# Patient Record
Sex: Female | Born: 1985 | Race: White | Hispanic: No | Marital: Married | Smoking: Former smoker
Health system: Southern US, Community
[De-identification: ages and names within clinical notes are randomized; demographics above are authoritative.]

## PROBLEM LIST (undated history)

## (undated) ENCOUNTER — Inpatient Hospital Stay (HOSPITAL_COMMUNITY): Payer: Self-pay

## (undated) DIAGNOSIS — N390 Urinary tract infection, site not specified: Secondary | ICD-10-CM

## (undated) DIAGNOSIS — M549 Dorsalgia, unspecified: Secondary | ICD-10-CM

## (undated) DIAGNOSIS — O24419 Gestational diabetes mellitus in pregnancy, unspecified control: Secondary | ICD-10-CM

## (undated) DIAGNOSIS — N3090 Cystitis, unspecified without hematuria: Secondary | ICD-10-CM

## (undated) DIAGNOSIS — E559 Vitamin D deficiency, unspecified: Secondary | ICD-10-CM

## (undated) DIAGNOSIS — I639 Cerebral infarction, unspecified: Secondary | ICD-10-CM

## (undated) DIAGNOSIS — Z9889 Other specified postprocedural states: Secondary | ICD-10-CM

## (undated) DIAGNOSIS — K219 Gastro-esophageal reflux disease without esophagitis: Secondary | ICD-10-CM

## (undated) DIAGNOSIS — R42 Dizziness and giddiness: Secondary | ICD-10-CM

## (undated) DIAGNOSIS — R000 Tachycardia, unspecified: Secondary | ICD-10-CM

## (undated) DIAGNOSIS — J302 Other seasonal allergic rhinitis: Secondary | ICD-10-CM

## (undated) DIAGNOSIS — R112 Nausea with vomiting, unspecified: Secondary | ICD-10-CM

## (undated) DIAGNOSIS — E7800 Pure hypercholesterolemia, unspecified: Secondary | ICD-10-CM

## (undated) DIAGNOSIS — R51 Headache: Secondary | ICD-10-CM

## (undated) DIAGNOSIS — I10 Essential (primary) hypertension: Secondary | ICD-10-CM

## (undated) DIAGNOSIS — R202 Paresthesia of skin: Secondary | ICD-10-CM

## (undated) HISTORY — DX: Urinary tract infection, site not specified: N39.0

## (undated) HISTORY — PX: ABDOMINAL HYSTERECTOMY: SHX81

## (undated) HISTORY — DX: Tachycardia, unspecified: R00.0

## (undated) HISTORY — PX: BREAST LUMPECTOMY: SHX2

## (undated) HISTORY — PX: ADENOIDECTOMY: SUR15

## (undated) HISTORY — DX: Dorsalgia, unspecified: M54.9

## (undated) HISTORY — DX: Essential (primary) hypertension: I10

## (undated) HISTORY — PX: SHOULDER SURGERY: SHX246

## (undated) HISTORY — DX: Dizziness and giddiness: R42

## (undated) HISTORY — DX: Cystitis, unspecified without hematuria: N30.90

## (undated) HISTORY — DX: Headache: R51

## (undated) HISTORY — DX: Cerebral infarction, unspecified: I63.9

## (undated) HISTORY — DX: Other specified postprocedural states: R11.2

## (undated) HISTORY — DX: Pure hypercholesterolemia, unspecified: E78.00

## (undated) HISTORY — DX: Other specified postprocedural states: Z98.890

## (undated) HISTORY — DX: Vitamin D deficiency, unspecified: E55.9

## (undated) HISTORY — DX: Paresthesia of skin: R20.2

## (undated) HISTORY — PX: WISDOM TOOTH EXTRACTION: SHX21

---
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
## 2005-01-19 ENCOUNTER — Encounter (INDEPENDENT_AMBULATORY_CARE_PROVIDER_SITE_OTHER): Payer: Self-pay | Admitting: Specialist

## 2005-01-19 ENCOUNTER — Ambulatory Visit (HOSPITAL_BASED_OUTPATIENT_CLINIC_OR_DEPARTMENT_OTHER): Admission: RE | Admit: 2005-01-19 | Discharge: 2005-01-19 | Payer: Self-pay | Admitting: Surgery

## 2005-01-19 ENCOUNTER — Ambulatory Visit (HOSPITAL_COMMUNITY): Admission: RE | Admit: 2005-01-19 | Discharge: 2005-01-19 | Payer: Self-pay | Admitting: Surgery

## 2005-02-16 ENCOUNTER — Ambulatory Visit: Payer: Self-pay | Admitting: Family Medicine

## 2005-04-16 ENCOUNTER — Ambulatory Visit: Payer: Self-pay | Admitting: Internal Medicine

## 2005-06-27 ENCOUNTER — Ambulatory Visit: Payer: Self-pay | Admitting: Family Medicine

## 2005-07-15 ENCOUNTER — Ambulatory Visit: Payer: Self-pay | Admitting: Family Medicine

## 2005-07-16 ENCOUNTER — Emergency Department (HOSPITAL_COMMUNITY): Admission: EM | Admit: 2005-07-16 | Discharge: 2005-07-16 | Payer: Self-pay | Admitting: Emergency Medicine

## 2005-07-17 ENCOUNTER — Ambulatory Visit: Payer: Self-pay | Admitting: Internal Medicine

## 2005-07-17 ENCOUNTER — Ambulatory Visit: Payer: Self-pay | Admitting: Family Medicine

## 2005-07-17 ENCOUNTER — Encounter: Admission: RE | Admit: 2005-07-17 | Discharge: 2005-07-17 | Payer: Self-pay | Admitting: Internal Medicine

## 2005-07-21 ENCOUNTER — Ambulatory Visit: Payer: Self-pay | Admitting: Family Medicine

## 2006-09-07 ENCOUNTER — Ambulatory Visit: Payer: Self-pay | Admitting: Internal Medicine

## 2006-09-07 LAB — CONVERTED CEMR LAB
ALT: 17 units/L (ref 0–40)
AST: 17 units/L (ref 0–37)
Albumin: 3.4 g/dL — ABNORMAL LOW (ref 3.5–5.2)
Alkaline Phosphatase: 80 units/L (ref 39–117)
Amylase: 41 units/L (ref 27–131)
Basophils Absolute: 0.1 10*3/uL (ref 0.0–0.1)
Basophils Relative: 0.7 % (ref 0.0–1.0)
Bilirubin, Direct: 0.1 mg/dL (ref 0.0–0.3)
Eosinophils Absolute: 0.1 10*3/uL (ref 0.0–0.6)
Eosinophils Relative: 0.5 % (ref 0.0–5.0)
HCT: 39.1 % (ref 36.0–46.0)
Hemoglobin: 13.8 g/dL (ref 12.0–15.0)
Lymphocytes Relative: 19.2 % (ref 12.0–46.0)
MCHC: 35.3 g/dL (ref 30.0–36.0)
MCV: 86.8 fL (ref 78.0–100.0)
Monocytes Absolute: 0.7 10*3/uL (ref 0.2–0.7)
Monocytes Relative: 5.8 % (ref 3.0–11.0)
Neutro Abs: 9 10*3/uL — ABNORMAL HIGH (ref 1.4–7.7)
Neutrophils Relative %: 73.8 % (ref 43.0–77.0)
Platelets: 367 10*3/uL (ref 150–400)
RBC: 4.51 M/uL (ref 3.87–5.11)
RDW: 12 % (ref 11.5–14.6)
Total Bilirubin: 0.7 mg/dL (ref 0.3–1.2)
Total Protein: 6.3 g/dL (ref 6.0–8.3)
WBC: 12.2 10*3/uL — ABNORMAL HIGH (ref 4.5–10.5)

## 2006-09-13 ENCOUNTER — Ambulatory Visit (HOSPITAL_COMMUNITY): Admission: RE | Admit: 2006-09-13 | Discharge: 2006-09-13 | Payer: Self-pay | Admitting: Internal Medicine

## 2006-09-27 ENCOUNTER — Ambulatory Visit: Payer: Self-pay | Admitting: Internal Medicine

## 2006-12-06 ENCOUNTER — Ambulatory Visit: Payer: Self-pay | Admitting: Internal Medicine

## 2006-12-17 DIAGNOSIS — G43009 Migraine without aura, not intractable, without status migrainosus: Secondary | ICD-10-CM | POA: Insufficient documentation

## 2006-12-17 DIAGNOSIS — I10 Essential (primary) hypertension: Secondary | ICD-10-CM | POA: Insufficient documentation

## 2007-02-28 ENCOUNTER — Ambulatory Visit: Payer: Self-pay | Admitting: Internal Medicine

## 2007-03-23 ENCOUNTER — Ambulatory Visit: Payer: Self-pay | Admitting: Internal Medicine

## 2007-03-23 LAB — CONVERTED CEMR LAB: Rapid Strep: NEGATIVE

## 2007-03-31 ENCOUNTER — Ambulatory Visit: Payer: Self-pay | Admitting: Internal Medicine

## 2007-03-31 ENCOUNTER — Telehealth: Payer: Self-pay | Admitting: Internal Medicine

## 2007-04-01 LAB — CONVERTED CEMR LAB
Basophils Absolute: 0 10*3/uL (ref 0.0–0.1)
Basophils Relative: 0.1 % (ref 0.0–1.0)
Eosinophils Absolute: 0.2 10*3/uL (ref 0.0–0.6)
Eosinophils Relative: 2.2 % (ref 0.0–5.0)
HCT: 40.3 % (ref 36.0–46.0)
Hemoglobin: 14.2 g/dL (ref 12.0–15.0)
Lymphocytes Relative: 21.4 % (ref 12.0–46.0)
MCHC: 35.2 g/dL (ref 30.0–36.0)
MCV: 85.8 fL (ref 78.0–100.0)
Monocytes Absolute: 0.6 10*3/uL (ref 0.2–0.7)
Monocytes Relative: 7.1 % (ref 3.0–11.0)
Neutro Abs: 5.9 10*3/uL (ref 1.4–7.7)
Neutrophils Relative %: 69.2 % (ref 43.0–77.0)
Platelets: 275 10*3/uL (ref 150–400)
RBC: 4.7 M/uL (ref 3.87–5.11)
RDW: 11.2 % — ABNORMAL LOW (ref 11.5–14.6)
WBC: 8.5 10*3/uL (ref 4.5–10.5)

## 2007-04-20 ENCOUNTER — Other Ambulatory Visit: Admission: RE | Admit: 2007-04-20 | Discharge: 2007-04-20 | Payer: Self-pay | Admitting: Obstetrics and Gynecology

## 2007-06-07 ENCOUNTER — Ambulatory Visit: Payer: Self-pay | Admitting: Family Medicine

## 2007-06-18 ENCOUNTER — Emergency Department (HOSPITAL_COMMUNITY): Admission: EM | Admit: 2007-06-18 | Discharge: 2007-06-18 | Payer: Self-pay | Admitting: Emergency Medicine

## 2007-07-28 ENCOUNTER — Encounter: Admission: RE | Admit: 2007-07-28 | Discharge: 2007-07-28 | Payer: Self-pay | Admitting: Orthopedic Surgery

## 2007-11-22 ENCOUNTER — Ambulatory Visit: Payer: Self-pay | Admitting: Internal Medicine

## 2008-02-06 ENCOUNTER — Telehealth: Payer: Self-pay | Admitting: Internal Medicine

## 2008-05-17 ENCOUNTER — Telehealth: Payer: Self-pay | Admitting: *Deleted

## 2008-05-21 ENCOUNTER — Ambulatory Visit: Payer: Self-pay | Admitting: Family Medicine

## 2008-06-06 ENCOUNTER — Ambulatory Visit: Payer: Self-pay | Admitting: Family Medicine

## 2008-06-18 ENCOUNTER — Other Ambulatory Visit: Admission: RE | Admit: 2008-06-18 | Discharge: 2008-06-18 | Payer: Self-pay | Admitting: Obstetrics and Gynecology

## 2008-09-28 ENCOUNTER — Ambulatory Visit: Payer: Self-pay | Admitting: Family Medicine

## 2008-10-09 ENCOUNTER — Ambulatory Visit: Payer: Self-pay | Admitting: Family Medicine

## 2008-11-29 ENCOUNTER — Telehealth: Payer: Self-pay | Admitting: Internal Medicine

## 2008-11-29 ENCOUNTER — Ambulatory Visit: Payer: Self-pay | Admitting: Internal Medicine

## 2009-01-09 ENCOUNTER — Ambulatory Visit: Payer: Self-pay | Admitting: Internal Medicine

## 2009-07-01 ENCOUNTER — Encounter (INDEPENDENT_AMBULATORY_CARE_PROVIDER_SITE_OTHER): Payer: Self-pay | Admitting: *Deleted

## 2009-07-04 ENCOUNTER — Ambulatory Visit: Payer: Self-pay | Admitting: Family Medicine

## 2009-07-19 ENCOUNTER — Ambulatory Visit: Payer: Self-pay | Admitting: Internal Medicine

## 2009-08-02 ENCOUNTER — Other Ambulatory Visit: Admission: RE | Admit: 2009-08-02 | Discharge: 2009-08-02 | Payer: Self-pay | Admitting: Obstetrics and Gynecology

## 2009-08-06 ENCOUNTER — Ambulatory Visit: Payer: Self-pay | Admitting: Internal Medicine

## 2009-08-09 ENCOUNTER — Ambulatory Visit (HOSPITAL_COMMUNITY): Admission: RE | Admit: 2009-08-09 | Discharge: 2009-08-09 | Payer: Self-pay | Admitting: Internal Medicine

## 2009-08-09 ENCOUNTER — Telehealth: Payer: Self-pay | Admitting: Internal Medicine

## 2009-08-19 ENCOUNTER — Encounter (INDEPENDENT_AMBULATORY_CARE_PROVIDER_SITE_OTHER): Payer: Self-pay | Admitting: *Deleted

## 2009-08-20 ENCOUNTER — Ambulatory Visit: Payer: Self-pay | Admitting: Internal Medicine

## 2009-08-27 ENCOUNTER — Ambulatory Visit: Payer: Self-pay | Admitting: Internal Medicine

## 2009-09-16 ENCOUNTER — Ambulatory Visit: Payer: Self-pay | Admitting: Internal Medicine

## 2009-11-11 ENCOUNTER — Ambulatory Visit: Payer: Self-pay | Admitting: Family Medicine

## 2010-04-01 ENCOUNTER — Ambulatory Visit: Payer: Self-pay | Admitting: Internal Medicine

## 2010-04-01 DIAGNOSIS — J069 Acute upper respiratory infection, unspecified: Secondary | ICD-10-CM | POA: Insufficient documentation

## 2010-04-03 ENCOUNTER — Telehealth: Payer: Self-pay | Admitting: Internal Medicine

## 2010-06-04 ENCOUNTER — Telehealth (INDEPENDENT_AMBULATORY_CARE_PROVIDER_SITE_OTHER): Payer: Self-pay | Admitting: *Deleted

## 2010-06-05 ENCOUNTER — Telehealth: Payer: Self-pay | Admitting: Family Medicine

## 2010-06-05 ENCOUNTER — Ambulatory Visit
Admission: RE | Admit: 2010-06-05 | Discharge: 2010-06-05 | Payer: Self-pay | Source: Home / Self Care | Attending: Family Medicine | Admitting: Family Medicine

## 2010-06-05 LAB — CONVERTED CEMR LAB
Bilirubin Urine: NEGATIVE
Ketones, urine, test strip: NEGATIVE
Protein, U semiquant: NEGATIVE
Urobilinogen, UA: 0.2

## 2010-06-08 HISTORY — PX: BACK SURGERY: SHX140

## 2010-07-08 NOTE — Letter (Signed)
Summary: EGD Instructions  [facility]  [address] [place], [place] [address]   Phone: [phone]  Fax: [phone]       Tonya [name]    June 04, 1986    MRN: [id number]       Procedure Day Dorna Bloom:  Jake Shark  08/27/09     Arrival Time:   2:30PM     Procedure Time:  3:30PM     Location of Procedure:                    [facility] (4th Floor)    PREPARATION FOR ENDOSCOPY   On 08/27/09 THE DAY OF THE PROCEDURE:  1.   No solid foods, milk or milk products are allowed after midnight the night before your procedure.  2.   Do not drink anything colored red or purple.  Avoid juices with pulp.  No orange juice.  3.  You may drink clear liquids until 1:30PM, which is 2 hours before your procedure.                                                                                                CLEAR LIQUIDS INCLUDE: Water Jello Ice Popsicles Tea (sugar ok, no milk/cream) Powdered fruit flavored drinks Coffee (sugar ok, no milk/cream) Gatorade Juice: apple, white grape, white cranberry  Lemonade Clear bullion, consomm, broth Carbonated beverages (any kind) Strained chicken noodle soup Hard Candy   MEDICATION INSTRUCTIONS  Unless otherwise instructed, you should take regular prescription medications with a small sip of water as early as possible the morning of your procedure.           OTHER INSTRUCTIONS  You will need a responsible adult at least 25 years of age to accompany you and drive you home.   This person must remain in the waiting room during your procedure.  Wear loose fitting clothing that is easily removed.  Leave jewelry and other valuables at home.  However, you may wish to bring a book to read or an iPod/MP3 player to listen to music as you wait for your procedure to start.  Remove all body piercing jewelry and leave at home.  Total time from sign-in until discharge is approximately 2-3 hours.  You should go home  directly after your procedure and rest.  You can resume normal activities the day after your procedure.  The day of your procedure you should not:   Drive   Make legal decisions   Operate machinery   Drink alcohol   Return to work  You will receive specific instructions about eating, activities and medications before you leave.    The above instructions have been reviewed and explained to me by   Wyona Almas RN  August 20, 2009 4:40 PM     I fully understand and can verbalize these instructions _____________________________ Date _________

## 2010-07-08 NOTE — Progress Notes (Signed)
  Phone Note Call from Patient   Caller: Patient Call For: Birdie Sons MD Summary of Call: Is feeling worse, with congestion in chest and head with weakness.  Needs antibiotics to [facility] in [place].  No fever. [phone] Initial call taken by: Lynann Beaver CMA AAMA,  April 03, 2010 9:38 AM  Follow-up for Phone Call        Pt called again. Follow-up by: Warnell Forester,  April 03, 2010 1:06 PM  Additional Follow-up for Phone Call Additional follow up Details #1::        see meds stop doxycycline while on new ABX Additional Follow-up by: Birdie Sons MD,  April 03, 2010 1:31 PM    Additional Follow-up for Phone Call Additional follow up Details #2::    septra will cover the acne problem too Follow-up by: Birdie Sons MD,  April 03, 2010 7:41 PM  New/Updated Medications: SEPTRA DS 800-160 MG TABS (SULFAMETHOXAZOLE-TRIMETHOPRIM) 1 by mouth twice daily stop doxycycline while on septra Prescriptions: SEPTRA DS 800-160 MG TABS (SULFAMETHOXAZOLE-TRIMETHOPRIM) 1 by mouth twice daily stop doxycycline while on septra  #14 x 0   Entered and Authorized by:   Birdie Sons MD   Signed by:   Birdie Sons MD on 04/03/2010   Method used:   Electronically to        [facility] (retail)       [address]       [place], [place]  [address]       Ph: [phone]       Fax: [phone]   RxID:   [id number]  Pt does not want to stop Doxycycline due to acne.  She will decide what she wants to do.

## 2010-07-08 NOTE — Assessment & Plan Note (Signed)
Summary: cough/chest congestion/stuffy nose/sore throat/nausea/cjr   Vital Signs:  Patient profile:   25 year old female Temp:     99.0 degrees F oral  Vitals Entered By: Sid Falcon LPN (July 04, 2009 1:44 PM) CC: Cough, congestion, dry throat, bil ear fullness   History of Present Illness: Acute visit. Two day history cough which is mostly dry and nasal congestion. Mild bilateral earache. Some nausea possibly due to postnasal drainage but no vomiting. Denies any definite fever. No chills.  Preventive Screening-Counseling & Management  Alcohol-Tobacco     Smoking Status: never  Allergies: 1)  * Green Dyes  Past History:  Past Medical History: Last updated: 11/29/2008 Hypertension migraine headaches Low back pain  Past Surgical History: Last updated: 12/17/2006 cyst L breast  2006 Shoulder surgery  Social History: Last updated: 02/28/2007 Single Never Smoked Regular exercise-no  Review of Systems      See HPI  Physical Exam  General:  Well-developed,well-nourished,in no acute distress; alert,appropriate and cooperative throughout examination Ears:  she has a little bit of visible clear fluid behind the left eardrum but no erythema and no distortion of landmarks Nose:  clear rhinorrhea Mouth:  Oral mucosa and oropharynx without lesions or exudates.  Teeth in good repair. Neck:  No deformities, masses, or tenderness noted. Lungs:  Normal respiratory effort, chest expands symmetrically. Lungs are clear to auscultation, no crackles or wheezes. Heart:  Normal rate and regular rhythm. S1 and S2 normal without gallop, murmur, click, rub or other extra sounds.   Impression & Recommendations:  Problem # 1:  VIRAL URI (ICD-465.9) refill cough med for as needed use. Her updated medication list for this problem includes:    Hydrocodone-homatropine 5-1.5 Mg/12ml Syrp (Hydrocodone-homatropine) ..... One tsp by mouth q 4-6 hours as needed cough  Complete Medication  List: 1)  Nuvaring 0.12-0.015 Mg/24hr Ring (Etonogestrel-ethinyl estradiol) .... Once monthly 2)  Hydrochlorothiazide 12.5 Mg Caps (Hydrochlorothiazide) .... Once daily 3)  Hydrocodone-homatropine 5-1.5 Mg/45ml Syrp (Hydrocodone-homatropine) .... One tsp by mouth q 4-6 hours as needed cough  Patient Instructions: 1)  Get plenty of rest, drink lots of clear liquids, and use Tylenol or Ibuprofen for fever and comfort. Return in 7-10 days if you're not better: sooner if you'er feeling worse.  Prescriptions: HYDROCODONE-HOMATROPINE 5-1.5 MG/5ML SYRP (HYDROCODONE-HOMATROPINE) one tsp by mouth q 4-6 hours as needed cough  #120 ml x 0   Entered and Authorized by:   Evelena Peat MD   Signed by:   Evelena Peat MD on 07/04/2009   Method used:   Print then Give to Patient   RxID:   [id number]

## 2010-07-08 NOTE — Assessment & Plan Note (Signed)
Summary: VOMITING--CH.    History of Present Illness Visit Type: Initial Visit Primary GI MD: Stan Head MD [facility] Primary Provider: Birdie Sons, MD Chief Complaint: .  History of Present Illness:   25 year old white woman with complaints of chest & epigastric pain, which has been going on for three years. She has some dysphagia when this pain occurs. the episodes are intermittent and unpredictable, and may last for up to an hour. When they occur she can't swallow liquids or perhaps even her saliva. The last 3 or Times or so she took Motrin or Advil before it happen. She's had problems over the last 3 years. She describes the chest pain is like a squeezing in the chest the chest will not let up. 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 An acid reducer medication, H2 blocker has not helped and Zegerid taken for one month did not help. Prior primary care records are reviewed.  All other GI ROS negative.          Preventive Screening-Counseling & Management      Drug Use:  no.      Current Medications (verified): 1)  Nuvaring 0.12-0.015 Mg/24hr  Ring (Etonogestrel-Ethinyl Estradiol) .... Once Monthly 2)  Hydrochlorothiazide 12.5 Mg Caps (Hydrochlorothiazide) .... Once Daily 3)  Fish Oil 1000 Mg Caps (Omega-3 Fatty Acids) .... Take One By Mouth Once Daily 4)  Tylenol Extra Strength 500 Mg Tabs (Acetaminophen) .... 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  Past Surgical History: cyst L breast  2006 Shoulder surgery left  Family History: Family History of Colon Cancer: paternal great grandmother Family History of Colon Polyps: paternal grandmother Family History of Breast Cancer:paternal great grandmother Family History of Diabetes: maternal grandfather, paternal  uncle Family History of Heart Disease: paternal great grandmother Family History of Inflammatory Bowel Disease: paternal grandmother  Social History: Single Never Smoked Regular exercise-no Occupation: International aid/development worker company Alcohol Use - no Illicit Drug Use - no Drug Use:  no  Review of Systems       All other ROS negative except as per HPI.   Vital Signs:  Patient profile:   25 year old female Height:      66 inches Weight:      202.6 pounds BMI:     32.82 Pulse rate:   84 / minute Pulse rhythm:   regular BP sitting:   130 / 80  (left arm) Cuff size:   regular  Vitals Entered By: Harlow Mares CMA Duncan Dull) (August 06, 2009 9:06 AM)  Physical Exam  General:  alert and well-developed.  obese.   Eyes:  PERRLA, no icterus. Mouth:  No deformity or lesions, dentition normal. Neck:  Supple; no masses or thyromegaly. Lungs:  Clear throughout to auscultation. Heart:  Regular rate and rhythm; no murmurs, rubs,  or bruits. Abdomen:  Soft, nontender and nondistended. No masses, hepatosplenomegaly or hernias noted. Normal bowel sounds. Extremities:  No clubbing, cyanosis, edema or deformities noted. Neurologic:  Alert and  oriented x4 Cervical Nodes:  No significant cervical or supraclavicular adenopathy.  Psych:  Alert and cooperative. Normal mood and affect.   Impression & Recommendations:  Problem # 1:  CHEST PAIN (ICD-786.50) Assessment New ? GERD, esophageal dysmotility, other  causes. One month of Zegerid did not help.  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  Orders: Barium Swallow with Tablet (BS w/tab)  Problem # 2:  DYSPHAGIA (ICD-787.29) Assessment: New ? dysmotility, hx favors that over stricture  Orders: Barium Swallow with Tablet (BS w/tab)  Patient Instructions: 1)  Your procedure has been scheduled for 08/08/09 @ [facility].  Please see  seperate instructions. 2)  We will call you with results and further follow up instructions. 3)  Copy sent to : Birdie Sons, MD 4)  The medication list was reviewed and reconciled.  All changed / newly prescribed medications were explained.  A complete medication list was provided to the patient / caregiver.

## 2010-07-08 NOTE — Assessment & Plan Note (Signed)
Summary: congestion//ccm   Vital Signs:  Patient profile:   25 year old female Weight:      194 pounds Temp:     98.7 degrees F oral BP sitting:   122 / 82  (left arm) Cuff size:   large  Vitals Entered By: Alfred Levins, CMA (April 01, 2010 9:18 AM) CC: cough, sinus and bilateral ear pain, st x2 days   Primary Care Provider:  Birdie Sons, MD  CC:  cough, sinus and bilateral ear pain, and st x2 days.  History of Present Illness: one day hx of illness started yesterday a.m. with ST then developed ear pressure this a.m. developed cough and nausea has some facial pressure no fever or chills  cough is mostly non productive  All other systems reviewed and were negative   Current Problems (verified): 1)  Common Migraine  (ICD-346.10) 2)  Hypertension  (ICD-401.9)  Current Medications (verified): 1)  Nuvaring 0.12-0.015 Mg/24hr  Ring (Etonogestrel-Ethinyl Estradiol) .... Once Monthly 2)  Hydrochlorothiazide 12.5 Mg Caps (Hydrochlorothiazide) .... Once Daily 3)  Tylenol Extra Strength 500 Mg Tabs (Acetaminophen) .... Take One By Mouth As Needed 4)  Flovent Hfa 220 Mcg/act Aero (Fluticasone Propionate  Hfa) .... 2 Puffs Two Times A Day No Spacer,  Swallow Puffs, Sip  Small Amount  Water After  Puffs, Rinse Mouthafter   No Food/drink X 30 Mins. 5)  Omeprazole 40 Mg Cpdr (Omeprazole) .Marland Kitchen.. 1 By Mouth Once Daily 30-60 Minutes Before Breakfast 6)  Phentermine Hcl 37.5 Mg Caps (Phentermine Hcl) .... 1/2 By Mouth Once Daily 7)  Doxycycline Hyclate 100 Mg Caps (Doxycycline Hyclate) .... 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  Past Surgical History: Last updated: 08/06/2009 cyst L breast  2006 Shoulder surgery left  Family History: Last updated: 08/06/2009 Family History of Colon Cancer: paternal great  grandmother Family History of Colon Polyps: paternal grandmother Family History of Breast Cancer:paternal great grandmother Family History of Diabetes: maternal grandfather, paternal uncle Family History of Heart Disease: paternal great grandmother Family History of Inflammatory Bowel Disease: paternal grandmother  Social History: Last updated: 08/06/2009 Single Never Smoked Regular exercise-no Occupation: International aid/development worker company Alcohol Use - no Illicit Drug Use - no  Risk Factors: Exercise: no (02/28/2007)  Risk Factors: Smoking Status: never (09/16/2009)  Physical Exam  General:  alert and well-developed.   Eyes:  pupils equal and pupils round.   Mouth:  good dentition and pharynx pink and moist.   Lungs:  Normal respiratory effort, chest expands symmetrically. Lungs are clear to auscultation, no crackles or wheezes. Heart:  Normal rate and regular rhythm. S1 and S2 normal without gallop, murmur, click, rub or other extra sounds.   Impression & Recommendations:  Problem # 1:  URI (ICD-465.9) no evidence of bacterial infection. call for any concerns, increased sxs, fever, persistence of sxs, wheeze, SOB.   will treat symptomatically with Hydromet and Mucinex DM. She understands call if symptoms worsen.  Complete Medication List: 1)  Nuvaring 0.12-0.015 Mg/24hr Ring (Etonogestrel-ethinyl estradiol) .... Once monthly 2)  Hydrochlorothiazide 12.5 Mg Caps (Hydrochlorothiazide) .... Once daily 3)  Tylenol Extra Strength 500 Mg Tabs (Acetaminophen) .... Take one by mouth as needed 4)  Flovent Hfa 220 Mcg/act Aero (Fluticasone propionate  hfa) .... 2 puffs two times a day no spacer,  swallow puffs, sip  small  amount  water after  puffs, rinse mouthafter   no food/drink x 30 mins. 5)  Omeprazole 40 Mg Cpdr (Omeprazole) .Marland Kitchen.. 1 by mouth once daily 30-60 minutes before breakfast 6)  Phentermine Hcl 37.5 Mg Caps (Phentermine hcl) .... 1/2 by mouth once  daily 7)  Doxycycline Hyclate 100 Mg Caps (Doxycycline hyclate) .... Take 1 tab twice a day 8)  Hydromet 5-1.5 Mg/45ml Syrp (Hydrocodone-homatropine) .Marland Kitchen.. 1 tsp three times a day as needed cough. Prescriptions: HYDROMET 5-1.5 MG/5ML SYRP (HYDROCODONE-HOMATROPINE) 1 tsp three times a day as needed cough.  #120 cc x 0   Entered and Authorized by:   Birdie Sons MD   Signed by:   Birdie Sons MD on 04/01/2010   Method used:   Print then Give to Patient   RxID:   [id number]    Orders Added: 1)  Est. Patient Level III [56213]

## 2010-07-08 NOTE — Miscellaneous (Signed)
Summary: [facility] Previsit/prep  Clinical Lists Changes  Observations: Added new observation of ALLERGY REV: Done (08/20/2009 15:44)

## 2010-07-08 NOTE — Assessment & Plan Note (Signed)
Summary: congestion//ccm   Vital Signs:  Patient profile:   25 year old female Temp:     98.7 degrees F oral Pulse rate:   82 / minute Pulse rhythm:   regular Resp:     14 per minute BP sitting:   148 / 90  (left arm) Cuff size:   regular  Vitals Entered By: Gladis Riffle, RN (September 16, 2009 10:02 AM) CC: c/o cough, congestion x 4 days, went to urgent care 09/14/09 and told has pneumonia,  could not afford levaquin Is Patient Diabetic? No   Primary Care Provider:  Birdie Sons, MD  CC:  c/o cough, congestion x 4 days, went to urgent care 09/14/09 and told has pneumonia, and could not afford levaquin.  History of Present Illness: URI sxs for 5 days some sinus congestion some ear pressure no fever or chills All other systems reviewed and were negative   Preventive Screening-Counseling & Management  Alcohol-Tobacco     Smoking Status: never  Current Problems (verified): 1)  Common Migraine  (ICD-346.10) 2)  Hypertension  (ICD-401.9)  Current Medications (verified): 1)  Nuvaring 0.12-0.015 Mg/24hr  Ring (Etonogestrel-Ethinyl Estradiol) .... Once Monthly 2)  Hydrochlorothiazide 12.5 Mg Caps (Hydrochlorothiazide) .... Once Daily 3)  Fish Oil 1000 Mg Caps (Omega-3 Fatty Acids) .... Take One By Mouth Once Daily 4)  Tylenol Extra Strength 500 Mg Tabs (Acetaminophen) .... Take One By Mouth As Needed 5)  Flovent Hfa 220 Mcg/act Aero (Fluticasone Propionate  Hfa) .... 2 Puffs Two Times A Day No Spacer,  Swallow Puffs, Sip  Small Amount  Water After  Puffs, Rinse Mouthafter   No Food/drink X 30 Mins. 6)  Omeprazole 40 Mg Cpdr (Omeprazole) .Marland Kitchen.. 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  Past Surgical History: Last updated: 08/06/2009 cyst L breast  2006 Shoulder surgery left  Family  History: Last updated: 08/06/2009 Family History of Colon Cancer: paternal great grandmother Family History of Colon Polyps: paternal grandmother Family History of Breast Cancer:paternal great grandmother Family History of Diabetes: maternal grandfather, paternal uncle Family History of Heart Disease: paternal great grandmother Family History of Inflammatory Bowel Disease: paternal grandmother  Social History: Last updated: 08/06/2009 Single Never Smoked Regular exercise-no Occupation: International aid/development worker company Alcohol Use - no Illicit Drug Use - no  Risk Factors: Exercise: no (02/28/2007)  Risk Factors: Smoking Status: never (09/16/2009)  Review of Systems       All other systems reviewed and were negative   Physical Exam  General:  alert and well-developed.   Head:  normocephalic and atraumatic.   Eyes:  pupils equal and pupils round.   Ears:  retracted Tms bilaterally Neck:  No deformities, masses, or tenderness noted. Lungs:  normal respiratory effort and no intercostal retractions.   Heart:  normal rate and regular rhythm.   Pulses:  R radial normal and L radial normal.   Neurologic:  cranial nerves II-XII intact and gait normal.     Impression & Recommendations:  Problem # 1:  URI (ICD-465.9) no evidence of bacterial infection. call for any concerns, increased sxs, fever, persistence of sxs, wheeze, SOB.   mucinex cm two times a day  Her updated medication list for this problem includes:    Tylenol Extra Strength 500 Mg Tabs (Acetaminophen) .Marland Kitchen... Take one by mouth as needed  Hydromet 5-1.5 Mg/44ml Syrp (Hydrocodone-homatropine) .Marland Kitchen... 1 tsp three times a day as needed  Complete Medication List: 1)  Nuvaring 0.12-0.015 Mg/24hr Ring (Etonogestrel-ethinyl estradiol) .... Once monthly 2)  Hydrochlorothiazide 12.5 Mg Caps (Hydrochlorothiazide) .... Once daily 3)  Fish Oil 1000 Mg Caps (Omega-3 fatty acids) .... Take one by mouth once  daily 4)  Tylenol Extra Strength 500 Mg Tabs (Acetaminophen) .... Take one by mouth as needed 5)  Flovent Hfa 220 Mcg/act Aero (Fluticasone propionate  hfa) .... 2 puffs two times a day no spacer,  swallow puffs, sip  small amount  water after  puffs, rinse mouthafter   no food/drink x 30 mins. 6)  Omeprazole 40 Mg Cpdr (Omeprazole) .Marland Kitchen.. 1 by mouth once daily 30-60 minutes before breakfast 7)  Hydromet 5-1.5 Mg/9ml Syrp (Hydrocodone-homatropine) .Marland Kitchen.. 1 tsp three times a day as needed Prescriptions: HYDROMET 5-1.5 MG/5ML SYRP (HYDROCODONE-HOMATROPINE) 1 tsp three times a day as needed  #120 cc x 0   Entered and Authorized by:   Birdie Sons MD   Signed by:   Birdie Sons MD on 09/16/2009   Method used:   Print then Give to Patient   RxID:   [id number]

## 2010-07-08 NOTE — Procedures (Signed)
Summary: Upper Endoscopy  Patient: Tonya [name] Note: All result statuses are Final unless otherwise noted.  Tests: (1) Upper Endoscopy (EGD)   EGD Upper Endoscopy       DONE     [facility]     [address]     [place], [place]  [address]           ENDOSCOPY PROCEDURE REPORT           PATIENT:  Tonya [name], Tonya [name]  MR#:  [id number]     BIRTHDATE:  07-14-1985, 23 yrs. old  GENDER:  female           ENDOSCOPIST:  Iva Boop, MD, [facility]           PROCEDURE DATE:  08/27/2009     PROCEDURE:  EGD with biopsy     ASA CLASS:  Class I     INDICATIONS:  dysphagia, chest pain barium swllow with reflux but     tablet passed and no stricture     she has episodic chest pain associated with dysphagia symptoms           MEDICATIONS:   Fentanyl 75 mcg IV, Versed 8 mg IV     TOPICAL ANESTHETIC:  Exactacain Spray           DESCRIPTION OF PROCEDURE:   After the risks benefits and     alternatives of the procedure were thoroughly explained, informed     consent was obtained.  The [id number] endoscope was     introduced through the mouth and advanced to the second portion of     the duodenum, without limitations.  The instrument was slowly     withdrawn as the mucosa was fully examined.     <<PROCEDUREIMAGES>>           Abnormal appearing mucosa in the total esophagus. Longitudinal     fissures and tiny white plaques. This could represent eosinophilic     esophagitis. Multiple biopsies were obtained and sent to     pathology.  Multiple erosions were found in the distal esophagus.     Several up to 1 cm long in distal esophagus just above Z-line (35     cm). Multiple biopsies were obtained and sent to pathology.     Duodenitis was found in the bulb of the duodenum. Mild with     punctate red spots and granularity of the mucosa.  Otherwise the     examination was normal.    Retroflexed views revealed no     abnormalities.    The scope was then withdrawn from the patient     and  the procedure completed.           COMPLICATIONS:  None           ENDOSCOPIC IMPRESSION:     1) Abnormal mucosa in the total esophagus - fissures and tiny     white plaques that could be Eosinophilic Esophagitis - biopsied     2) Erosions, multiple in the distal esophagus - biopsied     3) Duodenitis in the bulb of duodenum     4) Otherwise normal examination           RECOMMENDATIONS:     Dexilant 60 mg 30-60 minutes before breakfast. Samples provided     from the office           REPEAT EXAM:  as needed  Iva Boop, MD, [facility]           CC:  Lindley Magnus, MD     The Patient           n.     Rosalie Doctor:   Iva Boop at 08/27/2009 04:17 PM           Joneen Boers, [id number]  Note: An exclamation mark (!) indicates a result that was not dispersed into the flowsheet. Document Creation Date: 08/27/2009 4:17 PM _______________________________________________________________________  (1) Order result status: Final Collection or observation date-time: 08/27/2009 16:01 Requested date-time:  Receipt date-time:  Reported date-time:  Referring Physician:   Ordering Physician: Stan Head [phone]) Specimen Source:  Source: Launa Grill Order Number: [id number] Lab site:

## 2010-07-08 NOTE — Letter (Signed)
Summary: New Patient letter  [facility]  [address] [place], [place] [address]   Phone: [phone]  Fax: [phone]       07/01/2009 MRN: [id number]  [facility] [address] [place], [place]  [address]  Dear Ms. HILL,  Welcome to the Gastroenterology Division at [facility].    You are scheduled to see Dr. Leone Payor on 08-06-09 at 9:00a.m. on the 3rd floor at [facility], [address]  We ask that you try to arrive at our office 15 minutes prior to your appointment time to allow for check-in.  We would like you to complete the enclosed self-administered evaluation form prior to your visit and bring it with you on the day of your appointment.  We will review it with you.  Also, please bring a complete list of all your medications or, if you prefer, bring the medication bottles and we will list them.  Please bring your insurance card so that we may make a copy of it.  If your insurance requires a referral to see a specialist, please bring your referral form from your primary care physician.  Co-payments are due at the time of your visit and may be paid by cash, check or credit card.     Your office visit will consist of a consult with your physician (includes a physical exam), any laboratory testing he/she may order, scheduling of any necessary diagnostic testing (e.g. x-ray, ultrasound, CT-scan), and scheduling of a procedure (e.g. Endoscopy, Colonoscopy) if required.  Please allow enough time on your schedule to allow for any/all of these possibilities.    If you cannot keep your appointment, please call [phone] to cancel or reschedule prior to your appointment date.  This allows [place] the opportunity to schedule an appointment for another patient in need of care.  If you do not cancel or reschedule by 5 p.m. the business day prior to your appointment date, you will be charged a $50.00 late cancellation/no-show fee.    Thank you for choosing [facility] for your medical needs.  We appreciate the opportunity to care for you.  Please visit [place] at our website  to learn more about our practice.                     Sincerely,                                                             The Gastroenterology Division

## 2010-07-08 NOTE — Assessment & Plan Note (Signed)
Summary: fu on bp/njr  10am   Vital Signs:  Tonya [name] profile:   25 year old female Weight:      204 pounds Temp:     98.4 degrees F Pulse rate:   88 / minute Resp:     12 per minute BP sitting:   130 / 88  (left arm) Cuff size:   regular  Vitals Entered By: Gladis Riffle, RN (July 19, 2009 10:17 AM)  Serial Vital Signs/Assessments:  Time      Position  BP       Pulse  Resp  Temp     By                     109/32                         Birdie Sons MD  CC: FU BP--put on HCTZ at bariatric clinic for BP 140/100 one month ago, now to follow with pcp Is Tonya [name] Diabetic? No   CC:  FU BP--put on HCTZ at bariatric clinic for BP 140/100 one month ago and now to follow with pcp.  Preventive Screening-Counseling & Management  Alcohol-Tobacco     Smoking Status: never  Current Problems (verified): 1)  Common Migraine  (ICD-346.10) 2)  Hypertension  (ICD-401.9)  Current Medications (verified): 1)  Nuvaring 0.12-0.015 Mg/24hr  Ring (Etonogestrel-Ethinyl Estradiol) .... Once Monthly 2)  Hydrochlorothiazide 12.5 Mg Caps (Hydrochlorothiazide) .... Once Daily  Allergies: 1)  * Green Dyes  Review of Systems       All other systems reviewed and were negative   Physical Exam  General:  alert and well-developed.   Head:  normocephalic and atraumatic.   Eyes:  pupils equal and pupils round.   Ears:  R ear normal and L ear normal.   Neck:  No deformities, masses, or tenderness noted. Chest Wall:  nontender Lungs:  Normal respiratory effort, chest expands symmetrically. Lungs are clear to auscultation, no crackles or wheezes. Abdomen:  Bowel sounds positive,abdomen soft and non-tender without masses, organomegaly or hernias noted. Pulses:  R radial normal and L radial normal.   Neurologic:  cranial nerves II-XII intact and gait normal.     Impression & Recommendations:  Problem # 1:  HYPERTENSION (ICD-401.9) better controlled continue current medications  see me 3  months suspect with weight loss she will get off meds Her updated medication list for this problem includes:    Hydrochlorothiazide 12.5 Mg Caps (Hydrochlorothiazide) ..... Once daily  BP today: 130/88 Prior BP: 136/90 (01/09/2009)  Complete Medication List: 1)  Nuvaring 0.12-0.015 Mg/24hr Ring (Etonogestrel-ethinyl estradiol) .... Once monthly 2)  Hydrochlorothiazide 12.5 Mg Caps (Hydrochlorothiazide) .... Once daily  Tonya [name] Instructions: 1)  Please schedule a follow-up appointment in 3 months. Prescriptions: HYDROCHLOROTHIAZIDE 12.5 MG CAPS (HYDROCHLOROTHIAZIDE) once daily  #90 x 3   Entered and Authorized by:   Birdie Sons MD   Signed by:   Birdie Sons MD on 07/19/2009   Method used:   Electronically to        [facility] (retail)       [address]       [place], [place]  [address]       Ph: [phone]       Fax: [phone]   RxID:   [id number]

## 2010-07-08 NOTE — Assessment & Plan Note (Signed)
Summary: sinuses//ccm   Vital Signs:  Patient profile:   25 year old female Temp:     98.8 degrees F oral BP sitting:   130 / 86  (left arm) Cuff size:   regular  Vitals Entered By: Sid Falcon LPN (November 12, 8411 4:43 PM) CC: sore throat, cough   History of Present Illness: Onset last Tuesday of severe sore throat.  Past few days has developed prod cough. No hx asthma.  Uses Flovent but for eosinophilic esophagitis. No fever.  Minimal nasal congestion.  Tylenol with moderate relief of sore throat sxs.  Cough esp bothersome at night.  Hydromet with relief.  OTC meds have not helped. Nonsmoker.  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  Otoscopic examination reveals clear canals, tympanic membranes are intact bilaterally without bulging, retraction, inflammation or discharge. Hearing is grossly normal bilaterally. Mouth:  minimal erythema. Neck:  No deformities, masses, or tenderness noted. Lungs:  Normal respiratory effort, chest expands symmetrically. Lungs are clear to auscultation, no crackles or wheezes. Heart:  normal rate, regular rhythm, and no murmur.     Impression & Recommendations:  Problem # 1:  URI (ICD-465.9) suspect viral.  Rapid strep neg.  Refilled Hydromet. Her updated medication list for this problem includes:    Tylenol Extra Strength 500 Mg Tabs (Acetaminophen) .Marland Kitchen... Take one by mouth as needed    Hydromet 5-1.5 Mg/58ml Syrp (Hydrocodone-homatropine) .Marland Kitchen... 1 tsp three times a day as needed  Complete Medication List: 1)  Nuvaring 0.12-0.015 Mg/24hr Ring (Etonogestrel-ethinyl  estradiol) .... Once monthly 2)  Hydrochlorothiazide 12.5 Mg Caps (Hydrochlorothiazide) .... Once daily 3)  Fish Oil 1000 Mg Caps (Omega-3 fatty acids) .... Take one by mouth once daily 4)  Tylenol Extra Strength 500 Mg Tabs (Acetaminophen) .... Take one by mouth as needed 5)  Flovent Hfa 220 Mcg/act Aero (Fluticasone propionate  hfa) .... 2 puffs two times a day no spacer,  swallow puffs, sip  small amount  water after  puffs, rinse mouthafter   no food/drink x 30 mins. 6)  Omeprazole 40 Mg Cpdr (Omeprazole) .Marland Kitchen.. 1 by mouth once daily 30-60 minutes before breakfast 7)  Hydromet 5-1.5 Mg/44ml Syrp (Hydrocodone-homatropine) .Marland Kitchen.. 1 tsp three times a day as needed  Other Orders: Rapid Strep (24401)  Patient Instructions: 1)  Acute Bronchitis symptoms for less then 10 days are not  helped by antibiotics. Take over the counter cough medications. Call if no improvement in 5-7 days, sooner if increasing cough, fever, or new symptoms ( shortness of breath, chest pain) .  Prescriptions: HYDROMET 5-1.5 MG/5ML SYRP (HYDROCODONE-HOMATROPINE) 1 tsp three times a day as needed  #120 cc x 0   Entered and Authorized by:   Evelena Peat MD   Signed by:   Evelena Peat MD on 11/11/2009   Method used:   Print then Give to Patient   RxID:   [id number]   Laboratory Results  Date/Time Received: November 11, 2009 5:00 PM  Date/Time Reported: November 11, 2009 5:00 PM   Other Tests  Rapid Strep: negative Comments Wynona Canes, CMA  November 11, 2009 5:01 PM

## 2010-07-08 NOTE — Progress Notes (Signed)
Summary: Radiology orders   Phone Note From Other Clinic   Caller: [facility]  Nedra Hai [phone] Call For: Dr. Leone Payor Summary of Call: [facility] is needing the orders for her appt. today Initial call taken by: Karna Christmas,  August 09, 2009 8:27 AM  Follow-up for Phone Call        orders sent Follow-up by: Darcey Nora RN, CGRN,  August 09, 2009 8:30 AM

## 2010-07-10 ENCOUNTER — Encounter: Payer: Self-pay | Admitting: Family Medicine

## 2010-07-10 ENCOUNTER — Ambulatory Visit (INDEPENDENT_AMBULATORY_CARE_PROVIDER_SITE_OTHER): Payer: BC Managed Care – PPO | Admitting: Family Medicine

## 2010-07-10 VITALS — BP 124/80 | HR 94 | Temp 98.7°F | Resp 20 | Ht 65.5 in | Wt 195.0 lb

## 2010-07-10 DIAGNOSIS — J069 Acute upper respiratory infection, unspecified: Secondary | ICD-10-CM

## 2010-07-10 MED ORDER — AZITHROMYCIN 250 MG PO TABS: ORAL_TABLET | ORAL | Status: AC

## 2010-07-10 MED ORDER — HYDROCODONE-HOMATROPINE 5-1.5 MG/5ML PO SYRP: 5.0000 mL | ORAL_SOLUTION | Freq: Four times a day (QID) | ORAL | Status: AC | PRN

## 2010-07-10 NOTE — Assessment & Plan Note (Signed)
Summary: UTI/dm   Vital Signs:  Patient profile:   25 year old female Weight:      196 pounds Temp:     98.4 degrees F oral BP sitting:   140 / 92  (left arm) Cuff size:   large  Vitals Entered By: Sid Falcon LPN (June 05, 2010 10:08 AM)  History of Present Illness: Here for 4 day hx of some urine freq and burning with urination. No relief with OTC Azostandard.  No fever, nausea, or vomiting No exacerbating features.  Allegy to green dye but no antibiotic allergy. No vaginal discharge.  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 Lungs:  Normal respiratory effort, chest expands symmetrically. Lungs are clear to auscultation, no crackles or wheezes. Heart:  Normal rate and regular rhythm. S1 and S2 normal without gallop, murmur, click, rub or other extra sounds. Msk:  no CVA tenderness.   Impression & Recommendations:  Problem # 1:  DYSURIA (ICD-788.1)  ?UTI.  Start Cipro and follow up next week if no better. Her updated medication list for this problem includes:    Ciprofloxacin Hcl 500 Mg Tabs (Ciprofloxacin hcl) ..... One by mouth two times a day for 3 days  Orders: UA Dipstick w/o Micro (manual) ([address])  Complete Medication List: 1)  Nuvaring 0.12-0.015 Mg/24hr Ring (Etonogestrel-ethinyl estradiol) .... Once monthly 2)  Hydrochlorothiazide 12.5 Mg Caps (Hydrochlorothiazide) .... Once daily 3)  Tylenol Extra Strength 500 Mg Tabs (Acetaminophen) .... Take one by mouth as needed 4)  Flovent Hfa 220 Mcg/act Aero (Fluticasone propionate  hfa) .... 2 puffs two times a day no spacer,  swallow puffs, sip  small amount  water  after  puffs, rinse mouthafter   no food/drink x 30 mins. 5)  Ciprofloxacin Hcl 500 Mg Tabs (Ciprofloxacin hcl) .... One by mouth two times a day for 3 days  Patient Instructions: 1)  Drink plenty of fluids up to 3-4 quarts a day. Cranberry juice is especially recommended in addition to large amounts of water. Avoid caffeine & carbonated drinks, they tend to irritate the bladder, Return in 3-5 days if you're not better: sooner if you're feeling worse.  Prescriptions: CIPROFLOXACIN HCL 500 MG TABS (CIPROFLOXACIN HCL) one by mouth two times a day for 3 days  #6 x 0   Entered and Authorized by:   Evelena Peat MD   Signed by:   Evelena Peat MD on 06/05/2010   Method used:   Electronically to        [facility] (retail)       [address]       [place], [place]  [address]       Ph: [phone]       Fax: [phone]   RxID:   [id number]    Orders Added: 1)  Est. Patient Level III [44010] 2)  UA Dipstick w/o Micro (manual) [81002]    Laboratory Results   Urine Tests    Routine Urinalysis   Color: yellow Appearance: Cloudy Glucose: negative   (Normal Range: Negative) Bilirubin: negative   (Normal Range: Negative) Ketone: negative   (Normal Range: Negative) Spec. Gravity: 1.025   (Normal Range: 1.003-1.035) Blood: negative   (  Normal Range: Negative) pH: 5.0   (Normal Range: 5.0-8.0) Protein: negative   (Normal Range: Negative) Urobilinogen: 0.2   (Normal Range: 0-1) Nitrite: negative   (Normal Range: Negative) Leukocyte Esterace: trace   (Normal Range: Negative)    Comments: Rita Ohara  June 05, 2010 9:54 AM

## 2010-07-10 NOTE — Progress Notes (Signed)
Summary: Pt has very bad bladder inf. Req work in [employer] or med called in  Phone Note Call from Patient Call back at Work Phone [phone]   Caller: Patient Summary of Call: Pt called and has a very bad bladder inf. Pain in lower abdomen. Also pain when urinating. Pt req work in ov for late this afternoon or early tomorrow a.m. Pt said that if no appt available, she would like a med called in to [facility]  in [place].  Initial call taken by: Lucy Antigua,  June 04, 2010 2:04 PM  Follow-up for Phone Call        work in early tomorrow Follow-up by: Evelena Peat MD,  June 04, 2010 2:29 PM  Additional Follow-up for Phone Call Additional follow up Details #1::        Notifified pt. Additional Follow-up by: Lynann Beaver CMA AAMA,  June 04, 2010 2:35 PM

## 2010-07-10 NOTE — Patient Instructions (Signed)
Acute Bronchitis You have acute bronchitis. This means you have a chest cold. The airways in your lungs are inflamed (red and sore). Acute means it is sudden onset. Bronchitis is most often caused by a virus. In smokers, people with chronic lung problems, and elderly patients, treatment with antibiotics for bacterial infection may be needed. Exposure to cigarette smoke or irritating chemicals will make bronchitis worse. Allergies and asthma can also make bronchitis worse. Repeated episodes of bronchitis may cause long standing lung problems. Acute bronchitis is usually treated with rest, fluids, and medicines for relief of fever or cough. Bronchodilator medicines from metered inhalers or a nebulizer may be used to help open up the small airways. This reduces shortness of breath and helps control cough. Antibiotics can be prescribed if you are more seriously ill or at risk. A cool air vaporizer may help thin bronchial secretions and make it easier to clear your chest. Increased fluids may also help. You must avoid smoking, even second hand exposure. If you are a cigarette smoker, consider using nicotine gum or skin patches to help control withdrawal symptoms. Recovery from bronchitis is often slow, but you should start feeling better after 2-3 days. Cough from bronchitis frequently lasts for 3-4 weeks.  SEEK IMMEDIATE MEDICAL CARE IF YOU DEVELOP:  Increased fever, chills, or chest pain.   Severe shortness of breath or bloody sputum.   Dehydration, fainting, repeated vomiting, severe headache.   No improvement after one week of proper treatment.  MAKE SURE YOU:   Understand these instructions.   Will watch your condition.   Will get help right away if you are not doing well or get worse.  Document Released: 07/02/2004 Document Re-Released: 05/07/2008 Exit[employer] Patient Information 2011 [employer]. 

## 2010-07-10 NOTE — Progress Notes (Signed)
  Subjective:    Patient ID: Tonya [name], female    DOB: 1985-11-28, 25 y.o.   MRN: [id number]  HPI Patient is seen with 3 to four-day history of illness with symptoms including sore throat, cough, malaise, and diffuse body aches. Intermittent earache. Denies any nausea, vomiting, or diarrhea. Denies some cough syrup without relief. Patient is nonsmoker.  No recent ill contacts.   Review of Systems    patient denies any skin rashes. No headaches. No abdominal pain Objective:   Physical Exam Patient is alert in no distress. Oropharynx no erythema or exudate TMs appear normal Neck is supple without adenopathy Chest clear to auscultation Heart regular rhythm and rate with no murmur Skin no rash       Assessment & Plan:  Acute viral upper rest for infection Hycodan cough syrup for suppression. No antibiotic regimen this time. Patient is very concerned that she tends to develop a slight pneumonia in the past. We'll write for antibiotics would use only if she has any worsening symptoms over the next several days.

## 2010-07-10 NOTE — Progress Notes (Signed)
Summary: Chg PCP request  Phone Note Call from Patient   Caller: Patient Call For: Birdie Sons MD Summary of Call: Pt here for OV and has seen Dr Caryl Never several times, easier to get appt due to Dr Cato Mulligan schedule. Requesting changing PCP to Dr Caryl Never. Initial call taken by: Sid Falcon LPN,  June 05, 2010 10:15 AM  Follow-up for Phone Call        ok  Additional Follow-up for Phone Call Additional follow up Details #1::        OK with me Additional Follow-up by: Evelena Peat MD,  June 06, 2010 5:15 PM    Additional Follow-up for Phone Call Additional follow up Details #2::    Pt informed Follow-up by: Sid Falcon LPN,  June 06, 2010 5:18 PM

## 2010-08-19 ENCOUNTER — Other Ambulatory Visit: Payer: Self-pay | Admitting: Internal Medicine

## 2010-08-20 ENCOUNTER — Other Ambulatory Visit: Payer: Self-pay | Admitting: *Deleted

## 2010-08-20 DIAGNOSIS — G43009 Migraine without aura, not intractable, without status migrainosus: Secondary | ICD-10-CM

## 2010-08-20 MED ORDER — ELETRIPTAN HYDROBROMIDE 40 MG PO TABS: 40.0000 mg | ORAL_TABLET | ORAL | Status: DC | PRN

## 2010-08-20 NOTE — Telephone Encounter (Signed)
Rx called in, pt informed.  Med was on her historical meds in EMR

## 2010-10-08 ENCOUNTER — Encounter: Payer: Self-pay | Admitting: Family Medicine

## 2010-10-08 ENCOUNTER — Ambulatory Visit (INDEPENDENT_AMBULATORY_CARE_PROVIDER_SITE_OTHER): Payer: BC Managed Care – PPO | Admitting: Family Medicine

## 2010-10-08 VITALS — BP 148/92 | Temp 98.0°F | Wt 192.0 lb

## 2010-10-08 DIAGNOSIS — H6590 Unspecified nonsuppurative otitis media, unspecified ear: Secondary | ICD-10-CM

## 2010-10-08 DIAGNOSIS — J209 Acute bronchitis, unspecified: Secondary | ICD-10-CM

## 2010-10-08 MED ORDER — HYDROCODONE-HOMATROPINE 5-1.5 MG/5ML PO SYRP: 5.0000 mL | ORAL_SOLUTION | Freq: Four times a day (QID) | ORAL | Status: AC | PRN

## 2010-10-08 NOTE — Patient Instructions (Signed)
Acute Bronchitis You have acute bronchitis. This means you have a chest cold. The airways in your lungs are inflamed (red and sore). Acute means it is sudden onset. Bronchitis is most often caused by a virus. In smokers, people with chronic lung problems, and elderly patients, treatment with antibiotics for bacterial infection may be needed. Exposure to cigarette smoke or irritating chemicals will make bronchitis worse. Allergies and asthma can also make bronchitis worse. Repeated episodes of bronchitis may cause long standing lung problems. Acute bronchitis is usually treated with rest, fluids, and medicines for relief of fever or cough. Bronchodilator medicines from metered inhalers or a nebulizer may be used to help open up the small airways. This reduces shortness of breath and helps control cough. Antibiotics can be prescribed if you are more seriously ill or at risk. A cool air vaporizer may help thin bronchial secretions and make it easier to clear your chest. Increased fluids may also help. You must avoid smoking, even second hand exposure. If you are a cigarette smoker, consider using nicotine gum or skin patches to help control withdrawal symptoms. Recovery from bronchitis is often slow, but you should start feeling better after 2-3 days. Cough from bronchitis frequently lasts for 3-4 weeks.  SEEK IMMEDIATE MEDICAL CARE IF YOU DEVELOP:  Increased fever, chills, or chest pain.   Severe shortness of breath or bloody sputum.   Dehydration, fainting, repeated vomiting, severe headache.   No improvement after one week of proper treatment.  MAKE SURE YOU:   Understand these instructions.   Will watch your condition.   Will get help right away if you are not doing well or get worse.  Document Released: 07/02/2004 Document Re-Released: 05/07/2008 Exit[employer] Patient Information 2011 [employer]. 

## 2010-10-08 NOTE — Progress Notes (Signed)
  Subjective:    Patient ID: Tonya [name], female    DOB: 04-20-1986, 25 y.o.   MRN: [id number]  HPI Patient seen with acute illness. Onset one week of sore throat. Followed by laryngitis Thursday. Over the weekend developed cough productive of green sputum. She is nonsmoker. No fever. Wedding coming up later this month. Bilateral ear pressure and fullness. No vertigo. No hearing loss. She is currently doing a exercise program and losing some weight and monitoring blood pressures closely at home   Review of Systems  Constitutional: Negative for fever and chills.  HENT: Positive for ear pain, congestion, postnasal drip and sinus pressure. Negative for hearing loss and sore throat.   Respiratory: Positive for cough. Negative for shortness of breath and wheezing.   Cardiovascular: Negative for chest pain, palpitations and leg swelling.       Objective:   Physical Exam  Constitutional: She appears well-developed and well-nourished. No distress.  HENT:  Mouth/Throat: Oropharynx is clear and moist. No oropharyngeal exudate.       She has some scarring of both eardrums. Left serous effusion  Neck: Neck supple.  Cardiovascular: Normal rate, regular rhythm and normal heart sounds.   No murmur heard. Pulmonary/Chest: Effort normal and breath sounds normal. No respiratory distress. She has no wheezes. She has no rales.  Lymphadenopathy:    She has no cervical adenopathy.  Skin: No rash noted.          Assessment & Plan:  #1 acute viral bronchitis #2 left serous effusion  Suspect viral process. Hycodan for cough suppression. Cont. weight loss efforts and monitoring of blood pressure

## 2010-10-24 NOTE — Op Note (Signed)
NAME:  Tonya [name], Tonya [name]                ACCOUNT NO.:  [id number]   MEDICAL RECORD NO.:  [id number]          PATIENT TYPE:  AMB   LOCATION:  DSC                          FACILITY:  [facility]   PHYSICIAN:  Currie Paris, M.D.DATE OF BIRTH:  Jun 14, 1985   DATE OF PROCEDURE:  01/19/2005  DATE OF DISCHARGE:                                 OPERATIVE REPORT   OFFICE MEDICAL RECORD NUMBER:  [id number]   PREOPERATIVE DIAGNOSIS:  Fibroadenoma, left breast, upper outer quadrant.   POSTOPERATIVE DIAGNOSIS:  Fibroadenoma, left breast, upper outer quadrant.   OPERATION:  Removal of left breast mass (fibroadenoma).   SURGEON:  Currie Paris, M.D.   ASSISTANT:  Chales Abrahams [place], [place] student   ANESTHESIA:  MAC.   CLINICAL HISTORY:  This is a 25 year old with a recently found left breast  mass which ultrasound indicated to be likely benign.  She preferred to have  an excisional biopsy rather than a needle or core biopsy or followup  ultrasounds.   DESCRIPTION OF PROCEDURE:  The patient was seen in the holding area and she  had no further questions.  She was taken to the operating room and the mass  itself was identified both by palpation and by ultrasound, and marked.  The  breast was then prepped and draped and a time-out occurred.   I injected 1% local in the skin overlying the mass and around it.  I made a  curvilinear incision just inferior to the mass to try to keep incision as  low as possible and raised a little bit of skin flap superiorly.  By  palpation, I was able to feel the mass readily and put a suture through it  for traction.  The cautery was then used to excise this in toto.  This  specimen was sent to Pathology.  The wound was checked for hemostasis and  once everything was dry, it was closed in layers with 3-0 Vicryl and 4-0  Monocryl subcuticular plus Dermabond.   The patient tolerated procedure well.  There were no operative  complications.  All counts were  correct.      Currie Paris, M.D.  Electronically Signed    CJS/MEDQ  D:  01/19/2005  T:  01/20/2005  Job:  [id number]   cc:   Jeannett Senior A. Clent Ridges, M.D. [facility]

## 2010-11-06 ENCOUNTER — Telehealth: Payer: Self-pay | Admitting: Internal Medicine

## 2010-11-06 MED ORDER — SCOPOLAMINE 1 MG/3DAYS TD PT72: 1.0000 | MEDICATED_PATCH | TRANSDERMAL | Status: DC

## 2010-11-06 NOTE — Telephone Encounter (Signed)
sopolamine patch apply q 3days. #4/0 refills

## 2010-11-06 NOTE — Telephone Encounter (Signed)
Pt called and said that she is leaving to go on cruise Sunday. Pt req that Dr Caryl Never call in a patch for motion sickness as previously discussed during ov. Pls call in to [facility] [phone] in [place]

## 2010-11-06 NOTE — Telephone Encounter (Signed)
Please advise 

## 2011-02-12 ENCOUNTER — Telehealth: Payer: Self-pay | Admitting: Internal Medicine

## 2011-02-12 MED ORDER — AZITHROMYCIN 250 MG PO TABS: ORAL_TABLET | ORAL | Status: AC

## 2011-02-12 NOTE — Telephone Encounter (Signed)
rx called in and patient is aware 

## 2011-02-12 NOTE — Telephone Encounter (Signed)
OK to call in for 5 day Z-pack.

## 2011-02-12 NOTE — Telephone Encounter (Signed)
Pt called and said that she is sch to have back surgery on 9/28, but has pre-op on 9/20. Pt has a sinus infection and is req Dr Caryl Never to call in a zpak to [facility] in [place]. Pt says that she used to be a pt of Dr Cato Mulligan, but she has changed to Dr Caryl Never and that both doctors approved to the change.

## 2011-02-24 ENCOUNTER — Ambulatory Visit (INDEPENDENT_AMBULATORY_CARE_PROVIDER_SITE_OTHER): Payer: BC Managed Care – PPO | Admitting: Family Medicine

## 2011-02-24 ENCOUNTER — Encounter: Payer: Self-pay | Admitting: Family Medicine

## 2011-02-24 VITALS — BP 140/90 | Temp 98.7°F | Wt 206.0 lb

## 2011-02-24 DIAGNOSIS — J019 Acute sinusitis, unspecified: Secondary | ICD-10-CM

## 2011-02-26 ENCOUNTER — Encounter (HOSPITAL_COMMUNITY)
Admission: RE | Admit: 2011-02-26 | Discharge: 2011-02-26 | Disposition: A | Discharge status: Home / Self Care | Payer: BC Managed Care – PPO | Source: Ambulatory Visit | Attending: Orthopaedic Surgery | Admitting: Orthopaedic Surgery

## 2011-02-26 LAB — BASIC METABOLIC PANEL
GFR calc Af Amer: >60 mL/min (ref >60)
GFR calc non Af Amer: >60 mL/min (ref >60)
Glucose, Bld: 86 mg/dL (ref 70–99)
Potassium: 4.5 mEq/L (ref 3.5–5.1)
Sodium: 139 mEq/L (ref 135–145)

## 2011-02-26 LAB — CBC
MCV: 86.5 fL (ref 78.0–100.0)
Platelets: 343 10*3/uL (ref 150–400)
RBC: 5.13 MIL/uL — ABNORMAL HIGH (ref 3.87–5.11)
WBC: 13 10*3/uL — ABNORMAL HIGH (ref 4.0–10.5)

## 2011-02-26 LAB — TYPE AND SCREEN
ABO/RH(D): O POS
Antibody Screen: NEGATIVE

## 2011-02-26 LAB — PROTIME-INR: INR: 0.97 (ref 0.00–1.49)

## 2011-02-26 LAB — HCG, SERUM, QUALITATIVE: Preg, Serum: NEGATIVE

## 2011-02-26 LAB — SURGICAL PCR SCREEN: Staphylococcus aureus: NEGATIVE

## 2011-03-06 ENCOUNTER — Ambulatory Visit (HOSPITAL_COMMUNITY)
Admission: RE | Admit: 2011-03-06 | Discharge: 2011-03-07 | Disposition: A | Discharge status: Home / Self Care | Payer: BC Managed Care – PPO | Source: Ambulatory Visit | Attending: Orthopaedic Surgery | Admitting: Orthopaedic Surgery

## 2011-03-06 ENCOUNTER — Ambulatory Visit (HOSPITAL_COMMUNITY): Payer: BC Managed Care – PPO

## 2011-03-06 DIAGNOSIS — Z01812 Encounter for preprocedural laboratory examination: Secondary | ICD-10-CM | POA: Insufficient documentation

## 2011-03-06 DIAGNOSIS — M5126 Other intervertebral disc displacement, lumbar region: Secondary | ICD-10-CM | POA: Insufficient documentation

## 2011-03-06 HISTORY — PX: SPINE SURGERY: SHX786

## 2011-03-10 NOTE — Op Note (Signed)
  Tonya [name], Tonya [name] NO.:  [id number]  MEDICAL RECORD NO.:  [id number]  LOCATION:  5011                         FACILITY:  [facility]  PHYSICIAN:  Chiffon Kittleson C. Ophelia Charter, M.D.    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  PROCEDURE:  Left L5-S1 microdiskectomy.  SURGEON:  Oliwia Berzins C. Ophelia Charter, MD  ASSISTANT:  Maud Deed, PA-C, medically necessary and present for the entire procedure.  ESTIMATED BLOOD LOSS:  Less than 100 mL.  COMPLICATIONS:  None.  ANESTHESIA:  GOT plus Marcaine skin local.  INDICATIONS:  This is a 25 year old female with persistent problems with low back pain, radicular pain, failed conservative treatment, physical therapy, anti-inflammatories, narcotic pain medication therapy, and epidurals.  DESCRIPTION OF PROCEDURE:  After induction of general anesthesia, orotracheal intubation, time-out procedure, the patient was placed in the prone position, careful padding positioning shoulder pads, pads of the ulnar nerve, 10/10 drape.  Back was prepped with DuraPrep.  Area was squared with towels.  Betadine, Steri drape application after sterile skin marker and laminectomy sheet.  Spinal needle localization showed the needle was just about the L5-S1 level.  An 1.5 inch incision was made, subperiosteal dissection down to the level of the lamina and then a #4 Penfield placed at the interlaminar space, confirmed with second cross-table lateral x-ray.  Ligament was removed.  Laminotomy was performed.  Some overhanging facet was trimmed back.  Disk was bulging and then centrally was firm.  Operative microscope was draped and small veins were coagulated with bipolar cautery.  Annulus was  then sized to 15.  Scalpel blade passes with straight up micro pituitaries, straight regular pituitary, and regular upbiting pituitary was performed.  Pieces of disks were teased with Epstein curettes and removed until the disk protrusion was gone.  Ball-tip nerve hook was used to peel some of the fragments which were extruded underneath the posterior longitudinal ligament on the back of the S1 body, end of the disk space and then removed.  Nerve root was free.  Axilla of the nerve root was clear. Hockey stick was swept anterior to the dura with 180-degree sweep.  No areas of compression.  Central disk protrusion was resolved.  Final passes were made.  Irrigation with saline solution and standard layered closure with #1 Vicryl in deep fascia, 2-0 Vicryl subcutaneous tissue, and 4-0 Vicryl subcuticular closure.  Tincture of benzoin, Steri-Strips, postop dressing, and then transferred to the recovery room in stable condition.  Dura was intact.  She was neurologically intact.  Needle count and instrument counts were correct.     [facility] C. Ophelia Charter, M.D.     MCY/MEDQ  D:  03/06/2011  T:  03/06/2011  Job:  [id number]  Electronically Signed by Annell Greening M.D. on 03/10/2011 05:24:25 PM

## 2011-04-10 NOTE — Progress Notes (Signed)
System Downtime Recovery The EMR experienced a system downtime.  This downtime occurred on 02-24-2011. During this downtime paper charting was completed by the provider.  The visit was documented on paper during the downtime and will be scanned into [facility], billing was completed by the [facility] Billing Department .  The visit is being closed on behalf of the provider. 

## 2011-10-22 LAB — OB RESULTS CONSOLE GC/CHLAMYDIA: Gonorrhea: NEGATIVE

## 2011-12-04 LAB — OB RESULTS CONSOLE RPR: RPR: NONREACTIVE

## 2011-12-04 LAB — OB RESULTS CONSOLE HIV ANTIBODY (ROUTINE TESTING): HIV: NONREACTIVE

## 2011-12-04 LAB — OB RESULTS CONSOLE RUBELLA ANTIBODY, IGM: Rubella: IMMUNE

## 2012-01-06 ENCOUNTER — Other Ambulatory Visit (INDEPENDENT_AMBULATORY_CARE_PROVIDER_SITE_OTHER): Payer: BC Managed Care – PPO

## 2012-01-06 DIAGNOSIS — Z0000 Encounter for general adult medical examination without abnormal findings: Secondary | ICD-10-CM

## 2012-01-06 LAB — POCT URINALYSIS DIPSTICK
Bilirubin, UA: NEGATIVE
Blood, UA: NEGATIVE
Glucose, UA: NEGATIVE
Ketones, UA: NEGATIVE
Leukocytes, UA: NEGATIVE
Nitrite, UA: NEGATIVE
Protein, UA: NEGATIVE
Spec Grav, UA: >=1.03
Urobilinogen, UA: 1
pH, UA: 7

## 2012-01-06 LAB — HEPATIC FUNCTION PANEL
Bilirubin, Direct: 0 mg/dL (ref 0.0–0.3)
Total Bilirubin: 0.4 mg/dL (ref 0.3–1.2)

## 2012-01-06 LAB — CBC WITH DIFFERENTIAL/PLATELET
Eosinophils Absolute: 0.2 10*3/uL (ref 0.0–0.7)
HCT: 39.7 % (ref 36.0–46.0)
Lymphs Abs: 2.1 10*3/uL (ref 0.7–4.0)
MCHC: 33.1 g/dL (ref 30.0–36.0)
MCV: 91.4 fl (ref 78.0–100.0)
Monocytes Absolute: 0.5 10*3/uL (ref 0.1–1.0)
Neutrophils Relative %: 72.2 % (ref 43.0–77.0)
Platelets: 278 10*3/uL (ref 150.0–400.0)
RDW: 13.2 % (ref 11.5–14.6)

## 2012-01-06 LAB — BASIC METABOLIC PANEL
BUN: 7 mg/dL (ref 6–23)
CO2: 26 mEq/L (ref 19–32)
Chloride: 103 mEq/L (ref 96–112)
Creatinine, Ser: 0.4 mg/dL (ref 0.4–1.2)
Glucose, Bld: 86 mg/dL (ref 70–99)

## 2012-01-06 LAB — LIPID PANEL
Cholesterol: 219 mg/dL — ABNORMAL HIGH (ref 0–200)
Total CHOL/HDL Ratio: 4
Triglycerides: 153 mg/dL — ABNORMAL HIGH (ref 0.0–149.0)
VLDL: 30.6 mg/dL (ref 0.0–40.0)

## 2012-01-06 LAB — TSH: TSH: 1.01 u[IU]/mL (ref 0.35–5.50)

## 2012-01-13 ENCOUNTER — Encounter: Payer: Self-pay | Admitting: Family Medicine

## 2012-01-13 ENCOUNTER — Ambulatory Visit (INDEPENDENT_AMBULATORY_CARE_PROVIDER_SITE_OTHER): Payer: BC Managed Care – PPO | Admitting: Family Medicine

## 2012-01-13 VITALS — BP 128/84 | HR 80 | Temp 98.6°F | Resp 12 | Ht 65.5 in | Wt 213.0 lb

## 2012-01-13 DIAGNOSIS — Z0000 Encounter for general adult medical examination without abnormal findings: Secondary | ICD-10-CM

## 2012-01-13 NOTE — Patient Instructions (Addendum)
Recommend Tdap and flu vaccine this fall.

## 2012-01-13 NOTE — Progress Notes (Signed)
  Subjective:    Patient ID: Tonya [name], female    DOB: Feb 27, 1986, 26 y.o.   MRN: [id number]  HPI  Patient here for complete physical. [redacted] weeks pregnant. Has been followed by obstetrician. Recent Pap smear normal. Last tetanus 2003. Takes no medications. History of borderline elevated blood pressure previously though currently stable off medication. Prior history of migraine headaches but none recently. She's had some nausea with pregnancy and this is usually confined early-morning.  No past medical history on file. Past Surgical History  Procedure Date  . Spine surgery 03/06/11    lumbar disc L5  . Shoulder surgery 2008, 2009    L shoulder, acromion shave and recurrent dislocation.    reports that she has never smoked. She does not have any smokeless tobacco history on file. Her alcohol and drug histories not on file. family history includes Diabetes in her maternal grandfather. Allergies  Allergen Reactions  . Green Dyes     REACTION: hives      Review of Systems  Constitutional: Negative for fever, activity change, appetite change, fatigue and unexpected weight change.  HENT: Negative for hearing loss, ear pain, sore throat and trouble swallowing.   Eyes: Negative for visual disturbance.  Respiratory: Negative for cough and shortness of breath.   Cardiovascular: Negative for chest pain and palpitations.  Gastrointestinal: Positive for nausea. Negative for abdominal pain, diarrhea, constipation and blood in stool.  Genitourinary: Negative for dysuria and hematuria.  Musculoskeletal: Negative for myalgias, back pain and arthralgias.  Skin: Negative for rash.  Neurological: Negative for dizziness, syncope and headaches.  Hematological: Negative for adenopathy.  Psychiatric/Behavioral: Negative for confusion and dysphoric mood.       Objective:   Physical Exam  Constitutional: She is oriented to person, place, and time. She appears well-developed and well-nourished.    HENT:  Head: Normocephalic and atraumatic.  Eyes: EOM are normal. Pupils are equal, round, and reactive to light.  Neck: Normal range of motion. Neck supple. No thyromegaly present.  Cardiovascular: Normal rate, regular rhythm and normal heart sounds.   No murmur heard. Pulmonary/Chest: Breath sounds normal. No respiratory distress. She has no wheezes. She has no rales.  Abdominal: Soft. Bowel sounds are normal. She exhibits no distension and no mass. There is no tenderness. There is no rebound and no guarding.  Genitourinary:       As per GYN  Musculoskeletal: Normal range of motion. She exhibits no edema.  Lymphadenopathy:    She has no cervical adenopathy.  Neurological: She is alert and oriented to person, place, and time. She displays normal reflexes. No cranial nerve deficit.  Skin: No rash noted.  Psychiatric: She has a normal mood and affect. Her behavior is normal. Judgment and thought content normal.          Assessment & Plan:  26 year old female who is [redacted] weeks pregnant. Labs reviewed with patient. Mild dyslipidemia otherwise labs normal. Strongly advised to get tetanus booster with acellular pertussis and flu vaccine she'll get these through GYN office. She is taking prenatal vitamin with folic acid supplement

## 2012-02-15 ENCOUNTER — Encounter: Payer: Self-pay | Admitting: Family

## 2012-02-15 ENCOUNTER — Ambulatory Visit (INDEPENDENT_AMBULATORY_CARE_PROVIDER_SITE_OTHER): Payer: BC Managed Care – PPO | Admitting: Family

## 2012-02-15 VITALS — BP 122/80 | HR 118 | Temp 97.2°F | Wt 208.0 lb

## 2012-02-15 DIAGNOSIS — Z3490 Encounter for supervision of normal pregnancy, unspecified, unspecified trimester: Secondary | ICD-10-CM

## 2012-02-15 DIAGNOSIS — Z331 Pregnant state, incidental: Secondary | ICD-10-CM

## 2012-02-15 DIAGNOSIS — J069 Acute upper respiratory infection, unspecified: Secondary | ICD-10-CM

## 2012-02-15 DIAGNOSIS — R0982 Postnasal drip: Secondary | ICD-10-CM

## 2012-02-15 MED ORDER — FLUTICASONE PROPIONATE 50 MCG/ACT NA SUSP: 2.0000 | Freq: Every day | NASAL | Status: DC

## 2012-02-15 NOTE — Progress Notes (Signed)
Subjective:    Patient ID: Tonya [name], female    DOB: Jul 20, 1985, 26 y.o.   MRN: [id number]  Sinusitis This is a new (nonsmoker, [redacted] week pregnant) problem. The current episode started in the past 7 days. The problem has been gradually worsening since onset. There has been no fever. Her pain is at a severity of 8/10. Associated symptoms include congestion, coughing, ear pain and sinus pressure. Pertinent negatives include no headaches, shortness of breath or sore throat. Past treatments include oral decongestants. The treatment provided no relief.  Otalgia  Associated symptoms include coughing. Pertinent negatives include no ear discharge, headaches, hearing loss or sore throat.      Review of Systems  Constitutional: Negative for fever.  HENT: Positive for ear pain, congestion and sinus pressure. Negative for hearing loss, sore throat and ear discharge.   Respiratory: Positive for cough. Negative for shortness of breath.   Neurological: Negative for headaches.   No past medical history on file.  History   Social History  . Marital Status: Married    Spouse Name: N/A    Number of Children: N/A  . Years of Education: N/A   Occupational History  . Not on file.   Social History Main Topics  . Smoking status: Never Smoker   . Smokeless tobacco: Not on file  . Alcohol Use: Not on file  . Drug Use: Not on file  . Sexually Active: Not on file   Other Topics Concern  . Not on file   Social History Narrative  . No narrative on file    Past Surgical History  Procedure Date  . Spine surgery 03/06/11    lumbar disc L5  . Shoulder surgery 2008, 2009    L shoulder, acromion shave and recurrent dislocation.    Family History  Problem Relation Age of Onset  . Diabetes Maternal Grandfather     Allergies  Allergen Reactions  . Green Dyes     REACTION: hives    Current Outpatient Prescriptions on File Prior to Visit  Medication Sig Dispense Refill  . Prenatal Vit-Fe  Fumarate-FA (MULTIVITAMIN-PRENATAL) 27-0.8 MG TABS Take 1 tablet by mouth daily. Per Dr Renaldo Fiddler      . doxylamine, Sleep, (UNISOM) 25 MG tablet Take 25 mg by mouth at bedtime as needed. Per Dr Renaldo Fiddler (OB/GYN)      . eletriptan (RELPAX) 40 MG tablet One tablet by mouth as needed for migraine headache.  If the headache improves and then returns, dose may be repeated after 2 hours have elapsed since first dose (do not exceed 80 mg per day). may repeat in 2 hours if necessary  10 tablet  0  . etonogestrel-ethinyl estradiol (NUVARING) 0.12-0.015 MG/24HR vaginal ring Place 1 each vaginally every 28 days. Insert vaginally and leave in place for 3 consecutive weeks, then remove for 1 week.       . fluticasone (FLONASE) 50 MCG/ACT nasal spray Place 2 sprays into the nose daily.  1 g  1  . l-methylfolate-b2-b6-b12 (CEREFOLIN) 11-06-48-5 MG TABS Take 1 tablet by mouth daily. Per Dr Renaldo Fiddler        BP 122/80  Pulse 118  Temp 97.2 F (36.2 C) (Oral)  Wt 208 lb (94.348 kg)  SpO2 98%chart    Objective:   Physical Exam  Constitutional: She appears well-developed and well-nourished.  HENT:  Head: Normocephalic.  Right Ear: External ear normal.  Left Ear: External ear normal.  Nose: Nose normal.  Eyes: Pupils are equal,  round, and reactive to light.  Neck: Neck supple.  Cardiovascular: Normal rate and regular rhythm.   Pulmonary/Chest: Effort normal and breath sounds normal.  Skin: Skin is warm and dry.  Psychiatric: She has a normal mood and affect. Thought content normal.          Assessment & Plan:  Assessment; Upper respiratory infection & Otalgia  Plan: OTC Zyrtec daily , Flonase 2 sprays in each nostril daily as needed. Call the office is symptoms become worst or persistent for follow up.

## 2012-02-15 NOTE — Patient Instructions (Addendum)

## 2012-04-27 ENCOUNTER — Encounter: Payer: BC Managed Care – PPO | Attending: Obstetrics and Gynecology | Admitting: Dietician

## 2012-04-27 VITALS — Ht 66.0 in | Wt 212.8 lb

## 2012-04-27 DIAGNOSIS — Z713 Dietary counseling and surveillance: Secondary | ICD-10-CM | POA: Insufficient documentation

## 2012-04-27 DIAGNOSIS — O99810 Abnormal glucose complicating pregnancy: Secondary | ICD-10-CM | POA: Insufficient documentation

## 2012-04-27 DIAGNOSIS — O24419 Gestational diabetes mellitus in pregnancy, unspecified control: Secondary | ICD-10-CM

## 2012-05-08 ENCOUNTER — Encounter: Payer: Self-pay | Admitting: Dietician

## 2012-05-08 NOTE — Progress Notes (Signed)
Patient was seen on 04/27/12 for Gestational Diabetes self-management class at the [facility]. The following learning objectives were met by the patient during this course:   States the definition of Gestational Diabetes  States why dietary management is important in controlling blood glucose  Describes the effects each nutrient has on blood glucose levels  Demonstrates ability to create a balanced meal plan  Demonstrates carbohydrate counting   States when to check blood glucose levels  Demonstrates proper blood glucose monitoring techniques  States the effect of stress and exercise on blood glucose levels  States the importance of limiting caffeine and abstaining from alcohol and smoking  Blood glucose monitor given:  Has purchased a Reli-On meter and is currently monitoring.  Review of the monitor procedure,  Patient instructed to monitor glucose levels: FBS: 60 - <90 2 hour: <120  Patient received handouts:  Nutrition Diabetes and Pregnancy  Carbohydrate Counting List  Patient will be seen for follow-up as needed.

## 2012-06-20 LAB — OB RESULTS CONSOLE GBS: GBS: NEGATIVE

## 2012-06-29 ENCOUNTER — Inpatient Hospital Stay (HOSPITAL_COMMUNITY)
Admission: AD | Admit: 2012-06-29 | Discharge: 2012-06-30 | Disposition: A | Discharge status: Home / Self Care | Payer: BC Managed Care – PPO | Source: Ambulatory Visit | Attending: Obstetrics and Gynecology | Admitting: Obstetrics and Gynecology

## 2012-06-29 ENCOUNTER — Encounter (HOSPITAL_COMMUNITY): Payer: Self-pay | Admitting: *Deleted

## 2012-06-29 DIAGNOSIS — O479 False labor, unspecified: Secondary | ICD-10-CM | POA: Insufficient documentation

## 2012-06-29 HISTORY — DX: Gestational diabetes mellitus in pregnancy, unspecified control: O24.419

## 2012-06-29 NOTE — MAU Note (Signed)
Pt reports contractions q 3-5 minutes.

## 2012-06-30 MED ORDER — ZOLPIDEM TARTRATE 5 MG PO TABS
5.0000 mg | ORAL_TABLET | Freq: Once | ORAL | Status: AC
  Administered 2012-06-30: 5 mg via ORAL
  Filled 2012-06-30: qty 1

## 2012-07-04 ENCOUNTER — Encounter (HOSPITAL_COMMUNITY): Payer: Self-pay | Admitting: *Deleted

## 2012-07-04 ENCOUNTER — Inpatient Hospital Stay (HOSPITAL_COMMUNITY)
Admission: AD | Admit: 2012-07-04 | Discharge: 2012-07-04 | Disposition: A | Discharge status: Home / Self Care | Payer: BC Managed Care – PPO | Source: Ambulatory Visit | Attending: Obstetrics and Gynecology | Admitting: Obstetrics and Gynecology

## 2012-07-04 DIAGNOSIS — O139 Gestational [pregnancy-induced] hypertension without significant proteinuria, unspecified trimester: Secondary | ICD-10-CM | POA: Insufficient documentation

## 2012-07-04 LAB — CBC
MCH: 30.7 pg (ref 26.0–34.0)
MCHC: 33.5 g/dL (ref 30.0–36.0)
MCV: 91.7 fL (ref 78.0–100.0)
Platelets: 244 10*3/uL (ref 150–400)
RDW: 13.8 % (ref 11.5–15.5)
WBC: 11.4 10*3/uL — ABNORMAL HIGH (ref 4.0–10.5)

## 2012-07-04 LAB — COMPREHENSIVE METABOLIC PANEL
AST: 17 U/L (ref 0–37)
Albumin: 2.8 g/dL — ABNORMAL LOW (ref 3.5–5.2)
Calcium: 8.8 mg/dL (ref 8.4–10.5)
Chloride: 100 mEq/L (ref 96–112)
Creatinine, Ser: 0.55 mg/dL (ref 0.50–1.10)
Total Protein: 6.1 g/dL (ref 6.0–8.3)

## 2012-07-04 LAB — URIC ACID: Uric Acid, Serum: 4.6 mg/dL (ref 2.4–7.0)

## 2012-07-04 LAB — LACTATE DEHYDROGENASE: LDH: 199 U/L (ref 94–250)

## 2012-07-04 NOTE — MAU Note (Signed)
Patient is sent from the office due to elevated BP. She is 4cm and a gestational diabetic on glyburide. Her cbg was 85 at 10am after eating breakfast. She reports feeling intermittent contractions and good fetal movement. She denies headache, visual disturbances, lof.

## 2012-07-04 NOTE — MAU Provider Note (Signed)
Chief Complaint:  Hypertension   First Provider Initiated Contact with Patient 07/04/12 1249      HPI: Tonya [name] is a 27 y.o. G1P0000 at [redacted]w[redacted]d who presents to maternity admissions sent from office by Dr Arelia Sneddon for elevated BP.  She reports feeling cramping/contractions 5-6/hour that are not painful, reports good fetal movement, and denies h/a, epigastric pain, visual disturbances, LOF, vaginal bleeding, vaginal itching/burning, urinary symptoms, n/v, or fever/chills.    Past Medical History: Past Medical History  Diagnosis Date  . Hypertension     no meds  . Gestational diabetes     oral meds     Past Surgical History: Past Surgical History  Procedure Date  . Spine surgery 03/06/11    lumbar disc L5  . Shoulder surgery 2008, 2009    L shoulder, acromion shave and recurrent dislocation.  . Breast surgery     removed a lump    Family History: Family History  Problem Relation Age of Onset  . Diabetes Paternal Uncle   . Diabetes Paternal Grandmother   . Asthma Other   . Cancer Other   . Hypertension Other   . Hyperlipidemia Other   . Stroke Other   . Obesity Other   . Sleep apnea Other     Social History: History  Substance Use Topics  . Smoking status: Never Smoker   . Smokeless tobacco: Not on file  . Alcohol Use: Not on file    Allergies:  Allergies  Allergen Reactions  . Green Dyes     REACTION: hives    Meds:  Prescriptions prior to admission  Medication Sig Dispense Refill  . acetaminophen (TYLENOL) 325 MG tablet Take 325 mg by mouth every 6 (six) hours as needed.      . doxylamine, Sleep, (UNISOM) 25 MG tablet Take 25 mg by mouth at bedtime as needed. Per Dr Renaldo Fiddler (OB/GYN)      . eletriptan (RELPAX) 40 MG tablet One tablet by mouth as needed for migraine headache.  If the headache improves and then returns, dose may be repeated after 2 hours have elapsed since first dose (do not exceed 80 mg per day). may repeat in 2 hours if necessary  10  tablet  0  . etonogestrel-ethinyl estradiol (NUVARING) 0.12-0.015 MG/24HR vaginal ring Place 1 each vaginally every 28 days. Insert vaginally and leave in place for 3 consecutive weeks, then remove for 1 week.       . fluticasone (FLONASE) 50 MCG/ACT nasal spray Place 2 sprays into the nose daily.  1 g  1  . glyBURIDE (DIABETA) 2.5 MG tablet Take 2.5 mg by mouth at bedtime.      Marland Kitchen l-methylfolate-b2-b6-b12 (CEREFOLIN) 11-06-48-5 MG TABS Take 1 tablet by mouth daily. Per Dr Renaldo Fiddler      . Prenatal Vit-Fe Fumarate-FA (MULTIVITAMIN-PRENATAL) 27-0.8 MG TABS Take 1 tablet by mouth daily. Per Dr Renaldo Fiddler      . ranitidine (ZANTAC) 150 MG tablet Take 150 mg by mouth 2 (two) times daily.        ROS: Pertinent findings in history of present illness.  Physical Exam  Blood pressure 142/79, pulse 93, temperature 97.5 F (36.4 C), temperature source Oral, resp. rate 18, height 5' 5.75" (1.67 m), weight 91.23 kg (201 lb 2 oz), last menstrual period 01/26/2011. GENERAL: Well-developed, well-nourished female in no acute distress.  HEENT: normocephalic HEART: normal rate RESP: normal effort ABDOMEN: Soft, non-tender, gravid appropriate for gestational age EXTREMITIES: Nontender, no edema NEURO: alert and  oriented SPECULUM EXAM: Deferred, 4 cm in office today    FHT:  Baseline 130 , moderate variability, accelerations present, no decelerations Contractions: q 4-8 mins, irregular, mild to palpation, lasting 70 seconds   Labs: . Results for orders placed during the hospital encounter of 07/04/12 (from the past 24 hour(s))  CBC     Status: Abnormal   Collection Time   07/04/12 12:30 PM      Component Value Range   WBC 11.4 (*) 4.0 - 10.5 K/uL   RBC 4.33  3.87 - 5.11 MIL/uL   Hemoglobin 13.3  12.0 - 15.0 g/dL   HCT 29.5  62.1 - 30.8 %   MCV 91.7  78.0 - 100.0 fL   MCH 30.7  26.0 - 34.0 pg   MCHC 33.5  30.0 - 36.0 g/dL   RDW 65.7  84.6 - 96.2 %   Platelets 244  150 - 400 K/uL  COMPREHENSIVE METABOLIC  PANEL     Status: Abnormal   Collection Time   07/04/12 12:30 PM      Component Value Range   Sodium 133 (*) 135 - 145 mEq/L   Potassium 3.9  3.5 - 5.1 mEq/L   Chloride 100  96 - 112 mEq/L   CO2 19  19 - 32 mEq/L   Glucose, Bld 75  70 - 99 mg/dL   BUN 6  6 - 23 mg/dL   Creatinine, Ser 9.52  0.50 - 1.10 mg/dL   Calcium 8.8  8.4 - 84.1 mg/dL   Total Protein 6.1  6.0 - 8.3 g/dL   Albumin 2.8 (*) 3.5 - 5.2 g/dL   AST 17  0 - 37 U/L   ALT 12  0 - 35 U/L   Alkaline Phosphatase 139 (*) 39 - 117 U/L   Total Bilirubin 0.4  0.3 - 1.2 mg/dL   GFR calc non Af Amer >90  >90 mL/min   GFR calc Af Amer >90  >90 mL/min  URIC ACID     Status: Normal   Collection Time   07/04/12 12:30 PM      Component Value Range   Uric Acid, Serum 4.6  2.4 - 7.0 mg/dL  LACTATE DEHYDROGENASE     Status: Normal   Collection Time   07/04/12 12:30 PM      Component Value Range   LDH 199  94 - 250 U/L   Assessment: 1. Gestational hypertension     Plan: Called Dr Rana Snare to discuss assessment and findings Discharge home with preeclampsia and labor precautions Pt to f/u in office tomorrow Return to MAU as needed    Medication List     As of 07/04/2012 12:53 PM    ASK your doctor about these medications         acetaminophen 325 MG tablet   Commonly known as: TYLENOL   Take 325 mg by mouth every 6 (six) hours as needed.      doxylamine (Sleep) 25 MG tablet   Commonly known as: UNISOM   Take 25 mg by mouth at bedtime as needed. Per Dr Renaldo Fiddler (OB/GYN)      eletriptan 40 MG tablet   Commonly known as: RELPAX   One tablet by mouth as needed for migraine headache.  If the headache improves and then returns, dose may be repeated after 2 hours have elapsed since first dose (do not exceed 80 mg per day). may repeat in 2 hours if necessary      etonogestrel-ethinyl estradiol  0.12-0.015 MG/24HR vaginal ring   Commonly known as: NUVARING   Place 1 each vaginally every 28 days. Insert vaginally and leave in place  for 3 consecutive weeks, then remove for 1 week.      fluticasone 50 MCG/ACT nasal spray   Commonly known as: FLONASE   Place 2 sprays into the nose daily.      glyBURIDE 2.5 MG tablet   Commonly known as: DIABETA   Take 2.5 mg by mouth at bedtime.      l-methylfolate-b2-b6-b12 11-06-48-5 MG Tabs   Commonly known as: CEREFOLIN   Take 1 tablet by mouth daily. Per Dr Renaldo Fiddler      multivitamin-prenatal 27-0.8 MG Tabs   Take 1 tablet by mouth daily. Per Dr Renaldo Fiddler      ranitidine 150 MG tablet   Commonly known as: ZANTAC   Take 150 mg by mouth 2 (two) times daily.        Sharen Counter Certified Nurse-Midwife 07/04/2012 12:53 PM

## 2012-07-05 ENCOUNTER — Encounter (HOSPITAL_COMMUNITY): Payer: Self-pay | Admitting: *Deleted

## 2012-07-05 ENCOUNTER — Telehealth (HOSPITAL_COMMUNITY): Payer: Self-pay | Admitting: *Deleted

## 2012-07-05 NOTE — Telephone Encounter (Signed)
Preadmission screen  

## 2012-07-07 ENCOUNTER — Telehealth (HOSPITAL_COMMUNITY): Payer: Self-pay | Admitting: *Deleted

## 2012-07-07 NOTE — Telephone Encounter (Signed)
Preadmission screen  

## 2012-07-09 ENCOUNTER — Encounter (HOSPITAL_COMMUNITY): Payer: Self-pay

## 2012-07-09 ENCOUNTER — Inpatient Hospital Stay (HOSPITAL_COMMUNITY)
Admission: RE | Admit: 2012-07-09 | Discharge: 2012-07-11 | DRG: 372 | Disposition: A | Discharge status: Home / Self Care | Payer: BC Managed Care – PPO | Source: Ambulatory Visit | Attending: Obstetrics & Gynecology | Admitting: Obstetrics & Gynecology

## 2012-07-09 ENCOUNTER — Inpatient Hospital Stay (HOSPITAL_COMMUNITY): Payer: BC Managed Care – PPO | Admitting: Anesthesiology

## 2012-07-09 ENCOUNTER — Encounter (HOSPITAL_COMMUNITY): Payer: Self-pay | Admitting: Anesthesiology

## 2012-07-09 DIAGNOSIS — O1002 Pre-existing essential hypertension complicating childbirth: Secondary | ICD-10-CM | POA: Diagnosis present

## 2012-07-09 DIAGNOSIS — I10 Essential (primary) hypertension: Secondary | ICD-10-CM

## 2012-07-09 DIAGNOSIS — G43009 Migraine without aura, not intractable, without status migrainosus: Secondary | ICD-10-CM

## 2012-07-09 DIAGNOSIS — J069 Acute upper respiratory infection, unspecified: Secondary | ICD-10-CM

## 2012-07-09 DIAGNOSIS — O99814 Abnormal glucose complicating childbirth: Principal | ICD-10-CM | POA: Diagnosis present

## 2012-07-09 LAB — CBC
Platelets: 223 10*3/uL (ref 150–400)
RBC: 4.32 MIL/uL (ref 3.87–5.11)
RDW: 14.2 % (ref 11.5–15.5)
WBC: 10.3 10*3/uL (ref 4.0–10.5)

## 2012-07-09 LAB — GLUCOSE, CAPILLARY
Glucose-Capillary: 111 mg/dL — ABNORMAL HIGH (ref 70–99)
Glucose-Capillary: 69 mg/dL — ABNORMAL LOW (ref 70–99)
Glucose-Capillary: 70 mg/dL (ref 70–99)
Glucose-Capillary: 83 mg/dL (ref 70–99)
Glucose-Capillary: 66 mg/dL — ABNORMAL LOW (ref 70–99)
Glucose-Capillary: 77 mg/dL (ref 70–99)
Glucose-Capillary: 83 mg/dL (ref 70–99)
Glucose-Capillary: 171 mg/dL — ABNORMAL HIGH (ref 70–99)

## 2012-07-09 LAB — RPR: RPR Ser Ql: NONREACTIVE

## 2012-07-09 MED ORDER — PHENYLEPHRINE 40 MCG/ML (10ML) SYRINGE FOR IV PUSH (FOR BLOOD PRESSURE SUPPORT): 80.0000 ug | PREFILLED_SYRINGE | INTRAVENOUS | Status: DC | PRN

## 2012-07-09 MED ORDER — GLUCOSE 40 % PO GEL
ORAL | Status: AC
  Administered 2012-07-09: 14:00:00
  Filled 2012-07-09: qty 0.83

## 2012-07-09 MED ORDER — LACTATED RINGERS IV SOLN
500.0000 mL | INTRAVENOUS | Status: DC | PRN
  Administered 2012-07-09 (×2): 500 mL via INTRAVENOUS

## 2012-07-09 MED ORDER — ZOLPIDEM TARTRATE 5 MG PO TABS: 5.0000 mg | ORAL_TABLET | Freq: Every evening | ORAL | Status: DC | PRN

## 2012-07-09 MED ORDER — TETANUS-DIPHTH-ACELL PERTUSSIS 5-2.5-18.5 LF-MCG/0.5 IM SUSP: 0.5000 mL | Freq: Once | INTRAMUSCULAR | Status: DC

## 2012-07-09 MED ORDER — ACETAMINOPHEN 325 MG PO TABS: 650.0000 mg | ORAL_TABLET | ORAL | Status: DC | PRN

## 2012-07-09 MED ORDER — SIMETHICONE 80 MG PO CHEW: 80.0000 mg | CHEWABLE_TABLET | ORAL | Status: DC | PRN

## 2012-07-09 MED ORDER — OXYTOCIN 40 UNITS IN LACTATED RINGERS INFUSION - SIMPLE MED
1.0000 m[IU]/min | INTRAVENOUS | Status: DC
  Administered 2012-07-09: 2 m[IU]/min via INTRAVENOUS
  Filled 2012-07-09: qty 1000

## 2012-07-09 MED ORDER — EPHEDRINE 5 MG/ML INJ: 10.0000 mg | INTRAVENOUS | Status: DC | PRN

## 2012-07-09 MED ORDER — ONDANSETRON HCL 4 MG/2ML IJ SOLN: 4.0000 mg | Freq: Four times a day (QID) | INTRAMUSCULAR | Status: DC | PRN

## 2012-07-09 MED ORDER — BENZOCAINE-MENTHOL 20-0.5 % EX AERO
1.0000 "application " | INHALATION_SPRAY | CUTANEOUS | Status: DC | PRN
  Administered 2012-07-09: 1 via TOPICAL
  Filled 2012-07-09: qty 56

## 2012-07-09 MED ORDER — PRENATAL MULTIVITAMIN CH
1.0000 | ORAL_TABLET | Freq: Every day | ORAL | Status: DC
  Administered 2012-07-10: 1 via ORAL
  Filled 2012-07-09: qty 1

## 2012-07-09 MED ORDER — PHENYLEPHRINE 40 MCG/ML (10ML) SYRINGE FOR IV PUSH (FOR BLOOD PRESSURE SUPPORT)
80.0000 ug | PREFILLED_SYRINGE | INTRAVENOUS | Status: DC | PRN
  Administered 2012-07-09: 80 ug via INTRAVENOUS

## 2012-07-09 MED ORDER — DIBUCAINE 1 % RE OINT: 1.0000 "application " | TOPICAL_OINTMENT | RECTAL | Status: DC | PRN

## 2012-07-09 MED ORDER — SENNOSIDES-DOCUSATE SODIUM 8.6-50 MG PO TABS
2.0000 | ORAL_TABLET | Freq: Every day | ORAL | Status: DC
  Administered 2012-07-09 – 2012-07-10 (×2): 2 via ORAL

## 2012-07-09 MED ORDER — ONDANSETRON HCL 4 MG/2ML IJ SOLN: 4.0000 mg | INTRAMUSCULAR | Status: DC | PRN

## 2012-07-09 MED ORDER — FENTANYL 2.5 MCG/ML BUPIVACAINE 1/10 % EPIDURAL INFUSION (WH - ANES)
INTRAMUSCULAR | Status: AC
  Filled 2012-07-09: qty 125

## 2012-07-09 MED ORDER — LIDOCAINE HCL (PF) 1 % IJ SOLN
INTRAMUSCULAR | Status: DC | PRN
  Administered 2012-07-09 (×2): 5 mL

## 2012-07-09 MED ORDER — OXYTOCIN 40 UNITS IN LACTATED RINGERS INFUSION - SIMPLE MED: 62.5000 mL/h | INTRAVENOUS | Status: DC

## 2012-07-09 MED ORDER — DIPHENHYDRAMINE HCL 25 MG PO CAPS: 25.0000 mg | ORAL_CAPSULE | Freq: Four times a day (QID) | ORAL | Status: DC | PRN

## 2012-07-09 MED ORDER — OXYTOCIN BOLUS FROM INFUSION: 500.0000 mL | INTRAVENOUS | Status: DC

## 2012-07-09 MED ORDER — PHENYLEPHRINE 40 MCG/ML (10ML) SYRINGE FOR IV PUSH (FOR BLOOD PRESSURE SUPPORT)
PREFILLED_SYRINGE | INTRAVENOUS | Status: AC
  Filled 2012-07-09: qty 5

## 2012-07-09 MED ORDER — WITCH HAZEL-GLYCERIN EX PADS: 1.0000 "application " | MEDICATED_PAD | CUTANEOUS | Status: DC | PRN

## 2012-07-09 MED ORDER — FLEET ENEMA 7-19 GM/118ML RE ENEM: 1.0000 | ENEMA | RECTAL | Status: DC | PRN

## 2012-07-09 MED ORDER — LANOLIN HYDROUS EX OINT: TOPICAL_OINTMENT | CUTANEOUS | Status: DC | PRN

## 2012-07-09 MED ORDER — DIPHENHYDRAMINE HCL 50 MG/ML IJ SOLN: 12.5000 mg | INTRAMUSCULAR | Status: DC | PRN

## 2012-07-09 MED ORDER — CITRIC ACID-SODIUM CITRATE 334-500 MG/5ML PO SOLN: 30.0000 mL | ORAL | Status: DC | PRN

## 2012-07-09 MED ORDER — TERBUTALINE SULFATE 1 MG/ML IJ SOLN: 0.2500 mg | Freq: Once | INTRAMUSCULAR | Status: DC | PRN

## 2012-07-09 MED ORDER — LACTATED RINGERS IV SOLN: 500.0000 mL | Freq: Once | INTRAVENOUS | Status: DC

## 2012-07-09 MED ORDER — LACTATED RINGERS IV SOLN
INTRAVENOUS | Status: DC
  Administered 2012-07-09: 13:00:00 via INTRAVENOUS

## 2012-07-09 MED ORDER — ONDANSETRON HCL 4 MG PO TABS: 4.0000 mg | ORAL_TABLET | ORAL | Status: DC | PRN

## 2012-07-09 MED ORDER — LIDOCAINE HCL (PF) 1 % IJ SOLN
30.0000 mL | INTRAMUSCULAR | Status: DC | PRN
  Administered 2012-07-09: 30 mL via SUBCUTANEOUS
  Filled 2012-07-09: qty 30

## 2012-07-09 MED ORDER — FENTANYL 2.5 MCG/ML BUPIVACAINE 1/10 % EPIDURAL INFUSION (WH - ANES)
14.0000 mL/h | INTRAMUSCULAR | Status: DC
  Administered 2012-07-09: 14 mL/h via EPIDURAL

## 2012-07-09 MED ORDER — EPHEDRINE 5 MG/ML INJ
INTRAVENOUS | Status: AC
  Filled 2012-07-09: qty 4

## 2012-07-09 MED ORDER — OXYCODONE-ACETAMINOPHEN 5-325 MG PO TABS
1.0000 | ORAL_TABLET | ORAL | Status: DC | PRN
  Administered 2012-07-09 – 2012-07-10 (×5): 1 via ORAL
  Filled 2012-07-09 (×5): qty 1

## 2012-07-09 MED ORDER — OXYCODONE-ACETAMINOPHEN 5-325 MG PO TABS: 1.0000 | ORAL_TABLET | ORAL | Status: DC | PRN

## 2012-07-09 NOTE — Op Note (Signed)
Per RN, pt progressed rapidly from 8-9cm and began having strong urge to push with delivery in a single push.  Secondary to rapid second stage, I arrived to LDR 30 seconds after delivery of VMI per delivering RN.  Per RN, head and body delivered atraumatically.  Mouth and nose were bulb suctioned.  On my arrival, cord was being clamped, cut and baby placed on abdomen.  I collected cord pH and delivered placenta S/I/3VC.  Fundus firmed with pitocin and massage.  Left periurethral and vaginal sidewall x 2 lacerations were repaired with 3-0 Rapide in a running fashion after infiltration with local.  Mom and baby stable.  EBL:  350cc.  APGARs 8,9.

## 2012-07-09 NOTE — Progress Notes (Signed)
MD notified of pr CBG--orders to allow pt to have snack and recheck CBG--pt asymptomatic

## 2012-07-09 NOTE — H&P (Signed)
Tonya [name] is a 27 y.o. female presenting for induction of labor secondary to A2DM at 39 weeks with favorable cervix.  Rare CTX, no LOF, +FM.   Maternal Medical History:  Contractions: Onset was 1 week ago.   Frequency: rare.   Perceived severity is mild.    Fetal activity: Perceived fetal activity is normal.   Last perceived fetal movement was within the past hour.    Prenatal complications: no prenatal complications Prenatal Complications - Diabetes: gestational. Diabetes is managed by oral agent (monotherapy).      OB History    Grav Para Term Preterm Abortions TAB SAB Ect Mult Living   1 0 0 0 0 0 0 0 0 0      Past Medical History  Diagnosis Date  . Hypertension     no meds  . Gestational diabetes     oral meds  . Headache   . Frequent UTI   . PONV (postoperative nausea and vomiting)    Past Surgical History  Procedure Date  . Spine surgery 03/06/11    lumbar disc L5  . Shoulder surgery 2008, 2009    L shoulder, acromion shave and recurrent dislocation.  . Breast surgery     removed a lump   Family History: family history includes Diabetes in her maternal grandfather and paternal uncle; Hyperlipidemia in her paternal grandfather; Hypertension in her maternal grandfather, maternal grandmother, paternal grandfather, and paternal grandmother; Obesity in her other; Sleep apnea in her other; Stroke in her maternal grandfather; and Thrombophlebitis in her paternal grandfather. Social History:  reports that she has never smoked. She has never used smokeless tobacco. She reports that she does not drink alcohol or use illicit drugs.   Prenatal Transfer Tool  Maternal Diabetes: Yes:  Diabetes Type:  Insulin/Medication controlled Genetic Screening: Normal Maternal Ultrasounds/Referrals: Normal Fetal Ultrasounds or other Referrals:  None Maternal Substance Abuse:  No Significant Maternal Medications:  Meds include: Other: Glyburide Significant Maternal Lab Results:   None Other Comments:  None  ROS  Dilation: 5 Effacement (%): 50 Station: -3 Exam by:: Dr. Langston Masker Blood pressure 128/91, pulse 103, temperature 98.3 F (36.8 C), temperature source Oral, height 5\' 5"  (1.651 m), weight 201 lb (91.173 kg), last menstrual period 01/26/2011. Maternal Exam:  Uterine Assessment: Contraction strength is mild.  Contraction frequency is rare.   Abdomen: Patient reports no abdominal tenderness. Fundal height is c/w dates.   Estimated fetal weight is 8#.   Fetal presentation: vertex  Introitus: Normal vulva. Normal vagina.  Pelvis: adequate for delivery.   Cervix: Cervix evaluated by digital exam.     Physical Exam  Constitutional: She is oriented to person, place, and time. She appears well-developed and well-nourished.  GI: Soft.  Neurological: She is alert and oriented to person, place, and time.  Skin: Skin is warm and dry.  Psychiatric: She has a normal mood and affect. Her behavior is normal.    Prenatal labs: ABO, Rh: O/Positive/-- (06/28 0000) Antibody:   Rubella: Immune (06/28 0000) RPR: Nonreactive (06/28 0000)  HBsAg: Negative (06/28 0000)  HIV: Non-reactive (06/28 0000)  GBS: Negative (01/13 0000)   Assessment/Plan: 26yo G1 at [redacted]w[redacted]d for IOL for A2DM -GBS neg -A2DM-fsbs q 2 -Will start pitocin -Epidural when desired   Hensley Aziz 07/09/2012, 8:31 AM

## 2012-07-09 NOTE — Anesthesia Preprocedure Evaluation (Signed)
Anesthesia Evaluation  Patient identified by MRN, date of birth, ID band Patient awake    Reviewed: Allergy & Precautions, H&P , Patient's Chart, lab work & pertinent test results  History of Anesthesia Complications (+) PONV  Airway Mallampati: II TM Distance: >3 FB Neck ROM: full    Dental No notable dental hx.    Pulmonary neg pulmonary ROS,  breath sounds clear to auscultation  Pulmonary exam normal       Cardiovascular hypertension, negative cardio ROS  Rhythm:regular Rate:Normal     Neuro/Psych  Headaches, negative neurological ROS  negative psych ROS   GI/Hepatic negative GI ROS, Neg liver ROS,   Endo/Other  negative endocrine ROSdiabetes  Renal/GU negative Renal ROS     Musculoskeletal   Abdominal   Peds  Hematology negative hematology ROS (+)   Anesthesia Other Findings   Reproductive/Obstetrics (+) Pregnancy                           Anesthesia Physical Anesthesia Plan  ASA: III  Anesthesia Plan: Epidural   Post-op Pain Management:    Induction:   Airway Management Planned:   Additional Equipment:   Intra-op Plan:   Post-operative Plan:   Informed Consent: I have reviewed the patients History and Physical, chart, labs and discussed the procedure including the risks, benefits and alternatives for the proposed anesthesia with the patient or authorized representative who has indicated his/her understanding and acceptance.     Plan Discussed with:   Anesthesia Plan Comments:         Anesthesia Quick Evaluation

## 2012-07-09 NOTE — Progress Notes (Signed)
Infant grunting--pulse ox on and o2 at 98-100% HR 115-125--attempted to get infant to breast grunting continued o2 decreased to 82% unable to sustain latch--nursery notified and asked to assess infant

## 2012-07-09 NOTE — Anesthesia Procedure Notes (Signed)
Epidural Patient location during procedure: OB Start time: 07/09/2012 1:07 PM  Staffing Anesthesiologist: Angus Seller., Harrell Gave. Performed by: anesthesiologist   Preanesthetic Checklist Completed: patient identified, site marked, surgical consent, pre-op evaluation, timeout performed, IV checked, risks and benefits discussed and monitors and equipment checked  Epidural Patient position: sitting Prep: site prepped and draped and DuraPrep Patient monitoring: continuous pulse ox and blood pressure Approach: midline Injection technique: LOR air and LOR saline  Needle:  Needle type: Tuohy  Needle gauge: 17 G Needle length: 9 cm and 9 Needle insertion depth: 5 cm cm Catheter type: closed end flexible Catheter size: 19 Gauge Catheter at skin depth: 10 cm Test dose: negative  Assessment Events: blood not aspirated, injection not painful, no injection resistance, negative IV test and no paresthesia  Additional Notes Patient identified.  Risk benefits discussed including failed block, incomplete pain control, headache, nerve damage, paralysis, blood pressure changes, nausea, vomiting, reactions to medication both toxic or allergic, and postpartum back pain.  Patient expressed understanding and wished to proceed.  All questions were answered.  Sterile technique used throughout procedure and epidural site dressed with sterile barrier dressing. No paresthesia or other complications noted.The patient did not experience any signs of intravascular injection such as tinnitus or metallic taste in mouth nor signs of intrathecal spread such as rapid motor block. Please see nursing notes for vital signs.

## 2012-07-10 LAB — CBC
Hemoglobin: 11.6 g/dL — ABNORMAL LOW (ref 12.0–15.0)
Platelets: 198 10*3/uL (ref 150–400)
RBC: 3.75 MIL/uL — ABNORMAL LOW (ref 3.87–5.11)
WBC: 14.1 10*3/uL — ABNORMAL HIGH (ref 4.0–10.5)

## 2012-07-10 NOTE — Anesthesia Postprocedure Evaluation (Signed)
Anesthesia Post Note  Patient: Tonya [name]  Procedure(s) Performed: * No procedures listed *  Anesthesia type: Epidural  Patient location: Mother/Baby  Post pain: Pain level controlled  Post assessment: Post-op Vital signs reviewed  Last Vitals:  Filed Vitals:   07/10/12 0747  BP: 120/78  Pulse: 67  Temp: 36.6 C  Resp: 20    Post vital signs: Reviewed  Level of consciousness:alert  Complications: No apparent anesthesia complications

## 2012-07-10 NOTE — Progress Notes (Signed)
Post Partum Day 1 Subjective: no complaints, up ad lib, voiding and tolerating PO.  Requests circumcision.  Discussed R/B/A.    Objective: Blood pressure 120/78, pulse 67, temperature 97.9 F (36.6 C), temperature source Oral, resp. rate 20, height 5\' 5"  (1.651 m), weight 201 lb (91.173 kg), last menstrual period 01/26/2011, SpO2 100.00%, unknown if currently breastfeeding.  Physical Exam:  General: alert, cooperative and appears stated age Lochia: appropriate Uterine Fundus: firm Incision: healing well, no significant drainage, no dehiscence DVT Evaluation: No evidence of DVT seen on physical exam. Negative Homan's sign. No cords or calf tenderness.   Basename 07/10/12 0603 07/09/12 0755  HGB 11.6* 13.2  HCT 34.4* 39.3    Assessment/Plan: Plan for discharge tomorrow and Breastfeeding   LOS: 1 day   Ernesteen Mihalic 07/10/2012, 9:48 AM

## 2012-07-11 MED ORDER — OXYCODONE-ACETAMINOPHEN 5-325 MG PO TABS: 1.0000 | ORAL_TABLET | ORAL | Status: DC | PRN

## 2012-07-11 NOTE — Discharge Summary (Signed)
Obstetric Discharge Summary Reason for Admission: induction of labor Prenatal Procedures: ultrasound Intrapartum Procedures: spontaneous vaginal delivery Postpartum Procedures: none Complications-Operative and Postpartum: 1 degree perineal laceration Hemoglobin  Date Value Range Status  07/10/2012 11.6* 12.0 - 15.0 g/dL Final     HCT  Date Value Range Status  07/10/2012 34.4* 36.0 - 46.0 % Final    Physical Exam:  General: alert and cooperative Lochia: appropriate Uterine Fundus: firm Incision: perineum intact, small ecchymosis noted on labia DVT Evaluation: No evidence of DVT seen on physical exam. No significant calf/ankle edema.  Discharge Diagnoses: Term Pregnancy-delivered  Discharge Information: Date: 07/11/2012 Activity: pelvic rest Diet: routine Medications: PNV and Percocet Condition: stable Instructions: refer to practice specific booklet Discharge to: home   Newborn Data: Live born female  Birth Weight: 7 lb 14.3 oz (3580 g) APGAR: 8, 9  Home with mother.  Jevaughn Degollado G 07/11/2012, 7:55 AM

## 2012-07-15 ENCOUNTER — Inpatient Hospital Stay (HOSPITAL_COMMUNITY): Admission: RE | Admit: 2012-07-15 | Payer: BC Managed Care – PPO | Source: Ambulatory Visit

## 2012-11-08 ENCOUNTER — Emergency Department (INDEPENDENT_AMBULATORY_CARE_PROVIDER_SITE_OTHER): Payer: BC Managed Care – PPO

## 2012-11-08 ENCOUNTER — Emergency Department (HOSPITAL_COMMUNITY)
Admission: EM | Admit: 2012-11-08 | Discharge: 2012-11-08 | Disposition: A | Discharge status: Home / Self Care | Payer: BC Managed Care – PPO | Source: Home / Self Care

## 2012-11-08 ENCOUNTER — Encounter (HOSPITAL_COMMUNITY): Payer: Self-pay | Admitting: Emergency Medicine

## 2012-11-08 ENCOUNTER — Telehealth: Payer: Self-pay | Admitting: Family Medicine

## 2012-11-08 DIAGNOSIS — S93409A Sprain of unspecified ligament of unspecified ankle, initial encounter: Secondary | ICD-10-CM

## 2012-11-08 DIAGNOSIS — S93401A Sprain of unspecified ligament of right ankle, initial encounter: Secondary | ICD-10-CM

## 2012-11-08 DIAGNOSIS — H53149 Visual discomfort, unspecified: Secondary | ICD-10-CM

## 2012-11-08 NOTE — ED Provider Notes (Signed)
History     CSN: [id number]  Arrival date & time 11/08/12  1013   First MD Initiated Contact with Patient 11/08/12 1032      Chief Complaint  Patient presents with  . Ankle Injury    (Consider location/radiation/quality/duration/timing/severity/associated sxs/prior treatment) HPI Comments: 27 year old female who presents with right ankle pain. She was walking out of her garage at home she stepped in a small shallow hole and twisted her right ankle. This occurred 3 days ago. During that time she had minor soreness for 2 days but yesterday the pain got worse with more swelling and pain while continuing to ambulate with full weightbearing. She is complaining of pain that radiates upward along the tendons attached to the lateral malleolus. She is also complaining of some numbness in the foot.  Patient is a 27 y.o. female presenting with lower extremity injury.  Ankle Injury    Past Medical History  Diagnosis Date  . Hypertension     no meds  . Gestational diabetes     oral meds  . Headache(784.0)   . Frequent UTI   . PONV (postoperative nausea and vomiting)     Past Surgical History  Procedure Laterality Date  . Spine surgery  03/06/11    lumbar disc L5  . Shoulder surgery  2008, 2009    L shoulder, acromion shave and recurrent dislocation.  . Breast surgery      removed a lump  . Back surgery      Family History  Problem Relation Age of Onset  . Diabetes Paternal Uncle   . Hypertension Paternal Grandmother   . Obesity Other   . Sleep apnea Other   . Hypertension Maternal Grandmother   . Hypertension Maternal Grandfather   . Diabetes Maternal Grandfather   . Stroke Maternal Grandfather   . Hypertension Paternal Grandfather   . Thrombophlebitis Paternal Grandfather   . Hyperlipidemia Paternal Grandfather     History  Substance Use Topics  . Smoking status: Never Smoker   . Smokeless tobacco: Never Used  . Alcohol Use: No    OB History   Grav Para Term  Preterm Abortions TAB SAB Ect Mult Living   1 1 1  0 0 0 0 0 0 1      Review of Systems  Constitutional: Negative for fever, chills and activity change.  HENT: Negative.   Respiratory: Negative.   Cardiovascular: Negative.   Musculoskeletal:       As per HPI  Skin: Negative for color change, pallor and rash.  Neurological: Negative.     Allergies  Green dyes and Ibuprofen  Home Medications   Current Outpatient Rx  Name  Route  Sig  Dispense  Refill  . acetaminophen (TYLENOL) 325 MG tablet   Oral   Take 325 mg by mouth every 6 (six) hours as needed. Takes for headache         . etonogestrel-ethinyl estradiol (NUVARING) 0.12-0.015 MG/24HR vaginal ring   Vaginal   Place 1 each vaginally every 28 (twenty-eight) days. Insert vaginally and leave in place for 3 consecutive weeks, then remove for 1 week.         Marland Kitchen EXPIRED: eletriptan (RELPAX) 40 MG tablet   Oral   One tablet by mouth as needed for migraine headache.  If the headache improves and then returns, dose may be repeated after 2 hours have elapsed since first dose (do not exceed 80 mg per day). may repeat in 2 hours if necessary  10 tablet   0     Take one tab as needed for migraine, may repeat on ...   . fluticasone (FLONASE) 50 MCG/ACT nasal spray   Nasal   Place 2 sprays into the nose daily.   1 g   1   . oxyCODONE-acetaminophen (PERCOCET/ROXICET) 5-325 MG per tablet   Oral   Take 1-2 tablets by mouth every 4 (four) hours as needed (moderate - severe pain).   30 tablet   0   . Prenatal Vit-Fe Fumarate-FA (PRENATAL MULTIVITAMIN) TABS   Oral   Take 1 tablet by mouth daily.           BP 132/86  Pulse 76  Temp(Src) 99.5 F (37.5 C) (Oral)  Resp 16  SpO2 100%  LMP 10/30/2012  Breastfeeding? No  Physical Exam  Nursing note and vitals reviewed. Constitutional: She is oriented to person, place, and time. She appears well-developed and well-nourished. No distress.  HENT:  Head: Normocephalic and  atraumatic.  Eyes: EOM are normal. Pupils are equal, round, and reactive to light.  Neck: Normal range of motion. Neck supple.  Cardiovascular: Normal rate.   Pulmonary/Chest: Effort normal. No respiratory distress.  Musculoskeletal:  Range of motion the   ankleis within normal limits. There is minor puffiness just distal to the lateral malleolus. No deformity of the foot or the ankle. No discoloration. she has exhibited full weightbearing with minor leg. Pedal pulse 2+. Distal neurovascular motor sensory is intact.  Neurological: She is alert and oriented to person, place, and time. No cranial nerve deficit.  Skin: Skin is warm and dry.  Psychiatric: She has a normal mood and affect.    ED Course  Procedures (including critical care time)  Labs Reviewed - No data to display Dg Ankle Complete Right  11/08/2012   *RADIOLOGY REPORT*  Clinical Data: Right ankle pain following injury on 11/05/2012  RIGHT ANKLE - COMPLETE 3+ VIEW  Comparison: None.  Findings: No acute fracture or dislocation is noted.  No soft tissue abnormality is seen.  IMPRESSION: No acute abnormality noted.   Original Report Authenticated By: Alcide Clever, M.D.     1. Ankle sprain, right, initial encounter       MDM  RICE Where compression wrapped for stability for the next 4-5 days. Limit weightbearing. May continue to apply ice to the area of swelling off and on for the next couple days. Keep elevated when he can. May take ibuprofen or Tylenol as needed for pain  For any worsening new symptoms or problems may return, otherwise followup with your primary care doctor.   Hayden Rasmussen, NP 11/08/12 1131

## 2012-11-08 NOTE — Telephone Encounter (Signed)
[facility] Triage Call Report Triage Record Num: [id number] Operator: Geanie Berlin Patient Name: Tonya [name] Call Date & Time: 11/07/2012 5:14:01PM Patient Phone: [phone] PCP: Evelena Peat Patient Gender: Female PCP Fax : [phone] Patient DOB: 1986-01-13 [facility] Name: [facility] Reason for Call: Emergent Caller: [name]/Patient; PCP: Evelena Peat (Family Practice); CB#: [phone]; Call regarding Injury/Trauma; Stepped in hole outside at night and "rolled" right ankle. Onset: 11/05/12. Afebrile. Reports significant swelling of right ankle 11/07/12 after working all day with noticable. bruising and dull pain radiating to lateral calf up to knee that started in the last 3 hours. Nuvaring/ LMP 10/30/12. Noted some tingling in ankle she related to the edemas and severe pain. Denies numbness. Foot warm to touch. Advised to see [facility] now for new change in sensation and severe pain per Ankle Injury guideline. Protocol(s) Used: Ankle Injury Recommended Outcome per Protocol: See ED Immediately Reason for Outcome: New change in sensation (numb, tingling, or no feeling), change in skin color (pale or blue), feels cool to the touch, severe pain or no pulse below level of injury. Care Advice: ~ Protect the patient from falling or other harm. ~ Another adult should drive. ~ Do not give the patient anything to eat or drink. ~ IMMEDIATE ACTION ~ DO NOT attempt to place any weight on the affected extremity until evaluated by provider. Support injured part in position of comfort above and below the injury to reduce pain and prevent further damage; avoid unnecessary movement. ~ 06/

## 2012-11-08 NOTE — ED Provider Notes (Signed)
Medical screening examination/treatment/procedure(s) were performed by non-physician practitioner and as supervising physician I was immediately available for consultation/collaboration.  Leslee Home, M.D.  Reuben Likes, MD 11/08/12 1350

## 2012-11-08 NOTE — ED Notes (Signed)
Pt states she twisted her ankle Saturday night. Has tried using ice and Tylenol with no real relief. Has a numbness across the top of her foot and pains that radiate to her calf. Has mild bruising and swelling. Today it feels worse and feels a "pop" whenever she walks. Patient is alert and oriented.

## 2013-05-29 ENCOUNTER — Encounter: Payer: Self-pay | Admitting: Family Medicine

## 2013-05-29 ENCOUNTER — Ambulatory Visit (INDEPENDENT_AMBULATORY_CARE_PROVIDER_SITE_OTHER): Payer: 59 | Admitting: Family Medicine

## 2013-05-29 VITALS — BP 124/72 | HR 103 | Temp 98.7°F | Wt 215.0 lb

## 2013-05-29 DIAGNOSIS — B9789 Other viral agents as the cause of diseases classified elsewhere: Secondary | ICD-10-CM

## 2013-05-29 DIAGNOSIS — B349 Viral infection, unspecified: Secondary | ICD-10-CM

## 2013-05-29 MED ORDER — ONDANSETRON 8 MG PO TBDP: 8.0000 mg | ORAL_TABLET | Freq: Three times a day (TID) | ORAL | Status: DC | PRN

## 2013-05-29 MED ORDER — HYDROCODONE-HOMATROPINE 5-1.5 MG/5ML PO SYRP: 5.0000 mL | ORAL_SOLUTION | Freq: Four times a day (QID) | ORAL | Status: AC | PRN

## 2013-05-29 NOTE — Progress Notes (Signed)
   Subjective:    Patient ID: Tonya [name], female    DOB: 04-05-1986, 27 y.o.   MRN: [id number]  HPI Cough for one week.  Yesterday had some vomiting.  No diarrhea.  One episode vomiting today Achy all over.  Only minimal sore throat.   No fever.  No dysuria. Aching just started yesterday.  Symptoms are moderate. Her 27 month old son has somewhat similar symptoms. No skin rash, stiff neck, or any abdominal pain.  Past Medical History  Diagnosis Date  . Hypertension     no meds  . Gestational diabetes     oral meds  . Headache(784.0)   . Frequent UTI   . PONV (postoperative nausea and vomiting)    Past Surgical History  Procedure Laterality Date  . Spine surgery  03/06/11    lumbar disc L5  . Shoulder surgery  2008, 2009    L shoulder, acromion shave and recurrent dislocation.  . Breast surgery      removed a lump  . Back surgery      reports that she has never smoked. She has never used smokeless tobacco. She reports that she does not drink alcohol or use illicit drugs. family history includes Diabetes in her maternal grandfather and paternal uncle; Hyperlipidemia in her paternal grandfather; Hypertension in her maternal grandfather, maternal grandmother, paternal grandfather, and paternal grandmother; Obesity in her other; Sleep apnea in her other; Stroke in her maternal grandfather; Thrombophlebitis in her paternal grandfather. Allergies  Allergen Reactions  . Green Dyes     REACTION: hives  . Ibuprofen     esophagitis      Review of Systems  Constitutional: Positive for fatigue. Negative for fever and chills.  HENT: Negative for sore throat.   Respiratory: Positive for cough. Negative for shortness of breath and wheezing.   Gastrointestinal: Positive for nausea and vomiting. Negative for diarrhea.  Skin: Negative for rash.       Objective:   Physical Exam  Constitutional: She appears well-developed and well-nourished.  HENT:  Right Ear: External ear  normal.  Left Ear: External ear normal.  Mouth/Throat: Oropharynx is clear and moist.  Neck: Neck supple.  Cardiovascular: Normal rate.   Pulmonary/Chest: Effort normal and breath sounds normal. No respiratory distress. She has no wheezes. She has no rales.  Abdominal: Soft. There is no tenderness.  Lymphadenopathy:    She has no cervical adenopathy.  Skin: No rash noted.          Assessment & Plan:  Probable syndrome. Check influenza screen. If negative, treat symptomatically. Influenza screen is negative. Zofran 8 mg every 8 hours as needed for nausea and vomiting. Limited Hycodan cough syrup for severe cough at night. 1 teaspoon each bedtime when necessary. She has tolerated this the past without side effects

## 2013-05-29 NOTE — Patient Instructions (Signed)

## 2013-05-29 NOTE — Progress Notes (Signed)
Pre visit review using our clinic review tool, if applicable. No additional management support is needed unless otherwise documented below in the visit note. 

## 2013-07-31 ENCOUNTER — Encounter: Payer: 59 | Admitting: Family Medicine

## 2013-08-01 ENCOUNTER — Encounter: Payer: 59 | Admitting: Family Medicine

## 2013-08-01 DIAGNOSIS — Z0289 Encounter for other administrative examinations: Secondary | ICD-10-CM

## 2013-10-05 LAB — OB RESULTS CONSOLE ANTIBODY SCREEN: Antibody Screen: NEGATIVE

## 2013-10-05 LAB — OB RESULTS CONSOLE ABO/RH: RH TYPE: POSITIVE

## 2013-10-05 LAB — OB RESULTS CONSOLE GC/CHLAMYDIA
Chlamydia: NEGATIVE
Gonorrhea: NEGATIVE

## 2013-10-05 LAB — OB RESULTS CONSOLE RPR: RPR: NONREACTIVE

## 2013-10-05 LAB — OB RESULTS CONSOLE RUBELLA ANTIBODY, IGM: Rubella: IMMUNE

## 2013-10-05 LAB — OB RESULTS CONSOLE HIV ANTIBODY (ROUTINE TESTING): HIV: NONREACTIVE

## 2013-10-05 LAB — OB RESULTS CONSOLE HEPATITIS B SURFACE ANTIGEN: Hepatitis B Surface Ag: NEGATIVE

## 2013-11-07 ENCOUNTER — Encounter: Payer: Self-pay | Admitting: Physician Assistant

## 2013-11-07 ENCOUNTER — Ambulatory Visit (INDEPENDENT_AMBULATORY_CARE_PROVIDER_SITE_OTHER): Payer: BC Managed Care – PPO | Admitting: Physician Assistant

## 2013-11-07 VITALS — BP 128/80 | HR 98 | Temp 98.5°F | Resp 18 | Wt 213.0 lb

## 2013-11-07 DIAGNOSIS — J0100 Acute maxillary sinusitis, unspecified: Secondary | ICD-10-CM

## 2013-11-07 MED ORDER — AMOXICILLIN-POT CLAVULANATE 875-125 MG PO TABS: 1.0000 | ORAL_TABLET | Freq: Two times a day (BID) | ORAL | Status: DC

## 2013-11-07 NOTE — Progress Notes (Signed)
Pre visit review using our clinic review tool, if applicable. No additional management support is needed unless otherwise documented below in the visit note. 

## 2013-11-07 NOTE — Progress Notes (Signed)
Subjective:    Patient ID: Tonya [name], female    DOB: 03-17-1986, 28 y.o.   MRN: [id number]  Sinusitis This is a new problem. The current episode started in the past 7 days. The problem has been gradually worsening since onset. There has been no fever. Her pain is at a severity of 7/10. The pain is moderate. Associated symptoms include congestion, coughing (thick green mucus), ear pain, headaches, a hoarse voice, shortness of breath, sinus pressure and a sore throat. Pertinent negatives include no chills, diaphoresis, neck pain, sneezing or swollen glands. Past treatments include acetaminophen (benadryl). The treatment provided no relief.      Review of Systems  Constitutional: Negative for fever, chills and diaphoresis.  HENT: Positive for congestion, ear pain, hoarse voice, postnasal drip, sinus pressure and sore throat. Negative for sneezing.   Respiratory: Positive for cough (thick green mucus) and shortness of breath.   Gastrointestinal: Negative for nausea, vomiting and diarrhea.  Musculoskeletal: Negative for neck pain.  Neurological: Positive for headaches.  All other systems reviewed and are negative.    Past Medical History  Diagnosis Date  . Hypertension     no meds  . Gestational diabetes     oral meds  . Headache(784.0)   . Frequent UTI   . PONV (postoperative nausea and vomiting)    Past Surgical History  Procedure Laterality Date  . Spine surgery  03/06/11    lumbar disc L5  . Shoulder surgery  2008, 2009    L shoulder, acromion shave and recurrent dislocation.  . Breast surgery      removed a lump  . Back surgery      reports that she has never smoked. She has never used smokeless tobacco. She reports that she does not drink alcohol or use illicit drugs. family history includes Diabetes in her maternal grandfather and paternal uncle; Hyperlipidemia in her paternal grandfather; Hypertension in her maternal grandfather, maternal grandmother, paternal  grandfather, and paternal grandmother; Obesity in her other; Sleep apnea in her other; Stroke in her maternal grandfather; Thrombophlebitis in her paternal grandfather. Allergies  Allergen Reactions  . Green Dyes     REACTION: hives  . Ibuprofen     esophagitis       Objective:   Physical Exam  Nursing note and vitals reviewed. Constitutional: She is oriented to person, place, and time. She appears well-developed and well-nourished. No distress.  HENT:  Head: Normocephalic and atraumatic.  Right Ear: External ear normal.  Left Ear: External ear normal.  Nose: Nose normal.  Oropharynx is slightly erythematous, no exudate.  Bilateral tympanic membranes are hyperemic.  Right Maxillary sinus exquisitely tender to palpation. Bilateral frontal sinuses and left maxillary sinus nontender to palpation.  Eyes: Conjunctivae and EOM are normal. Pupils are equal, round, and reactive to light.  Neck: Normal range of motion. Neck supple. No JVD present.  Cardiovascular: Normal rate, regular rhythm, normal heart sounds and intact distal pulses.  Exam reveals no gallop and no friction rub.   No murmur heard. Pulmonary/Chest: Effort normal and breath sounds normal. No stridor. No respiratory distress. She has no wheezes. She has no rales. She exhibits no tenderness.  Musculoskeletal: Normal range of motion.  Lymphadenopathy:    She has no cervical adenopathy.  Neurological: She is alert and oriented to person, place, and time.  Skin: Skin is warm and dry. No rash noted. She is not diaphoretic. No erythema. No pallor.  Psychiatric: She has a normal mood and  affect. Her behavior is normal. Judgment and thought content normal.   Filed Vitals:   11/07/13 1107  BP: 128/80  Pulse: 98  Temp: 98.5 F (36.9 C)  Resp: 18    Lab Results  Component Value Date   WBC 14.1* 07/10/2012   HGB 11.6* 07/10/2012   HCT 34.4* 07/10/2012   PLT 198 07/10/2012   GLUCOSE 75 07/04/2012   CHOL 219* 01/06/2012   TRIG  153.0* 01/06/2012   HDL 61.30 01/06/2012   LDLDIRECT 141.9 01/06/2012   ALT 12 07/04/2012   AST 17 07/04/2012   NA 133* 07/04/2012   K 3.9 07/04/2012   CL 100 07/04/2012   CREATININE 0.55 07/04/2012   BUN 6 07/04/2012   CO2 19 07/04/2012   TSH 1.01 01/06/2012   INR 0.97 02/26/2011         Assessment & Plan:  Tonya [name] was seen today for cough.  Diagnoses and associated orders for this visit:  Acute maxillary sinusitis - amoxicillin-clavulanate (AUGMENTIN) 875-125 MG per tablet; Take 1 tablet by mouth 2 (two) times daily.    Plan to follow up as needed, or for worsening or persistent symptoms.  Patient Instructions  Augmentin twice a day for 10 days for sinus infection.  Tylenol can help with pain and fever.  Continue saline rinses and saline sprays as this will ultimately help speed recovery time.  Followup as needed, or for worsening or persistent symptoms despite treatment.

## 2013-11-07 NOTE — Patient Instructions (Signed)
Augmentin twice a day for 10 days for sinus infection.  Tylenol can help with pain and fever.  Continue saline rinses and saline sprays as this will ultimately help speed recovery time.  Followup as needed, or for worsening or persistent symptoms despite treatment.   Sinusitis Sinusitis is redness, soreness, and puffiness (inflammation) of the air pockets in the bones of your face (sinuses). The redness, soreness, and puffiness can cause air and mucus to get trapped in your sinuses. This can allow germs to grow and cause an infection.  HOME CARE   Drink enough fluids to keep your pee (urine) clear or pale yellow.  Use a humidifier in your home.  Run a hot shower to create steam in the bathroom. Sit in the bathroom with the door closed. Breathe in the steam 3 4 times a day.  Put a warm, moist washcloth on your face 3 4 times a day, or as told by your doctor.  Use salt water sprays (saline sprays) to wet the thick fluid in your nose. This can help the sinuses drain.  Only take medicine as told by your doctor. GET HELP RIGHT AWAY IF:   Your pain gets worse.  You have very bad headaches.  You are sick to your stomach (nauseous).  You throw up (vomit).  You are very sleepy (drowsy) all the time.  Your face is puffy (swollen).  Your vision changes.  You have a stiff neck.  You have trouble breathing. MAKE SURE YOU:   Understand these instructions.  Will watch your condition.  Will get help right away if you are not doing well or get worse. Document Released: 11/11/2007 Document Revised: 02/17/2012 Document Reviewed: 12/29/2011 [facility] Patient Information 2014 [place], [place].

## 2014-01-15 ENCOUNTER — Encounter (INDEPENDENT_AMBULATORY_CARE_PROVIDER_SITE_OTHER): Payer: BC Managed Care – PPO

## 2014-01-15 ENCOUNTER — Encounter: Payer: Self-pay | Admitting: Interventional Cardiology

## 2014-01-15 ENCOUNTER — Encounter: Payer: Self-pay | Admitting: *Deleted

## 2014-01-15 ENCOUNTER — Ambulatory Visit (INDEPENDENT_AMBULATORY_CARE_PROVIDER_SITE_OTHER): Payer: BC Managed Care – PPO | Admitting: Interventional Cardiology

## 2014-01-15 VITALS — BP 135/84 | HR 95 | Ht 66.0 in | Wt 212.8 lb

## 2014-01-15 DIAGNOSIS — I10 Essential (primary) hypertension: Secondary | ICD-10-CM

## 2014-01-15 DIAGNOSIS — Z3490 Encounter for supervision of normal pregnancy, unspecified, unspecified trimester: Secondary | ICD-10-CM | POA: Insufficient documentation

## 2014-01-15 DIAGNOSIS — R55 Syncope and collapse: Secondary | ICD-10-CM | POA: Insufficient documentation

## 2014-01-15 DIAGNOSIS — Z331 Pregnant state, incidental: Secondary | ICD-10-CM

## 2014-01-15 DIAGNOSIS — IMO0001 Reserved for inherently not codable concepts without codable children: Secondary | ICD-10-CM

## 2014-01-15 DIAGNOSIS — R030 Elevated blood-pressure reading, without diagnosis of hypertension: Secondary | ICD-10-CM

## 2014-01-15 NOTE — Progress Notes (Signed)
Patient ID: Tonya [name], female   DOB: [birth year], 28 y.o.   MRN: [id number]   Date: 01/15/2014 ID: Tonya [name], DOB 03/25/1986, MRN [id number] PCP: Kristian Covey, MD  Reason: Dizziness with ambulation  ASSESSMENT;  1. Dizziness and near syncope, induced by physical activity/walking. Rule out arrhythmia since she notices fast heart pounding while dizzy. He goes away with rest the 2. Elevated blood pressure with actual increase in blood pressure immediately post walking (140/90 at rest increasing to 180/100 immediately post walking 50 yards) 3. Pregnancy, 22 weeks 4. History of hypertension  PLAN:  1. Seven-day continuous ambulatory mild to 2. Consider treating elevated blood pressure. I will defer to the OB/GYN team   SUBJECTIVE: Tonya [name] is a 28 y.o. female who is referred for dizziness and near-syncope. She has a multitude of complaints including dizziness, dyspnea, weakness, fatigue, and fast heart beating that are precipitated by walking 25-50 yards. The dizziness frightened her because it causes her to feel as though she may faint. 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 She just concerned when she is showing the properties, walking, and begins to feel dizzy. She worries that she may fall. There also times when she is on properties alone and this is also frightening. She denies, orthopnea, PND, lower extremity edema, chest pain, and palpitations that occur at rest. She does have some arm numbness when she sleeps at night. Changing position helps it to go away. She has a history of elevated blood pressure but states that it went down during her first pregnancy.  Allergies  Allergen Reactions  . Green Dyes     REACTION: hives  . Ibuprofen     esophagitis    Current Outpatient Prescriptions on File Prior to Visit  Medication Sig Dispense Refill  . acetaminophen (TYLENOL) 325 MG tablet Take 325 mg by mouth every 6 (six) hours as needed. Takes for headache        No current facility-administered medications on file prior to visit.    Past Medical History  Diagnosis Date  . Hypertension     no meds  . Gestational diabetes     (H/O)   oral meds  . Headache(784.0)   . Frequent UTI   . PONV (postoperative nausea and vomiting)   . Dizziness   . Hand tingling     And feet  . Cystitis     Occas    Past Surgical History  Procedure Laterality Date  . Spine surgery  03/06/11    lumbar disc L5  . Shoulder surgery  2008, 2009    L shoulder, acromion shave and recurrent dislocation.  . Breast lumpectomy Left     Benign  . Back surgery  2012    History   Social History  . Marital Status: Married    Spouse Name: N/A    Number of Children: N/A  . Years of Education: N/A   Occupational History  . Not on file.   Social History Main Topics  . Smoking status: Never Smoker   . Smokeless tobacco: Never Used  . Alcohol Use: No  . Drug Use: No  . Sexual Activity: Not Currently    Birth Control/ Protection: None   Other Topics Concern  . Not on file   Social History Narrative  . No narrative on file    Family History  Problem Relation Age of Onset  . Diabetes Paternal Uncle   . Hypertension Paternal Grandmother   . Obesity Other   .  Sleep apnea Other   . Hypertension Maternal Grandmother   . Hypertension Maternal Grandfather   . Diabetes Maternal Grandfather   . Stroke Maternal Grandfather   . Hypertension Paternal Grandfather   . Thrombophlebitis Paternal Grandfather   . Hyperlipidemia Paternal Grandfather     ROS: No prior history of frank syncope. She denies heart illness. No family history of sudden death. Never been told she has a heart murmur or an abnormal EKG. There are no exertional limitations. Her physical activity when not pregnant is full and on restricted. Other systems negative for complaints.  OBJECTIVE: BP 135/84  Pulse 95  Ht 5\' 6"  (1.676 m)  Wt 212 lb 12.8 oz (96.525 kg)  BMI 34.36 kg/m2,  General:  No acute distress, somewhat pale-appearing and obviously pregnant HEENT: normal no jaundice or pallor Neck: JVD seen just above the clavicle while lying at 45. Carotids absent with 2+ upstroke Chest: Clear Cardiac: Murmur: 1 of 6 systolic murmur right upper sternal border. Gallop: None. Rhythm: Normal. Other: None Abdomen: Bruit: Absent. Pulsation: Absent. Obvious pregnancy Extremities: Edema: Absent. Pulses: 2+ and symmetric Neuro: Normal Psych: Normal with the exception of some anxiety when we talk about fainting  ECG: Sinus rhythm at 95 beats per minute the

## 2014-01-15 NOTE — Progress Notes (Signed)
Patient ID: Tonya BrickHeather H [name], female   DOB: 01/10/1986, 28 y.o.   MRN: [id number] [employer] 7 day cardiac event monitor applied to patient.

## 2014-01-15 NOTE — Patient Instructions (Signed)
Your physician recommends that you continue on your current medications as directed. Please refer to the Current Medication list given to you today.  Your physician has recommended that you wear an event monitor. Event monitors are medical devices that record the heart's electrical activity. Doctors most often us these monitors to diagnose arrhythmias. Arrhythmias are problems with the speed or rhythm of the heartbeat. The monitor is a small, portable device. You can wear one while you do your normal daily activities. This is usually used to diagnose what is causing palpitations/syncope (passing out).( patient need a 7 day monitor)   Your physician recommends that you schedule a follow-up appointment as needed

## 2014-01-30 ENCOUNTER — Telehealth: Payer: Self-pay | Admitting: Interventional Cardiology

## 2014-01-30 NOTE — Telephone Encounter (Signed)
New message  ° ° °Patient calling for test results.   °

## 2014-01-30 NOTE — Telephone Encounter (Signed)
pt aware I received cardiac monitor report today, Dr.Smith is not in the office. once monitor report is reviewed by him I will call her back to discuss. pt verbalized understanding.

## 2014-02-06 ENCOUNTER — Telehealth: Payer: Self-pay | Admitting: Interventional Cardiology

## 2014-02-06 NOTE — Telephone Encounter (Signed)
Monitor faxed to pcp while pt was in the office

## 2014-02-06 NOTE — Telephone Encounter (Signed)
Follow up:    Pt would like a call back about her Monitor results please.

## 2014-04-09 ENCOUNTER — Encounter: Payer: Self-pay | Admitting: Interventional Cardiology

## 2014-04-10 ENCOUNTER — Encounter: Payer: Self-pay | Admitting: Family Medicine

## 2014-04-10 ENCOUNTER — Ambulatory Visit (INDEPENDENT_AMBULATORY_CARE_PROVIDER_SITE_OTHER): Payer: BC Managed Care – PPO | Admitting: Family Medicine

## 2014-04-10 VITALS — BP 118/86 | HR 103 | Temp 98.2°F | Ht 66.0 in | Wt 216.4 lb

## 2014-04-10 DIAGNOSIS — H73012 Bullous myringitis, left ear: Secondary | ICD-10-CM

## 2014-04-10 DIAGNOSIS — H66002 Acute suppurative otitis media without spontaneous rupture of ear drum, left ear: Secondary | ICD-10-CM

## 2014-04-10 MED ORDER — AZITHROMYCIN 250 MG PO TABS: ORAL_TABLET | ORAL | Status: DC

## 2014-04-10 NOTE — Progress Notes (Signed)
Pre visit review using our clinic review tool, if applicable. No additional management support is needed unless otherwise documented below in the visit note. 

## 2014-04-10 NOTE — Progress Notes (Addendum)
Ear pain HPI:  -started: 4 days ago -symptoms:nasal congestion, sore throat, cough, now with pain in [name] ear -denies:fever, SOB, NVD, tooth pain -has tried: flonase and delsum -sick contacts/travel/risks: denies flu exposure, tick exposure or or Ebola risks -Hx of: allergies -8 months pregnant  ROS: See pertinent positives and negatives per HPI.  Past Medical History  Diagnosis Date  . Hypertension     no meds  . Gestational diabetes     (H/O)   oral meds  . Headache(784.0)   . Frequent UTI   . PONV (postoperative nausea and vomiting)   . Dizziness   . Hand tingling     And feet  . Cystitis     Occas    Past Surgical History  Procedure Laterality Date  . Spine surgery  03/06/11    lumbar disc L5  . Shoulder surgery  2008, 2009    L shoulder, acromion shave and recurrent dislocation.  . Breast lumpectomy Left     Benign  . Back surgery  2012    Family History  Problem Relation Age of Onset  . Diabetes Paternal Uncle   . Hypertension Paternal Grandmother   . Obesity Other   . Sleep apnea Other   . Hypertension Maternal Grandmother   . Hypertension Maternal Grandfather   . Diabetes Maternal Grandfather   . Stroke Maternal Grandfather   . Hypertension Paternal Grandfather   . Thrombophlebitis Paternal Grandfather   . Hyperlipidemia Paternal Grandfather     History   Social History  . Marital Status: Married    Spouse Name: N/A    Number of Children: N/A  . Years of Education: N/A   Social History Main Topics  . Smoking status: Never Smoker   . Smokeless tobacco: Never Used  . Alcohol Use: No  . Drug Use: No  . Sexual Activity: Not Currently    Birth Control/ Protection: None   Other Topics Concern  . None   Social History Narrative    Current outpatient prescriptions: acetaminophen (TYLENOL) 325 MG tablet, Take 325 mg by mouth every 6 (six) hours as needed. Takes for headache, Disp: , Rfl: ;  Prenatal Vit-Fe Fumarate-FA (PRENATAL VITAMIN PO), Take  1 tablet by mouth daily., Disp: , Rfl: ;  azithromycin (ZITHROMAX) 250 MG tablet, 2 tabs day 1, then one tab daily, Disp: 6 tablet, Rfl: 0  EXAM:  Filed Vitals:   04/10/14 1555  BP: 118/86  Pulse: 103  Temp: 98.2 F (36.8 C)    Body mass index is 34.94 kg/(m^2).  GENERAL: vitals reviewed and listed above, alert, oriented, appears well hydrated and in no acute distress  HEENT: atraumatic, conjunttiva clear, no obvious abnormalities on inspection of external nose and ears, normal appearance of ear canals and TMs except for bulging erythematous  LTM with effusion and bullous lesions, clear nasal congestion, mild post oropharyngeal erythema with PND, no tonsillar edema or exudate, no sinus TTP  NECK: no obvious masses on inspection  LUNGS: clear to auscultation bilaterally, no wheezes, rales or rhonchi, good air movement  CV: HRRR, no peripheral edema  MS: moves all extremities without noticeable abnormality  PSYCH: pleasant and cooperative, no obvious depression or anxiety  ASSESSMENT AND PLAN:  Discussed the following assessment and plan:  Acute suppurative otitis media of left ear without spontaneous rupture of tympanic membrane, recurrence not specified  Bullous myringitis, left - Plan: azithromycin (ZITHROMAX) 250 MG tablet  -we discussed possible serious and likely etiologies, workup and treatment, treatment  risks and return precautions -after this discussion, Tonya [name] opted for azithromycin -of course, we advised Tonya [name]  to return or notify a doctor immediately if symptoms worsen or persist or new concerns arise.  -of course, we advised to return or notify a doctor immediately if symptoms worsen or persist or new concerns arise.    There are no Patient Instructions on file for this visit.   Tonya BasqueKIM, Tonya [name].

## 2014-04-12 LAB — OB RESULTS CONSOLE GBS: STREP GROUP B AG: POSITIVE

## 2014-04-23 ENCOUNTER — Inpatient Hospital Stay (HOSPITAL_COMMUNITY)
Admission: AD | Admit: 2014-04-23 | Discharge: 2014-04-23 | Disposition: A | Discharge status: Home / Self Care | Payer: BC Managed Care – PPO | Source: Ambulatory Visit | Attending: Obstetrics and Gynecology | Admitting: Obstetrics and Gynecology

## 2014-04-23 ENCOUNTER — Encounter (HOSPITAL_COMMUNITY): Payer: Self-pay | Admitting: *Deleted

## 2014-04-23 DIAGNOSIS — O471 False labor at or after 37 completed weeks of gestation: Secondary | ICD-10-CM | POA: Insufficient documentation

## 2014-04-23 DIAGNOSIS — Z3A37 37 weeks gestation of pregnancy: Secondary | ICD-10-CM | POA: Insufficient documentation

## 2014-04-23 NOTE — MAU Note (Signed)
Pt states that she has been having contractions every 2 minutes for an hour and a half. Denies LOF, SROM, vaginal bleeding. States positive fetal movement. Denies any complications with pregnancy.

## 2014-04-27 ENCOUNTER — Inpatient Hospital Stay (HOSPITAL_COMMUNITY)
Admission: AD | Admit: 2014-04-27 | Discharge: 2014-04-27 | Disposition: A | Discharge status: Home / Self Care | Payer: BC Managed Care – PPO | Source: Ambulatory Visit | Attending: Obstetrics and Gynecology | Admitting: Obstetrics and Gynecology

## 2014-04-27 ENCOUNTER — Encounter (HOSPITAL_COMMUNITY): Payer: Self-pay

## 2014-04-27 NOTE — MAU Note (Signed)
Pt states has been contracting all day, denies change in frequency or intensity. Pt states her doctor advised her to come into Maternity Admissions for evaluation/labor check.

## 2014-04-27 NOTE — MAU Note (Addendum)
Contractions since 1330. Some dark bloody d/c but no LOF. Was 4cm on WEds

## 2014-04-27 NOTE — MAU Note (Signed)
Notified Dr. Renaldo FiddlerAdkins of vaginal exam, uterine activity and fetal heart tone.  Pt may be discharged to home after 1 hour observation.

## 2014-04-27 NOTE — MAU Note (Signed)
Pt completed Esignature after discharge instructions given and reviewed with pt and spouse.  After signature complete "accept and print" was mistakenly pressed and computer froze.

## 2014-05-02 ENCOUNTER — Telehealth (HOSPITAL_COMMUNITY): Payer: Self-pay | Admitting: *Deleted

## 2014-05-02 ENCOUNTER — Encounter (HOSPITAL_COMMUNITY): Payer: Self-pay | Admitting: *Deleted

## 2014-05-02 NOTE — Telephone Encounter (Signed)
Preadmission screen  

## 2014-05-07 ENCOUNTER — Encounter (HOSPITAL_COMMUNITY): Payer: Self-pay

## 2014-05-07 ENCOUNTER — Encounter (HOSPITAL_COMMUNITY)
Admission: RE | Disposition: A | Discharge status: Home / Self Care | Payer: Self-pay | Source: Ambulatory Visit | Attending: Obstetrics and Gynecology

## 2014-05-07 ENCOUNTER — Inpatient Hospital Stay (HOSPITAL_COMMUNITY): Payer: BC Managed Care – PPO | Admitting: Anesthesiology

## 2014-05-07 ENCOUNTER — Inpatient Hospital Stay (HOSPITAL_COMMUNITY)
Admission: RE | Admit: 2014-05-07 | Discharge: 2014-05-09 | DRG: 767 | Disposition: A | Discharge status: Home / Self Care | Payer: BC Managed Care – PPO | Source: Ambulatory Visit | Attending: Obstetrics and Gynecology | Admitting: Obstetrics and Gynecology

## 2014-05-07 DIAGNOSIS — Z3A39 39 weeks gestation of pregnancy: Secondary | ICD-10-CM | POA: Diagnosis present

## 2014-05-07 DIAGNOSIS — Z9889 Other specified postprocedural states: Secondary | ICD-10-CM | POA: Diagnosis present

## 2014-05-07 DIAGNOSIS — Z3483 Encounter for supervision of other normal pregnancy, third trimester: Secondary | ICD-10-CM | POA: Diagnosis present

## 2014-05-07 HISTORY — PX: TUBAL LIGATION: SHX77

## 2014-05-07 LAB — ABO/RH: ABO/RH(D): O POS

## 2014-05-07 LAB — CBC
HCT: 37.2 % (ref 36.0–46.0)
Hemoglobin: 12.5 g/dL (ref 12.0–15.0)
MCH: 31.2 pg (ref 26.0–34.0)
MCHC: 33.6 g/dL (ref 30.0–36.0)
MCV: 92.8 fL (ref 78.0–100.0)
PLATELETS: 190 10*3/uL (ref 150–400)
RBC: 4.01 MIL/uL (ref 3.87–5.11)
RDW: 14.6 % (ref 11.5–15.5)
WBC: 9.5 10*3/uL (ref 4.0–10.5)

## 2014-05-07 LAB — TYPE AND SCREEN
ABO/RH(D): O POS
ANTIBODY SCREEN: NEGATIVE

## 2014-05-07 LAB — RPR

## 2014-05-07 SURGERY — LIGATION, FALLOPIAN TUBE, POSTPARTUM
Anesthesia: Epidural | Site: Abdomen | Laterality: Bilateral

## 2014-05-07 MED ORDER — TERBUTALINE SULFATE 1 MG/ML IJ SOLN: 0.2500 mg | Freq: Once | INTRAMUSCULAR | Status: DC | PRN

## 2014-05-07 MED ORDER — OXYTOCIN 40 UNITS IN LACTATED RINGERS INFUSION - SIMPLE MED
1.0000 m[IU]/min | INTRAVENOUS | Status: DC
  Administered 2014-05-07: 2 m[IU]/min via INTRAVENOUS
  Filled 2014-05-07: qty 1000

## 2014-05-07 MED ORDER — SENNOSIDES-DOCUSATE SODIUM 8.6-50 MG PO TABS
2.0000 | ORAL_TABLET | ORAL | Status: DC
  Administered 2014-05-08 (×2): 2 via ORAL
  Filled 2014-05-07 (×2): qty 2

## 2014-05-07 MED ORDER — PROPOFOL 10 MG/ML IV BOLUS
INTRAVENOUS | Status: DC | PRN
  Administered 2014-05-07 (×2): 20 mg via INTRAVENOUS

## 2014-05-07 MED ORDER — FENTANYL CITRATE 0.05 MG/ML IJ SOLN
INTRAMUSCULAR | Status: AC
  Filled 2014-05-07: qty 2

## 2014-05-07 MED ORDER — MEASLES, MUMPS & RUBELLA VAC ~~LOC~~ INJ: 0.5000 mL | INJECTION | Freq: Once | SUBCUTANEOUS | Status: DC

## 2014-05-07 MED ORDER — MEPERIDINE HCL 25 MG/ML IJ SOLN
INTRAMUSCULAR | Status: AC
  Filled 2014-05-07: qty 1

## 2014-05-07 MED ORDER — OXYCODONE-ACETAMINOPHEN 5-325 MG PO TABS
2.0000 | ORAL_TABLET | ORAL | Status: DC | PRN
Start: 2014-05-07 — End: 2014-05-07

## 2014-05-07 MED ORDER — CEFAZOLIN SODIUM-DEXTROSE 2-3 GM-% IV SOLR
INTRAVENOUS | Status: AC
  Filled 2014-05-07: qty 50

## 2014-05-07 MED ORDER — MEPERIDINE HCL 25 MG/ML IJ SOLN: 6.2500 mg | INTRAMUSCULAR | Status: DC | PRN

## 2014-05-07 MED ORDER — CITRIC ACID-SODIUM CITRATE 334-500 MG/5ML PO SOLN
30.0000 mL | ORAL | Status: DC | PRN
  Administered 2014-05-07: 30 mL via ORAL
  Filled 2014-05-07: qty 15

## 2014-05-07 MED ORDER — MIDAZOLAM HCL 2 MG/2ML IJ SOLN
INTRAMUSCULAR | Status: AC
  Filled 2014-05-07: qty 2

## 2014-05-07 MED ORDER — TETANUS-DIPHTH-ACELL PERTUSSIS 5-2.5-18.5 LF-MCG/0.5 IM SUSP: 0.5000 mL | Freq: Once | INTRAMUSCULAR | Status: DC

## 2014-05-07 MED ORDER — PROPOFOL 10 MG/ML IV EMUL
INTRAVENOUS | Status: AC
  Filled 2014-05-07: qty 20

## 2014-05-07 MED ORDER — ONDANSETRON HCL 4 MG PO TABS: 4.0000 mg | ORAL_TABLET | ORAL | Status: DC | PRN

## 2014-05-07 MED ORDER — LACTATED RINGERS IV SOLN
INTRAVENOUS | Status: DC | PRN
  Administered 2014-05-07: 17:00:00 via INTRAVENOUS

## 2014-05-07 MED ORDER — 0.9 % SODIUM CHLORIDE (POUR BTL) OPTIME
TOPICAL | Status: DC | PRN
  Administered 2014-05-07: 1000 mL

## 2014-05-07 MED ORDER — ACETAMINOPHEN 325 MG PO TABS: 650.0000 mg | ORAL_TABLET | ORAL | Status: DC | PRN

## 2014-05-07 MED ORDER — WITCH HAZEL-GLYCERIN EX PADS: 1.0000 "application " | MEDICATED_PAD | CUTANEOUS | Status: DC | PRN

## 2014-05-07 MED ORDER — PRENATAL MULTIVITAMIN CH
1.0000 | ORAL_TABLET | Freq: Every day | ORAL | Status: DC
  Administered 2014-05-08: 1 via ORAL
  Filled 2014-05-07: qty 1

## 2014-05-07 MED ORDER — FENTANYL CITRATE 0.05 MG/ML IJ SOLN
INTRAMUSCULAR | Status: DC | PRN
  Administered 2014-05-07 (×2): 50 ug via INTRAVENOUS

## 2014-05-07 MED ORDER — LIDOCAINE HCL (CARDIAC) 20 MG/ML IV SOLN
INTRAVENOUS | Status: AC
  Filled 2014-05-07: qty 5

## 2014-05-07 MED ORDER — HYDROMORPHONE HCL 1 MG/ML IJ SOLN
INTRAMUSCULAR | Status: AC
  Administered 2014-05-07: 0.5 mg via INTRAVENOUS
  Filled 2014-05-07: qty 1

## 2014-05-07 MED ORDER — LIDOCAINE HCL (PF) 1 % IJ SOLN
INTRAMUSCULAR | Status: DC | PRN
  Administered 2014-05-07 (×2): 4 mL

## 2014-05-07 MED ORDER — LIDOCAINE-EPINEPHRINE (PF) 2 %-1:200000 IJ SOLN
INTRAMUSCULAR | Status: DC | PRN
  Administered 2014-05-07 (×4): 5 mL via EPIDURAL

## 2014-05-07 MED ORDER — PHENYLEPHRINE 40 MCG/ML (10ML) SYRINGE FOR IV PUSH (FOR BLOOD PRESSURE SUPPORT)
80.0000 ug | PREFILLED_SYRINGE | INTRAVENOUS | Status: DC | PRN
  Filled 2014-05-07: qty 10

## 2014-05-07 MED ORDER — ONDANSETRON HCL 4 MG/2ML IJ SOLN: 4.0000 mg | Freq: Four times a day (QID) | INTRAMUSCULAR | Status: DC | PRN

## 2014-05-07 MED ORDER — DIBUCAINE 1 % RE OINT: 1.0000 "application " | TOPICAL_OINTMENT | RECTAL | Status: DC | PRN

## 2014-05-07 MED ORDER — BUPIVACAINE HCL (PF) 0.25 % IJ SOLN
INTRAMUSCULAR | Status: AC
  Filled 2014-05-07: qty 30

## 2014-05-07 MED ORDER — BENZOCAINE-MENTHOL 20-0.5 % EX AERO: 1.0000 "application " | INHALATION_SPRAY | CUTANEOUS | Status: DC | PRN

## 2014-05-07 MED ORDER — OXYTOCIN BOLUS FROM INFUSION
500.0000 mL | INTRAVENOUS | Status: DC
  Administered 2014-05-07: 500 mL via INTRAVENOUS

## 2014-05-07 MED ORDER — OXYCODONE-ACETAMINOPHEN 5-325 MG PO TABS
2.0000 | ORAL_TABLET | ORAL | Status: DC | PRN
  Filled 2014-05-07: qty 2

## 2014-05-07 MED ORDER — METOCLOPRAMIDE HCL 5 MG/ML IJ SOLN: 10.0000 mg | Freq: Once | INTRAMUSCULAR | Status: DC | PRN

## 2014-05-07 MED ORDER — OXYCODONE-ACETAMINOPHEN 5-325 MG PO TABS
1.0000 | ORAL_TABLET | ORAL | Status: DC | PRN
  Administered 2014-05-07 – 2014-05-09 (×9): 1 via ORAL
  Filled 2014-05-07 (×9): qty 1

## 2014-05-07 MED ORDER — MIDAZOLAM HCL 2 MG/2ML IJ SOLN
INTRAMUSCULAR | Status: DC | PRN
  Administered 2014-05-07 (×2): 1 mg via INTRAVENOUS

## 2014-05-07 MED ORDER — FENTANYL 2.5 MCG/ML BUPIVACAINE 1/10 % EPIDURAL INFUSION (WH - ANES)
14.0000 mL/h | INTRAMUSCULAR | Status: DC | PRN
  Administered 2014-05-07: 14 mL/h via EPIDURAL
  Filled 2014-05-07: qty 125

## 2014-05-07 MED ORDER — LANOLIN HYDROUS EX OINT: TOPICAL_OINTMENT | CUTANEOUS | Status: DC | PRN

## 2014-05-07 MED ORDER — EPHEDRINE 5 MG/ML INJ: 10.0000 mg | INTRAVENOUS | Status: DC | PRN

## 2014-05-07 MED ORDER — LACTATED RINGERS IV SOLN
INTRAVENOUS | Status: DC
  Administered 2014-05-07 (×2): via INTRAVENOUS

## 2014-05-07 MED ORDER — LIDOCAINE HCL (PF) 1 % IJ SOLN: 30.0000 mL | INTRAMUSCULAR | Status: DC | PRN

## 2014-05-07 MED ORDER — CEFAZOLIN SODIUM 1-5 GM-% IV SOLN: 1.0000 g | Freq: Once | INTRAVENOUS | Status: DC

## 2014-05-07 MED ORDER — PHENYLEPHRINE 40 MCG/ML (10ML) SYRINGE FOR IV PUSH (FOR BLOOD PRESSURE SUPPORT): 80.0000 ug | PREFILLED_SYRINGE | INTRAVENOUS | Status: DC | PRN

## 2014-05-07 MED ORDER — FENTANYL 2.5 MCG/ML BUPIVACAINE 1/10 % EPIDURAL INFUSION (WH - ANES)
INTRAMUSCULAR | Status: DC | PRN
  Administered 2014-05-07: 14 mL/h via EPIDURAL

## 2014-05-07 MED ORDER — DIPHENHYDRAMINE HCL 25 MG PO CAPS: 25.0000 mg | ORAL_CAPSULE | Freq: Four times a day (QID) | ORAL | Status: DC | PRN

## 2014-05-07 MED ORDER — LABETALOL HCL 5 MG/ML IV SOLN
INTRAVENOUS | Status: AC
  Filled 2014-05-07: qty 8

## 2014-05-07 MED ORDER — MEDROXYPROGESTERONE ACETATE 150 MG/ML IM SUSP: 150.0000 mg | INTRAMUSCULAR | Status: DC | PRN

## 2014-05-07 MED ORDER — BUPIVACAINE HCL (PF) 0.25 % IJ SOLN
INTRAMUSCULAR | Status: DC | PRN
  Administered 2014-05-07: 3 mL

## 2014-05-07 MED ORDER — CEFAZOLIN SODIUM-DEXTROSE 2-3 GM-% IV SOLR
2.0000 g | Freq: Once | INTRAVENOUS | Status: AC
  Administered 2014-05-07: 2 g via INTRAVENOUS
  Filled 2014-05-07: qty 50

## 2014-05-07 MED ORDER — PENICILLIN G POTASSIUM 5000000 UNITS IJ SOLR
2.5000 10*6.[IU] | INTRAVENOUS | Status: DC
  Filled 2014-05-07 (×5): qty 2.5

## 2014-05-07 MED ORDER — LACTATED RINGERS IV SOLN
500.0000 mL | Freq: Once | INTRAVENOUS | Status: AC
  Administered 2014-05-07: 500 mL via INTRAVENOUS

## 2014-05-07 MED ORDER — DIPHENHYDRAMINE HCL 50 MG/ML IJ SOLN: 12.5000 mg | INTRAMUSCULAR | Status: DC | PRN

## 2014-05-07 MED ORDER — PENICILLIN G POTASSIUM 5000000 UNITS IJ SOLR
5.0000 10*6.[IU] | Freq: Once | INTRAVENOUS | Status: AC
  Administered 2014-05-07: 5 10*6.[IU] via INTRAVENOUS
  Filled 2014-05-07: qty 5

## 2014-05-07 MED ORDER — IBUPROFEN 600 MG PO TABS: 600.0000 mg | ORAL_TABLET | Freq: Four times a day (QID) | ORAL | Status: DC

## 2014-05-07 MED ORDER — HYDROMORPHONE HCL 1 MG/ML IJ SOLN
0.2500 mg | INTRAMUSCULAR | Status: DC | PRN
  Administered 2014-05-07: 0.5 mg via INTRAVENOUS

## 2014-05-07 MED ORDER — OXYCODONE-ACETAMINOPHEN 5-325 MG PO TABS: 1.0000 | ORAL_TABLET | ORAL | Status: DC | PRN

## 2014-05-07 MED ORDER — SIMETHICONE 80 MG PO CHEW: 80.0000 mg | CHEWABLE_TABLET | ORAL | Status: DC | PRN

## 2014-05-07 MED ORDER — ONDANSETRON HCL 4 MG/2ML IJ SOLN: 4.0000 mg | INTRAMUSCULAR | Status: DC | PRN

## 2014-05-07 MED ORDER — LACTATED RINGERS IV SOLN: 500.0000 mL | INTRAVENOUS | Status: DC | PRN

## 2014-05-07 MED ORDER — OXYTOCIN 40 UNITS IN LACTATED RINGERS INFUSION - SIMPLE MED: 62.5000 mL/h | INTRAVENOUS | Status: DC

## 2014-05-07 MED ORDER — FLEET ENEMA 7-19 GM/118ML RE ENEM: 1.0000 | ENEMA | RECTAL | Status: DC | PRN

## 2014-05-07 MED ORDER — OXYTOCIN 10 UNIT/ML IJ SOLN
INTRAMUSCULAR | Status: AC
  Filled 2014-05-07: qty 4

## 2014-05-07 SURGICAL SUPPLY — 28 items
CHLORAPREP W/TINT 26ML (MISCELLANEOUS) ×6 IMPLANT
CLIP FILSHIE TUBAL LIGA STRL (Clip) ×2 IMPLANT
CLOTH BEACON ORANGE TIMEOUT ST (SAFETY) ×3 IMPLANT
CONTAINER PREFILL 10% NBF 15ML (MISCELLANEOUS) ×6 IMPLANT
DRSG OPSITE POSTOP 3X4 (GAUZE/BANDAGES/DRESSINGS) ×3 IMPLANT
ELECT REM PT RETURN 9FT ADLT (ELECTROSURGICAL) ×3
ELECTRODE REM PT RTRN 9FT ADLT (ELECTROSURGICAL) ×1 IMPLANT
GLOVE BIO SURGEON STRL SZ 6.5 (GLOVE) ×2 IMPLANT
GLOVE BIO SURGEONS STRL SZ 6.5 (GLOVE) ×1
GLOVE BIOGEL PI IND STRL 7.0 (GLOVE) ×1 IMPLANT
GLOVE BIOGEL PI INDICATOR 7.0 (GLOVE) ×2
GOWN STRL REUS W/TWL LRG LVL3 (GOWN DISPOSABLE) ×6 IMPLANT
LIQUID BAND (GAUZE/BANDAGES/DRESSINGS) ×2 IMPLANT
NDL HYPO 25X1 1.5 SAFETY (NEEDLE) IMPLANT
NEEDLE HYPO 25X1 1.5 SAFETY (NEEDLE) ×3 IMPLANT
NS IRRIG 1000ML POUR BTL (IV SOLUTION) ×3 IMPLANT
PACK ABDOMINAL MINOR (CUSTOM PROCEDURE TRAY) ×3 IMPLANT
PENCIL BUTTON HOLSTER BLD 10FT (ELECTRODE) ×3 IMPLANT
SPONGE LAP 4X18 X RAY DECT (DISPOSABLE) ×2 IMPLANT
SUT GUT PLAIN 0 CT-3 TAN 27 (SUTURE) IMPLANT
SUT PLAIN 0 NONE (SUTURE) ×3 IMPLANT
SUT VIC AB 0 CT2 27 (SUTURE) ×3 IMPLANT
SUT VIC AB 3-0 PS2 18 (SUTURE) ×3
SUT VIC AB 3-0 PS2 18XBRD (SUTURE) ×1 IMPLANT
SYR CONTROL 10ML LL (SYRINGE) ×2 IMPLANT
TOWEL OR 17X24 6PK STRL BLUE (TOWEL DISPOSABLE) ×6 IMPLANT
TRAY FOLEY CATH 14FR (SET/KITS/TRAYS/PACK) ×3 IMPLANT
WATER STERILE IRR 1000ML POUR (IV SOLUTION) ×3 IMPLANT

## 2014-05-07 NOTE — Anesthesia Preprocedure Evaluation (Signed)
Anesthesia Evaluation  Patient identified by MRN, date of birth, ID band Patient awake    Reviewed: Allergy & Precautions, H&P , Patient's Chart, lab work & pertinent test results  History of Anesthesia Complications (+) PONV and history of anesthetic complications  Airway Mallampati: III  TM Distance: >3 FB Neck ROM: Full    Dental no notable dental hx. (+) Teeth Intact   Pulmonary neg pulmonary ROS,  breath sounds clear to auscultation  Pulmonary exam normal       Cardiovascular hypertension, Rhythm:Regular Rate:Normal  Tachycardia   Neuro/Psych  Headaches, negative neurological ROS  negative psych ROS   GI/Hepatic Neg liver ROS, GERD-  ,  Endo/Other  diabetes, Well Controlled, GestationalObesity  Renal/GU negative Renal ROS  negative genitourinary   Musculoskeletal negative musculoskeletal ROS (+)   Abdominal (+) + obese,   Peds  Hematology negative hematology ROS (+)   Anesthesia Other Findings   Reproductive/Obstetrics (+) Pregnancy                             Anesthesia Physical Anesthesia Plan  ASA: II  Anesthesia Plan: Epidural   Post-op Pain Management:    Induction:   Airway Management Planned: Natural Airway  Additional Equipment:   Intra-op Plan:   Post-operative Plan:   Informed Consent: I have reviewed the patients History and Physical, chart, labs and discussed the procedure including the risks, benefits and alternatives for the proposed anesthesia with the patient or authorized representative who has indicated his/her understanding and acceptance.     Plan Discussed with: Anesthesiologist  Anesthesia Plan Comments:         Anesthesia Quick Evaluation

## 2014-05-07 NOTE — Anesthesia Postprocedure Evaluation (Signed)
  Anesthesia Post-op Note  Patient: Tonya [name]  Procedure(s) Performed: Lumbar Epidural for L&D  Patient Location: PACU and Mother/Baby  Anesthesia Type:Epidural  Level of Consciousness: awake, alert  and oriented  Airway and Oxygen Therapy: Patient Spontanous Breathing  Post-op Pain: none  Post-op Assessment: Post-op Vital signs reviewed, Patient's Cardiovascular Status Stable, Respiratory Function Stable, Patent Airway, No signs of Nausea or vomiting, Pain level controlled, No headache, No backache, No residual numbness and No residual motor weakness  Post-op Vital Signs: Reviewed and stable  Last Vitals:  Filed Vitals:   05/07/14 1558  BP: 122/80  Pulse: 85  Temp: 36.8 C  Resp: 18    Complications: No apparent anesthesia complications

## 2014-05-07 NOTE — Plan of Care (Signed)
Problem: Consults Goal: Prenatal labs/testing reviewed upon admission Outcome: Completed/Met Date Met:  05/07/14

## 2014-05-07 NOTE — Transfer of Care (Signed)
Immediate Anesthesia Transfer of Care Note  Patient: Tonya [name]  Procedure(s) Performed: Procedure(s): POST PARTUM TUBAL LIGATION (Bilateral)  Patient Location: PACU  Anesthesia Type:Epidural  Level of Consciousness: awake, alert  and oriented  Airway & Oxygen Therapy: Patient Spontanous Breathing  Post-op Assessment: Report given to PACU RN and Post -op Vital signs reviewed and stable  Post vital signs: Reviewed and stable  Complications: No apparent anesthesia complications

## 2014-05-07 NOTE — Anesthesia Preprocedure Evaluation (Signed)
Anesthesia Evaluation  Patient identified by MRN, date of birth, ID band Patient awake    Reviewed: Allergy & Precautions, H&P , Patient's Chart, lab work & pertinent test results  History of Anesthesia Complications (+) PONV and history of anesthetic complications  Airway Mallampati: III  TM Distance: >3 FB Neck ROM: Full    Dental no notable dental hx. (+) Teeth Intact   Pulmonary neg pulmonary ROS,  breath sounds clear to auscultation  Pulmonary exam normal       Cardiovascular hypertension, Rhythm:Regular Rate:Normal  Tachycardia   Neuro/Psych  Headaches, negative neurological ROS  negative psych ROS   GI/Hepatic Neg liver ROS, GERD-  ,  Endo/Other  diabetes, Well Controlled, GestationalObesity  Renal/GU negative Renal ROS  negative genitourinary   Musculoskeletal negative musculoskeletal ROS (+)   Abdominal (+) + obese,   Peds  Hematology negative hematology ROS (+)   Anesthesia Other Findings   Reproductive/Obstetrics Desires permanent sterilization.                             Anesthesia Physical  Anesthesia Plan  ASA: II  Anesthesia Plan: Epidural   Post-op Pain Management:    Induction:   Airway Management Planned: Natural Airway  Additional Equipment:   Intra-op Plan:   Post-operative Plan:   Informed Consent: I have reviewed the patients History and Physical, chart, labs and discussed the procedure including the risks, benefits and alternatives for the proposed anesthesia with the patient or authorized representative who has indicated his/her understanding and acceptance.     Plan Discussed with: Anesthesiologist, CRNA and Surgeon  Anesthesia Plan Comments:         Anesthesia Quick Evaluation

## 2014-05-07 NOTE — Anesthesia Postprocedure Evaluation (Signed)
  Anesthesia Post-op Note  Patient: Tonya [name]  Procedure(s) Performed: Procedure(s): POST PARTUM TUBAL LIGATION (Bilateral)  Patient Location: PACU  Anesthesia Type:Epidural  Level of Consciousness: awake, alert  and oriented  Airway and Oxygen Therapy: Patient Spontanous Breathing  Post-op Pain: none  Post-op Assessment: Post-op Vital signs reviewed, Patient's Cardiovascular Status Stable, Respiratory Function Stable, Patent Airway, No signs of Nausea or vomiting and Pain level controlled  Post-op Vital Signs: Reviewed and stable  Last Vitals:  Filed Vitals:   05/07/14 1806  BP: 137/72  Pulse: 85  Temp: 36.6 C  Resp: 21    Complications: No apparent anesthesia complications

## 2014-05-07 NOTE — Plan of Care (Signed)
Problem: Consults Goal: Automotive engineer Patient Education (See Patient Education module for education specifics.)  Outcome: Completed/Met Date Met:  05/07/14 Goal: Birthing Suites Patient Information Press F2 to bring up selections list  Outcome: Completed/Met Date Met:  05/07/14 Goal: Orientation to unit: Plan of Care Outcome: Completed/Met Date Met:  05/07/14

## 2014-05-07 NOTE — Progress Notes (Signed)
SVD of vigorous female infant w/ apgars of 8,9.  Placenta delivered spontaneous w/ 3VC.   No lacerations  Fundus firm.  EBL 450cc .  Mom and baby stable skin/skin care

## 2014-05-07 NOTE — Plan of Care (Signed)
Problem: Phase II Progression Outcomes Goal: Fetal monitoring per orders Outcome: Completed/Met Date Met:  05/07/14  Problem: Discharge Progression Outcomes Goal: Transfer to Post Partum room/infant to nursery Patient transferred from [facility] unit to OR for tubal ligation.  Patient's nurse accompanied newborn and FOB to MB unit and gave report to patient's MB nurse.

## 2014-05-07 NOTE — Plan of Care (Signed)
Problem: Consults Goal: Orientation to unit: Room Outcome: Completed/Met Date Met:  05/07/14 Goal: Orientation to unit: [place] (smoking, visitation, chaplain services, helpline)  Outcome: Completed/Met Date Met:  05/07/14  Problem: Phase I Progression Outcomes Goal: Assess per MD/Nurse,Routine-VS,FHR,UC,Head to Toe assess Outcome: Completed/Met Date Met:  05/07/14 Goal: Obtain and review prenatal records Outcome: Completed/Met Date Met:  05/07/14 Goal: Induction meds as ordered Outcome: Completed/Met Date Met:  05/07/14

## 2014-05-07 NOTE — H&P (Signed)
Tonya [name] is a 28 y.o. female presenting for IOL.  Pt denies ctx, vb and lof.  Good FM.  Pt with h/o rapid labor.  History OB History    Gravida Para Term Preterm AB TAB SAB Ectopic Multiple Living   2 1 1  0 0 0 0 0 0 1     Past Medical History  Diagnosis Date  . Hypertension     no meds  . Gestational diabetes     (H/O)   oral meds  . Headache(784.0)   . Frequent UTI   . PONV (postoperative nausea and vomiting)   . Dizziness   . Hand tingling     And feet  . Cystitis     Occas  . Rapid heart rate    Past Surgical History  Procedure Laterality Date  . Spine surgery  03/06/11    lumbar disc L5  . Shoulder surgery  2008, 2009    L shoulder, acromion shave and recurrent dislocation.  . Breast lumpectomy Left     Benign  . Back surgery  2012   Family History: family history includes Diabetes in her maternal grandfather and paternal uncle; Hyperlipidemia in her paternal grandfather; Hypertension in her maternal grandfather, maternal grandmother, paternal grandfather, and paternal grandmother; Obesity in her other; Sleep apnea in her other; Stroke in her maternal grandfather; Thrombophlebitis in her paternal grandfather. Social History:  reports that she has never smoked. She has never used smokeless tobacco. She reports that she does not drink alcohol or use illicit drugs.   Prenatal Transfer Tool  Maternal Diabetes: No Genetic Screening: Normal Maternal Ultrasounds/Referrals: Normal Fetal Ultrasounds or other Referrals:  None Maternal Substance Abuse:  No Significant Maternal Medications:  None Significant Maternal Lab Results:  None Other Comments:  None  ROS  Dilation: 4 Effacement (%): 60 Station: -2 Exam by:: Dr Renaldo FiddlerAdkins Blood pressure 158/94, pulse 109, temperature 98.5 F (36.9 C), temperature source Oral, resp. rate 18, height 5\' 6"  (1.676 m), weight 97.07 kg (214 lb), not currently breastfeeding. Exam Physical Exam  gen - NAD Abd - gravid, NT EFW 7  1/2# Ext - NT, trace edema Cvx 4/60/-2 vtx AROM but no fluid return Prenatal labs: ABO, Rh: O/Positive/-- (04/30 0000) Antibody: Negative (04/30 0000) Rubella: Immune (04/30 0000) RPR: Nonreactive (04/30 0000)  HBsAg: Negative (04/30 0000)  HIV: Non-reactive (04/30 0000)  GBS: Positive (11/05 0000)   Assessment/Plan: Admit PCN Pitocin If no LOF with ctx, will reattempt AROM with next exam Epidural prn   Tonya [name] 05/07/2014, 8:00 AM

## 2014-05-07 NOTE — Addendum Note (Signed)
Addendum  created 05/07/14 1853 by Tyrone AppleMichael A. Malen GauzeFoster, MD   Modules edited: Orders

## 2014-05-07 NOTE — Anesthesia Procedure Notes (Signed)
Epidural Patient location during procedure: OB Start time: 05/07/2014 11:40 AM  Staffing Anesthesiologist: Raygen Dahm A. Performed by: anesthesiologist   Preanesthetic Checklist Completed: patient identified, site marked, surgical consent, pre-op evaluation, timeout performed, IV checked, risks and benefits discussed and monitors and equipment checked  Epidural Patient position: sitting Prep: site prepped and draped and DuraPrep Patient monitoring: continuous pulse ox and blood pressure Approach: midline Location: L4-L5 Injection technique: LOR air  Needle:  Needle type: Tuohy  Needle gauge: 17 G Needle length: 9 cm and 9 Needle insertion depth: 4 cm Catheter type: closed end flexible Catheter size: 19 Gauge Catheter at skin depth: 9 cm Test dose: negative and Other  Assessment Events: blood not aspirated, injection not painful, no injection resistance, negative IV test and no paresthesia  Additional Notes Patient identified. Risks and benefits discussed including failed block, incomplete  Pain control, post dural puncture headache, nerve damage, paralysis, blood pressure Changes, nausea, vomiting, reactions to medications-both toxic and allergic and post Partum back pain. All questions were answered. Patient expressed understanding and wished to proceed. Sterile technique was used throughout procedure. Epidural site was Dressed with sterile barrier dressing. No paresthesias, signs of intravascular injection Or signs of intrathecal spread were encountered.  Patient was more comfortable after the epidural was dosed. Please see RN's note for documentation of vital signs and FHR which are stable.

## 2014-05-07 NOTE — Lactation Note (Addendum)
This note was copied from the chart of Girl Dallie DadHeather Noone. Lactation Consultation Note  P2, Ex BF. Recently had tubal. BF first child for 8 months until birth control pills reduced milk supply. Baby latched in cross cradle position upon entering the room.  Provided pillows for support. Rhythmical sucks and swallows observed.  LS9. Mother states she knows how to hand express and has viewed drops of colostrum. Discussed cluster feeding. Mom encouraged to feed baby 8-12 times/24 hours and with feeding cues.  Mom made aware of O/P services, breastfeeding support groups, community resources, and our phone # for post-discharge questions.     Patient Name: Girl Dallie DadHeather Kwan ZOXWR'UToday's Date: 05/07/2014 Reason for consult: Initial assessment   Maternal Data Has patient been taught Hand Expression?: Yes  Feeding Feeding Type: Breast Fed  LATCH Score/Interventions Latch: Grasps breast easily, tongue down, lips flanged, rhythmical sucking.  Audible Swallowing: Spontaneous and intermittent  Type of Nipple: Everted at rest and after stimulation  Comfort (Breast/Nipple): Soft / non-tender     Hold (Positioning): Assistance needed to correctly position infant at breast and maintain latch.  LATCH Score: 9  Lactation Tools Discussed/Used     Consult Status Consult Status: Follow-up Date: 06/05/14 Follow-up type: In-patient    Dahlia ByesBerkelhammer, Nicolo Tomko [facility] 05/07/2014, 9:54 PM

## 2014-05-07 NOTE — Plan of Care (Signed)
Problem: Discharge Progression Outcomes Goal: Transfer to Post Partum room/infant to nursery Outcome: Completed/Met Date Met:  05/07/14

## 2014-05-08 ENCOUNTER — Encounter (HOSPITAL_COMMUNITY): Payer: Self-pay | Admitting: Obstetrics and Gynecology

## 2014-05-08 LAB — CBC
HCT: 39.7 % (ref 36.0–46.0)
Hemoglobin: 13.1 g/dL (ref 12.0–15.0)
MCH: 31 pg (ref 26.0–34.0)
MCHC: 33 g/dL (ref 30.0–36.0)
MCV: 94.1 fL (ref 78.0–100.0)
Platelets: 193 10*3/uL (ref 150–400)
RBC: 4.22 MIL/uL (ref 3.87–5.11)
RDW: 14.5 % (ref 11.5–15.5)
WBC: 13.7 10*3/uL — ABNORMAL HIGH (ref 4.0–10.5)

## 2014-05-08 NOTE — Lactation Note (Signed)
This note was copied from the chart of Tonya Dallie DadHeather Dietz. Lactation Consultation Note  Patient Name: Tonya [name] ZOXWR'UToday's Date: 05/08/2014 Reason for consult: Follow-up assessment (per mom last fed at 0700 for 70 mins . per mom feels comfort able with latching )  Baby is 23 hours old . Per mom originally wanted to go home early and per mom the Baby's doctor decided to hold D/C until tomorrow. Per mom the baby last fed at 0700 for 70 mins , presently dad is holding baby . [facility] and per mom feels  comfort able with latching. Mom denies soreness, LC reviewed sore nipple and engorgement prevention and tx if needed. Per mom has a DEBP at home. Mother informed of post-discharge support and given phone number to the lactation department, including services for phone call assistance; out-patient appointments; and breastfeeding support group. List of other breastfeeding resources in the community given in the handout. Encouraged mother to call for problems or concerns related to breastfeeding. LC will F/U PRN , mom knows to call for assistance for Breast feeding.      Maternal Data    Feeding    LATCH Score/Interventions                Intervention(s): Breastfeeding basics reviewed (and completed Breast feeding teaching )     Lactation Tools Discussed/Used WIC Program: No Pump Review: Milk Storage Initiated by:: MAI  Date initiated:: 05/08/14   Consult Status Consult Status: PRN Date: 05/09/14 Follow-up type: In-patient    Kathrin Greathouseorio, Newel Oien Ann 05/08/2014, 11:57 AM

## 2014-05-08 NOTE — Plan of Care (Signed)
Problem: Discharge Progression Outcomes Goal: Tolerating diet Outcome: Completed/Met Date Met:  05/08/14     

## 2014-05-08 NOTE — Progress Notes (Signed)
Post Partum Day 1 Subjective: no complaints, up ad lib, voiding, tolerating PO, + flatus and Baby noted with facial bruising from rapid delivery. awaiting bilirubin results  Objective: Blood pressure 111/64, pulse 73, temperature 98 F (36.7 C), temperature source Oral, resp. rate 16, height 5\' 6"  (1.676 m), weight 214 lb (97.07 kg), SpO2 98 %, unknown if currently breastfeeding.  Physical Exam:  General: alert and cooperative Lochia: appropriate Uterine Fundus: firm Incision: healing well, umbilical incision CDI DVT Evaluation: No evidence of DVT seen on physical exam. Negative Homan's sign. No cords or calf tenderness. No significant calf/ankle edema.   Recent Labs  05/07/14 0705 05/08/14 0620  HGB 12.5 13.1  HCT 37.2 39.7    Assessment/Plan: Plan for discharge tomorrow   LOS: 1 day   Heiley Shaikh G 05/08/2014, 8:31 AM

## 2014-05-08 NOTE — Anesthesia Postprocedure Evaluation (Signed)
  Anesthesia Post-op Note  Patient: Tonya [name]  Procedure(s) Performed: Procedure(s): POST PARTUM TUBAL LIGATION (Bilateral)  Patient Location: Mother/Baby  Anesthesia Type:Epidural  Level of Consciousness: awake, alert  and oriented  Airway and Oxygen Therapy: Patient Spontanous Breathing  Post-op Pain: none  Post-op Assessment: Post-op Vital signs reviewed and Patient's Cardiovascular Status Stable  Post-op Vital Signs: Reviewed and stable  Last Vitals:  Filed Vitals:   05/08/14 0605  BP: 111/64  Pulse: 73  Temp: 36.7 C  Resp: 16    Complications: No apparent anesthesia complications

## 2014-05-08 NOTE — Addendum Note (Signed)
Addendum  created 05/08/14 [id number] by Janeece Ageeynthia W Kellen Dutch, CRNA   Modules edited: Notes Section   Notes Section:  File: [id number]

## 2014-05-08 NOTE — Plan of Care (Signed)
Problem: Discharge Progression Outcomes Goal: Barriers To Progression Addressed/Resolved Outcome: Completed/Met Date Met:  05/08/14 Goal: Activity appropriate for discharge plan Outcome: Completed/Met Date Met:  29/51/88 Goal: Complications resolved/controlled Outcome: Completed/Met Date Met:  05/08/14 Goal: Pain controlled with appropriate interventions Outcome: Completed/Met Date Met:  05/08/14 Goal: Discharge plan in place and appropriate Outcome: Completed/Met Date Met:  05/08/14 Goal: Other Discharge Outcomes/Goals Outcome: Completed/Met Date Met:  05/08/14

## 2014-05-08 NOTE — Op Note (Signed)
[name], Tonya [name]               ACCOUNT NO.:  [id number]  MEDICAL RECORD NO.:  [id number]  LOCATION:  9115                          FACILITY:  [facility]  PHYSICIAN:  Zelphia CairoGretchen Kellsey Sansone, MD    DATE OF BIRTH:  11-07-1985  DATE OF PROCEDURE:  05/07/2014 DATE OF DISCHARGE:                              OPERATIVE REPORT   PREOPERATIVE DIAGNOSES: 1. Multiparity. 2. Desires permanent sterilization.  PROCEDURE:  Postpartum bilateral tubal ligation with Filshie clips.  SURGEON:  Zelphia CairoGretchen Aino Heckert, MD  ANESTHESIA:  Epidural.  COMPLICATIONS:  None.  SPECIMEN:  None.  CONDITION:  Stable to recovery room.  PROCEDURE IN DETAIL:  After informed consent, the patient was taken to the operating room, where she was placed in the supine position.  Her epidural was dosed appropriately.  She was prepped and draped in sterile fashion and a Foley catheter was inserted.  A time-out was performed and 0.25% Marcaine was used to provide local anesthesia.  An infraumbilical skin incision was made with a scalpel and extended bluntly to the level of the fascia using a Kelly clamp.  The fascia was grasped with Kochers and entered sharply with a scalpel.  Peritoneum was then grasped and entered sharply with Metzenbaum scissors.  [employer] retractors were placed in the peritoneal cavity.  The left fallopian tube was identified first and a Filshie clip was placed around the entire circumference of the fallopian tube.  It was followed out to the fimbriated end.  This tube was placed back into the abdomen.  The right fallopian tube was then identified and followed out to the fimbriated end.  The Filshie clip was applied around the full circumference of the tube.  The patient tolerated the procedure with some discomfort which was controlled by anesthesia.  The fascia was closed with Vicryl.  The skin was closed with Vicryl and Dermabond was placed over the incision.  Sponge, lap, needle and instrument counts were  correct x2.     Zelphia CairoGretchen Clide Remmers, MD    GA/MEDQ  D:  05/07/2014  T:  05/08/2014  Job:  [id number]

## 2014-05-08 NOTE — Plan of Care (Signed)
Problem: Consults Goal: Postpartum Patient Education (See Patient Education module for education specifics.)  Outcome: Completed/Met Date Met:  05/08/14

## 2014-05-09 MED ORDER — OXYCODONE-ACETAMINOPHEN 5-325 MG PO TABS: 1.0000 | ORAL_TABLET | ORAL | Status: DC | PRN

## 2014-05-09 NOTE — Lactation Note (Signed)
This note was copied from the chart of Tonya Dallie DadHeather Gagliano. Lactation Consultation Note  Mother's breast are filling and starting to get engorged. Provided mother with ice packs and reviewed engorgement care. Mother denies other problems or questions.  States breastfeeding going well.  Patient Name: Tonya [name] ZOXWR'UToday's Date: 05/09/2014 Reason for consult: Follow-up assessment   Maternal Data    Feeding Feeding Type: Breast Fed Length of feed: 45 min  LATCH Score/Interventions                      Lactation Tools Discussed/Used     Consult Status Consult Status: Complete    Hardie PulleyBerkelhammer, Ruth Boschen 05/09/2014, 10:14 AM

## 2014-05-09 NOTE — Discharge Summary (Signed)
Obstetric Discharge Summary Reason for Admission: induction of labor Prenatal Procedures: ultrasound Intrapartum Procedures: spontaneous vaginal delivery Postpartum Procedures: P.P. tubal ligation Complications-Operative and Postpartum: 2 degree perineal laceration HEMOGLOBIN  Date Value Ref Range Status  05/08/2014 13.1 12.0 - 15.0 g/dL Final   HCT  Date Value Ref Range Status  05/08/2014 39.7 36.0 - 46.0 % Final    Physical Exam:  General: alert and cooperative Lochia: appropriate Uterine Fundus: firm Incision: healing well, abd inc. CDI DVT Evaluation: No evidence of DVT seen on physical exam. Negative Homan's sign.  Discharge Diagnoses: Term Pregnancy-delivered and postpartum tubal ligation  Discharge Information: Date: 05/09/2014 Activity: pelvic rest Diet: routine Medications: PNV and Percocet Condition: stable Instructions: refer to practice specific booklet Discharge to: home   Newborn Data: Live born female  Birth Weight: 7 lb 13.6 oz (3561 g) APGAR: 8, 9  Home with mother.  Tonya [name] G 05/09/2014, 8:19 AM

## 2015-02-06 ENCOUNTER — Ambulatory Visit (INDEPENDENT_AMBULATORY_CARE_PROVIDER_SITE_OTHER): Payer: BLUE CROSS/BLUE SHIELD | Admitting: Family Medicine

## 2015-02-06 ENCOUNTER — Encounter: Payer: Self-pay | Admitting: Family Medicine

## 2015-02-06 VITALS — BP 130/80 | HR 102 | Temp 98.4°F | Wt 215.0 lb

## 2015-02-06 DIAGNOSIS — B9789 Other viral agents as the cause of diseases classified elsewhere: Principal | ICD-10-CM

## 2015-02-06 DIAGNOSIS — R195 Other fecal abnormalities: Secondary | ICD-10-CM

## 2015-02-06 DIAGNOSIS — J069 Acute upper respiratory infection, unspecified: Secondary | ICD-10-CM

## 2015-02-06 DIAGNOSIS — F4321 Adjustment disorder with depressed mood: Secondary | ICD-10-CM | POA: Diagnosis not present

## 2015-02-06 MED ORDER — DICYCLOMINE HCL 20 MG PO TABS: 20.0000 mg | ORAL_TABLET | Freq: Three times a day (TID) | ORAL | Status: DC | PRN

## 2015-02-06 NOTE — Progress Notes (Signed)
Subjective:    Patient ID: Tonya [name], female    DOB: Mar 13, 1986, 29 y.o.   MRN: [id number]  HPI Patient seen for several issues as follows  Onset 2 days ago of bilateral ear pain, nasal congestion, cough. Clear nasal discharge. No fever. No body aches. Cough is relatively mild. Nonsmoker.  Patient relates she's had a couple weeks of intermittent loose stools and abdominal cramps. No recent antibiotics. No bloody stools. Appetite is fair. No recent travels.  She's had several issues issue with family stressors and couple of losses. Depressed mood. No suicidal ideation. Sleeping fairly well. No prior history of depression.  Past Medical History  Diagnosis Date  . Hypertension     no meds  . Gestational diabetes     (H/O)   oral meds  . Headache(784.0)   . Frequent UTI   . PONV (postoperative nausea and vomiting)   . Dizziness   . Hand tingling     And feet  . Cystitis     Occas  . Rapid heart rate    Past Surgical History  Procedure Laterality Date  . Spine surgery  03/06/11    lumbar disc L5  . Shoulder surgery  2008, 2009    L shoulder, acromion shave and recurrent dislocation.  . Breast lumpectomy Left     Benign  . Back surgery  2012  . Tubal ligation Bilateral 05/07/2014    Procedure: POST PARTUM TUBAL LIGATION;  Surgeon: Zelphia Cairo, MD;  Location: [facility];  Service: Gynecology;  Laterality: Bilateral;    reports that she has never smoked. She has never used smokeless tobacco. She reports that she does not drink alcohol or use illicit drugs. family history includes Diabetes in her maternal grandfather and paternal uncle; Hyperlipidemia in her paternal grandfather; Hypertension in her maternal grandfather, maternal grandmother, paternal grandfather, and paternal grandmother; Obesity in her other; Sleep apnea in her other; Stroke in her maternal grandfather; Thrombophlebitis in her paternal grandfather. Allergies  Allergen Reactions  . Green Dyes Other (See  Comments)    REACTION: hives  . Ibuprofen Other (See Comments)    esophagitis      Review of Systems  Constitutional: Positive for fatigue. Negative for fever and chills.  HENT: Positive for congestion and ear pain. Negative for sore throat.   Respiratory: Positive for cough.   Cardiovascular: Negative for chest pain.  Gastrointestinal: Positive for diarrhea. Negative for nausea and vomiting.  Neurological: Negative for dizziness.  Psychiatric/Behavioral: Positive for dysphoric mood. Negative for suicidal ideas.       Objective:   Physical Exam  Constitutional: She appears well-developed and well-nourished.  HENT:  Mouth/Throat: Oropharynx is clear and moist.  Neck: Neck supple. No thyromegaly present.  Cardiovascular: Normal rate and regular rhythm.   Pulmonary/Chest: Effort normal and breath sounds normal. No respiratory distress. She has no wheezes. She has no rales.  Musculoskeletal: She exhibits no edema.  Lymphadenopathy:    She has no cervical adenopathy.  Psychiatric: She has a normal mood and affect. Her behavior is normal. Judgment and thought content normal.          Assessment & Plan:  #1 viral URI. Nonfocal exam. Reassurance. Treat symptomatically #2 abdominal cramps and loose stools. Question viral. Nonfocal exam. Dicyclomine 20 mg every 8 hours as needed for abdominal cramps. Touch base if symptoms not resolving over the next week #3 depressed mood. PH Q-9 score of 11. We recommended counseling. Brochure given. Consider trial antidepressives if not  improved with counseling

## 2015-02-06 NOTE — Patient Instructions (Signed)

## 2015-02-06 NOTE — Progress Notes (Signed)
Pre visit review using our clinic review tool, if applicable. No additional management support is needed unless otherwise documented below in the visit note. 

## 2015-04-16 ENCOUNTER — Encounter: Payer: Self-pay | Admitting: Family Medicine

## 2015-04-23 DIAGNOSIS — M25532 Pain in left wrist: Secondary | ICD-10-CM | POA: Insufficient documentation

## 2015-05-17 ENCOUNTER — Ambulatory Visit (INDEPENDENT_AMBULATORY_CARE_PROVIDER_SITE_OTHER): Payer: BLUE CROSS/BLUE SHIELD | Admitting: Family Medicine

## 2015-05-17 ENCOUNTER — Encounter: Payer: Self-pay | Admitting: Family Medicine

## 2015-05-17 VITALS — BP 132/91 | HR 88 | Temp 98.4°F | Ht 66.0 in | Wt 218.0 lb

## 2015-05-17 DIAGNOSIS — L02229 Furuncle of trunk, unspecified: Secondary | ICD-10-CM

## 2015-05-17 MED ORDER — DOXYCYCLINE HYCLATE 100 MG PO CAPS: 100.0000 mg | ORAL_CAPSULE | Freq: Two times a day (BID) | ORAL | Status: AC

## 2015-05-17 NOTE — Progress Notes (Signed)
Pre visit review using our clinic review tool, if applicable. No additional management support is needed unless otherwise documented below in the visit note. 

## 2015-05-17 NOTE — Progress Notes (Signed)
   Subjective:    Patient ID: Tonya [name], female    DOB: 1985/12/23, 29 y.o.   MRN: [id number]  HPI Here for 2 days of a painful lump under the left breast. No fever.    Review of Systems  Constitutional: Negative.   Skin: Positive for color change.       Objective:   Physical Exam  Constitutional: She appears well-developed and well-nourished.  Skin:  There is a small tender boil under the left breast, the axilla is clear           Assessment & Plan:  Boil, treat with Doxycycline. Use warm compresses.

## 2015-08-05 ENCOUNTER — Ambulatory Visit (INDEPENDENT_AMBULATORY_CARE_PROVIDER_SITE_OTHER): Payer: PRIVATE HEALTH INSURANCE | Admitting: Family Medicine

## 2015-08-05 ENCOUNTER — Encounter: Payer: Self-pay | Admitting: Family Medicine

## 2015-08-05 VITALS — BP 134/88 | HR 97 | Temp 98.9°F | Wt 211.0 lb

## 2015-08-05 DIAGNOSIS — J329 Chronic sinusitis, unspecified: Secondary | ICD-10-CM

## 2015-08-05 MED ORDER — FLUCONAZOLE 150 MG PO TABS: 150.0000 mg | ORAL_TABLET | Freq: Once | ORAL | Status: DC

## 2015-08-05 MED ORDER — HYDROCODONE-HOMATROPINE 5-1.5 MG/5ML PO SYRP: 5.0000 mL | ORAL_SOLUTION | Freq: Four times a day (QID) | ORAL | Status: AC | PRN

## 2015-08-05 MED ORDER — CEFDINIR 300 MG PO CAPS: 300.0000 mg | ORAL_CAPSULE | Freq: Two times a day (BID) | ORAL | Status: DC

## 2015-08-05 NOTE — Progress Notes (Signed)
   Subjective:    Patient ID: Tonya [name], female    DOB: 31-Jul-1985, 30 y.o.   MRN: [id number]  HPI  acute visit.  Patient states since January of this year she's had stuffy nose along with some facial pain and intermittent headaches. Around end of January she went to a "Tele-Doc" through her work and was prescribed amoxicillin. She did not feel any better afterwards  she describes pansinusitis symptoms..   She's had some cough which is productive along with intermittent sore throat and greenish nasal discharge. She's tried over-the-counter medications without improvement.  Cough is becoming more bothersome. Not relieved with Delsym  Past Medical History  Diagnosis Date  . Hypertension     no meds  . Gestational diabetes     (H/O)   oral meds  . Headache(784.0)   . Frequent UTI   . PONV (postoperative nausea and vomiting)   . Dizziness   . Hand tingling     And feet  . Cystitis     Occas  . Rapid heart rate    Past Surgical History  Procedure Laterality Date  . Spine surgery  03/06/11    lumbar disc L5  . Shoulder surgery  2008, 2009    L shoulder, acromion shave and recurrent dislocation.  . Breast lumpectomy Left     Benign  . Back surgery  2012  . Tubal ligation Bilateral 05/07/2014    Procedure: POST PARTUM TUBAL LIGATION;  Surgeon: Zelphia Cairo, MD;  Location: [facility];  Service: Gynecology;  Laterality: Bilateral;    reports that she has never smoked. She has never used smokeless tobacco. She reports that she does not drink alcohol or use illicit drugs. family history includes Diabetes in her maternal grandfather and paternal uncle; Hyperlipidemia in her paternal grandfather; Hypertension in her maternal grandfather, maternal grandmother, paternal grandfather, and paternal grandmother; Obesity in her other; Sleep apnea in her other; Stroke in her maternal grandfather; Thrombophlebitis in her paternal grandfather. Allergies  Allergen Reactions  . Green Dyes Other  (See Comments)    REACTION: hives  . Ibuprofen Other (See Comments)    esophagitis      Review of Systems  Constitutional: Positive for fatigue. Negative for fever and chills.  HENT: Positive for congestion, sinus pressure and sore throat.   Respiratory: Positive for cough.   Neurological: Positive for headaches.       Objective:   Physical Exam  Constitutional: She appears well-developed and well-nourished.  HENT:  Right Ear: External ear normal.  Left Ear: External ear normal.  Mouth/Throat: Oropharynx is clear and moist.  Neck: Neck supple.  Cardiovascular: Normal rate and regular rhythm.   Pulmonary/Chest: Effort normal and breath sounds normal. No respiratory distress. She has no wheezes. She has no rales.  Lymphadenopathy:    She has no cervical adenopathy.          Assessment & Plan:   patient presents with several week history of progressive sinus symptoms. Consider pansinusitis. Omnicef 300 mg twice daily for 10 days. Stay well-hydrated. Limited Hycodan cough syrup 1 teaspoon daily at bedtime for severe cough. Consider  limited CT sinuses if not better in 2 weeks

## 2015-08-05 NOTE — Patient Instructions (Signed)

## 2015-08-12 ENCOUNTER — Telehealth: Payer: Self-pay | Admitting: Family Medicine

## 2015-08-12 NOTE — Telephone Encounter (Signed)
Pt is aware and will try sudafed.

## 2015-08-12 NOTE — Telephone Encounter (Signed)
Pt was seen on 08-05-15 for sinus infection. Pt still can not breath out of her noses. Please advise

## 2015-08-12 NOTE — Telephone Encounter (Signed)
Try Flonase and (if she can take) sudafed.

## 2015-08-12 NOTE — Telephone Encounter (Signed)
Pt should currently be on her 7th day of ceftinir. Recommend a possible nasal spray?

## 2015-08-26 ENCOUNTER — Emergency Department (HOSPITAL_COMMUNITY): Payer: PRIVATE HEALTH INSURANCE

## 2015-08-26 ENCOUNTER — Encounter (HOSPITAL_COMMUNITY): Payer: Self-pay | Admitting: Emergency Medicine

## 2015-08-26 ENCOUNTER — Emergency Department (HOSPITAL_COMMUNITY)
Admission: EM | Admit: 2015-08-26 | Discharge: 2015-08-26 | Disposition: A | Discharge status: Home / Self Care | Payer: PRIVATE HEALTH INSURANCE | Attending: Emergency Medicine | Admitting: Emergency Medicine

## 2015-08-26 DIAGNOSIS — Y999 Unspecified external cause status: Secondary | ICD-10-CM | POA: Insufficient documentation

## 2015-08-26 DIAGNOSIS — Y939 Activity, unspecified: Secondary | ICD-10-CM | POA: Diagnosis not present

## 2015-08-26 DIAGNOSIS — X58XXXA Exposure to other specified factors, initial encounter: Secondary | ICD-10-CM | POA: Diagnosis not present

## 2015-08-26 DIAGNOSIS — S93402A Sprain of unspecified ligament of left ankle, initial encounter: Secondary | ICD-10-CM | POA: Diagnosis not present

## 2015-08-26 DIAGNOSIS — Y929 Unspecified place or not applicable: Secondary | ICD-10-CM | POA: Insufficient documentation

## 2015-08-26 DIAGNOSIS — S99912A Unspecified injury of left ankle, initial encounter: Secondary | ICD-10-CM | POA: Diagnosis present

## 2015-08-26 DIAGNOSIS — I10 Essential (primary) hypertension: Secondary | ICD-10-CM | POA: Diagnosis not present

## 2015-08-26 NOTE — Discharge Instructions (Signed)
Your x-ray is negative for fracture or dislocation. Please apply ice. Please use the ankle splint for the next 10 days. You do not have to sleep in the splints. Use Tylenol every 4 hours, or ibuprofen every 6 hours for soreness. Ankle Sprain An ankle sprain is an injury to the strong, fibrous tissues (ligaments) that hold your ankle bones together.  HOME CARE   Put ice on your ankle for 1-2 days or as told by your doctor.  Put ice in a plastic bag.  Place a towel between your skin and the bag.  Leave the ice on for 15-20 minutes at a time, every 2 hours while you are awake.  Only take medicine as told by your doctor.  Raise (elevate) your injured ankle above the level of your heart as much as possible for 2-3 days.  Use crutches if your doctor tells you to. Slowly put your own weight on the affected ankle. Use the crutches until you can walk without pain.  If you have a plaster splint:  Do not rest it on anything harder than a pillow for 24 hours.  Do not put weight on it.  Do not get it wet.  Take it off to shower or bathe.  If given, use an elastic wrap or support stocking for support. Take the wrap off if your toes lose feeling (numb), tingle, or turn cold or blue.  If you have an air splint:  Add or let out air to make it comfortable.  Take it off at night and to shower and bathe.  Wiggle your toes and move your ankle up and down often while you are wearing it. GET HELP IF:  You have rapidly increasing bruising or puffiness (swelling).  Your toes feel very cold.  You lose feeling in your foot.  Your medicine does not help your pain. GET HELP RIGHT AWAY IF:   Your toes lose feeling (numb) or turn blue.  You have severe pain that is increasing. MAKE SURE YOU:   Understand these instructions.  Will watch your condition.  Will get help right away if you are not doing well or get worse.   This information is not intended to replace advice given to you by  your health care provider. Make sure you discuss any questions you have with your health care provider.   Document Released: 11/11/2007 Document Revised: 06/15/2014 Document Reviewed: 12/07/2011 Elsevier Interactive Patient Education [employer]2016 [employer]

## 2015-08-26 NOTE — ED Notes (Signed)
Pt c/o LT ankle pain since Saturday. Edema noted to ankle. Pt ambulatory.

## 2015-08-26 NOTE — ED Provider Notes (Signed)
CSN: [id number]     Arrival date & time 08/26/15  1828 History  By signing my name below, I, Tonya [name], attest that this documentation has been prepared under the direction and in the presence of Ivery QualeHobson Minela Bridge[place], PA-C. Electronically Signed: Lyndel SafeKaitlyn [name], ED Scribe. 08/26/2015. 8:19 PM.   Chief Complaint  Patient presents with  . Ankle Pain   Patient is a 30 y.o. female presenting with ankle pain. The history is provided by the patient. No language interpreter was used.  Ankle Pain Location:  Ankle Time since incident:  2 days Injury: yes   Mechanism of injury comment:  Inversion Ankle location:  L ankle Pain details:    Radiates to:  Does not radiate   Severity:  Moderate   Onset quality:  Sudden   Duration:  2 days   Timing:  Constant   Progression:  Unchanged Chronicity:  New Dislocation: no   Foreign body present:  No foreign bodies Relieved by:  None tried Worsened by:  Bearing weight Ineffective treatments:  None tried Associated symptoms: swelling   Associated symptoms: no decreased ROM, no muscle weakness, no numbness and no tingling    HPI Comments: Tonya [name] is a 30 y.o. female who presents to the Emergency Department complaining of sudden onset, constant, moderate left ankle pain onset 2 days ago s/p inversion injury that occurred 2 days ago. She associates delayed onset edema to left ankle. She has been ambulatory without difficulty following the inversion injury.   Past Medical History  Diagnosis Date  . Hypertension     no meds  . Gestational diabetes     (H/O)   oral meds  . Headache(784.0)   . Frequent UTI   . PONV (postoperative nausea and vomiting)   . Dizziness   . Hand tingling     And feet  . Cystitis     Occas  . Rapid heart rate    Past Surgical History  Procedure Laterality Date  . Spine surgery  03/06/11    lumbar disc L5  . Shoulder surgery  2008, 2009    L shoulder, acromion shave and recurrent dislocation.  . Breast  lumpectomy Left     Benign  . Back surgery  2012  . Tubal ligation Bilateral 05/07/2014    Procedure: POST PARTUM TUBAL LIGATION;  Surgeon: Zelphia CairoGretchen Adkins, MD;  Location: [facility];  Service: Gynecology;  Laterality: Bilateral;   Family History  Problem Relation Age of Onset  . Diabetes Paternal Uncle   . Hypertension Paternal Grandmother   . Obesity Other   . Sleep apnea Other   . Hypertension Maternal Grandmother   . Hypertension Maternal Grandfather   . Diabetes Maternal Grandfather   . Stroke Maternal Grandfather   . Hypertension Paternal Grandfather   . Thrombophlebitis Paternal Grandfather   . Hyperlipidemia Paternal Grandfather    Social History  Substance Use Topics  . Smoking status: Never Smoker   . Smokeless tobacco: Never Used  . Alcohol Use: No   OB History    Gravida Para Term Preterm AB TAB SAB Ectopic Multiple Living   2 2 2  0 0 0 0 0 0 2     Review of Systems  Musculoskeletal: Positive for joint swelling ( left ankle) and arthralgias ( left ankle).  Skin: Negative for color change and wound.  All other systems reviewed and are negative.  Allergies  Green dyes and Ibuprofen  Home Medications   Prior to Admission medications  Medication Sig Start Date End Date Taking? Authorizing Provider  cefdinir (OMNICEF) 300 MG capsule Take 1 capsule (300 mg total) by mouth 2 (two) times daily. 08/05/15   Kristian Covey, MD  dicyclomine (BENTYL) 20 MG tablet Take 1 tablet (20 mg total) by mouth every 8 (eight) hours as needed for spasms. Patient not taking: Reported on 05/17/2015 02/06/15   Kristian Covey, MD  fluconazole (DIFLUCAN) 150 MG tablet Take 1 tablet (150 mg total) by mouth once. 08/05/15   Kristian Covey, MD   BP 142/82 mmHg  Pulse 83  Temp(Src) 98.3 F (36.8 C) (Oral)  Resp 18  Ht  (1.676 m)  Wt 199 lb (90.266 kg)  BMI 32.13 kg/m2  SpO2 100%  LMP 08/03/2015 Physical Exam  Constitutional: She is oriented to person, place, and time. She  appears well-developed and well-nourished. No distress.  HENT:  Head: Normocephalic.  Eyes: Conjunctivae are normal.  Neck: Normal range of motion. Neck supple.  Cardiovascular: Normal rate.   Pulmonary/Chest: Effort normal. No respiratory distress.  Musculoskeletal: Normal range of motion.  Left lower extremity: DP and TP pulses 2+, capillary refill less than 2 seconds, no deformity of metatarsal heads, achilles tendon intact, pain to palpation of the lateral malleolus, no evidence of effusion of the ankle, FROM of left knee and left hip.    Neurological: She is alert and oriented to person, place, and time. Coordination normal.  Skin: Skin is warm.  Psychiatric: She has a normal mood and affect. Her behavior is normal.  Nursing note and vitals reviewed.   ED Course  Procedures  DIAGNOSTIC STUDIES: Oxygen Saturation is 100% on RA, normal by my interpretation.    COORDINATION OF CARE: 8:17 PM Discussed treatment plan with pt at bedside which includes ankle splint and pt agreed to plan.  Imaging Review Dg Ankle Complete Left  08/26/2015  CLINICAL DATA:  History of rolling injury to the left ankle complaining of lateral pain for the past 2 days. EXAM: LEFT ANKLE COMPLETE - 3+ VIEW COMPARISON:  None. FINDINGS: There is no evidence of fracture, dislocation, or joint effusion. There is no evidence of arthropathy or other focal bone abnormality. Soft tissues are unremarkable. IMPRESSION: Negative. Electronically Signed   By: Trudie Reed M.D.   On: 08/26/2015 19:03   I have personally reviewed and evaluated these images results as part of my medical decision-making.   MDM  Vital signs stable. Xray of the left ankle is negative for fracture or dislocation. Pt to be fitted with aso splint. Ice pack and elevation to be used. Pt to follow up with orthopedics or return to the ED if any changes or problem.   Final diagnoses:  None    **I have reviewed nursing notes, vital signs, and all  appropriate lab and imaging results for this patient.*  **I personally performed the services described in this documentation, which was scribed in my presence. The recorded information has been reviewed and is accurate.Ivery Quale, PA-C 09/01/15 2105  Glynn Octave, MD 09/02/15 1159

## 2015-09-09 ENCOUNTER — Encounter: Payer: Self-pay | Admitting: Internal Medicine

## 2017-02-23 ENCOUNTER — Encounter (HOSPITAL_BASED_OUTPATIENT_CLINIC_OR_DEPARTMENT_OTHER): Payer: Self-pay | Admitting: *Deleted

## 2017-03-03 NOTE — H&P (Addendum)
Tonya [name] is an 31 y.o. female presents for definitive mngt of menorrhagia.  No Improvement of sx with mirena IUD, declines ablation.  Menstrual History: No LMP recorded.    Past Medical History:  Diagnosis Date  . Cystitis    Occas  . Dizziness   . Frequent UTI   . Gestational diabetes    (H/O)   oral meds  . Hand tingling    And feet  . Headache(784.0)   . Hypertension    no meds  . PONV (postoperative nausea and vomiting)   . Rapid heart rate     Past Surgical History:  Procedure Laterality Date  . BACK SURGERY  2012  . BREAST LUMPECTOMY Left    Benign  . SHOULDER SURGERY  2008, 2009   L shoulder, acromion shave and recurrent dislocation.  Marland Kitchen SPINE SURGERY  03/06/11   lumbar disc L5  . TUBAL LIGATION Bilateral 05/07/2014   Procedure: POST PARTUM TUBAL LIGATION;  Surgeon: Zelphia Cairo, MD;  Location: [facility];  Service: Gynecology;  Laterality: Bilateral;    Family History  Problem Relation Age of Onset  . Diabetes Paternal Uncle   . Hypertension Paternal Grandmother   . Obesity Other   . Sleep apnea Other   . Hypertension Maternal Grandmother   . Hypertension Maternal Grandfather   . Diabetes Maternal Grandfather   . Stroke Maternal Grandfather   . Hypertension Paternal Grandfather   . Thrombophlebitis Paternal Grandfather   . Hyperlipidemia Paternal Grandfather     Social History:  reports that she has never smoked. She has never used smokeless tobacco. She reports that she does not drink alcohol or use drugs.  Allergies:  Allergies  Allergen Reactions  . Green Dyes Other (See Comments)    REACTION: hives  . Ibuprofen Other (See Comments)    esophagitis    No prescriptions prior to admission.    ROS  AF, VSS Physical Exam  Gen - NAD CV - RRR Lungs - clear Abd - soft, NT/ND Ext - NT, no edema PV - uterus mobile, NT, no edema  PV [place]:  No uterine or adnexal masses or free fluid  Assessment/Plan:  Menorrhagia LAVH/BS R/b/a  discussed, questions answered, informed consent  Tonya [name] 03/03/2017, 1:36 PM

## 2017-03-05 NOTE — Patient Instructions (Addendum)
Tonya [name]  03/05/2017      Your procedure is scheduled on 03-18-17  Report to [facility] [place]  at  530 A.M.  Call this number if you have problems the morning of [phone]  OUR ADDRESS IS [address], WE ARE LOCATED [facility] WITH [facility].   Remember:  Do not eat food or drink liquids after midnight.  Take these medicines the morning of surgery with A SIP OF WATER: tylenol as needed   Do not wear jewelry, make-up or nail polish.  Do not wear lotions, powders, or perfumes, or deoderant.  Do not shave 48 hours prior to surgery.  Men may shave face and neck.  Do not bring valuables to the hospital.  [facility] is not responsible for any belongings or valuables.  Contacts, dentures or bridgework may not be worn into surgery.    Leave your suitcase in the car.  After surgery it may be brought to your room.  Patients discharged the day of surgery will not be allowed to drive home.   Special instructions:  None   Please read over the following fact sheets that you were given:     [facility] - Preparing for Surgery Before surgery, you can play an important role.  Because skin is not sterile, your skin needs to be as free of germs as possible.  You can reduce the number of germs on your skin by washing with CHG (chlorahexidine gluconate) soap before surgery.  CHG is an antiseptic cleaner which kills germs and bonds with the skin to continue killing germs even after washing. Please DO NOT use if you have an allergy to CHG or antibacterial soaps.  If your skin becomes reddened/irritated stop using the CHG and inform your nurse when you arrive at Short Stay. Do not shave (including legs and underarms) for at least 48 hours prior to the first CHG shower.  You may shave your face/neck. Please follow these instructions carefully:  1.  Shower with CHG Soap the night before surgery and the  morning of Surgery.  2.  If you choose to  wash your hair, wash your hair first as usual with your  normal  shampoo.  3.  After you shampoo, rinse your hair and body thoroughly to remove the  shampoo.                           4.  Use CHG as you would any other liquid soap.  You can apply chg directly  to the skin and wash                       Gently with a scrungie or clean washcloth.  5.  Apply the CHG Soap to your body ONLY FROM THE NECK DOWN.   Do not use on face/ open                           Wound or open sores. Avoid contact with eyes, ears mouth and genitals (private parts).                       Wash face,  Genitals (private parts) with your normal soap.             6.  Wash thoroughly, paying special attention to the area where your  surgery  will be performed.  7.  Thoroughly rinse your body with warm water from the neck down.  8.  DO NOT shower/wash with your normal soap after using and rinsing off  the CHG Soap.                9.  Pat yourself dry with a clean towel.            10.  Wear clean pajamas.            11.  Place clean sheets on your bed the night of your first shower and do not  sleep with pets. Day of Surgery : Do not apply any lotions/deodorants the morning of surgery.  Please wear clean clothes to the hospital/surgery center.  FAILURE TO FOLLOW THESE INSTRUCTIONS MAY RESULT IN THE CANCELLATION OF YOUR SURGERY PATIENT SIGNATURE_________________________________  NURSE SIGNATURE__________________________________  ________________________________________________________________________

## 2017-03-09 ENCOUNTER — Encounter (HOSPITAL_COMMUNITY): Payer: Self-pay

## 2017-03-09 ENCOUNTER — Encounter (HOSPITAL_COMMUNITY)
Admission: RE | Admit: 2017-03-09 | Discharge: 2017-03-09 | Disposition: A | Discharge status: Home / Self Care | Payer: PRIVATE HEALTH INSURANCE | Source: Ambulatory Visit | Attending: Obstetrics and Gynecology | Admitting: Obstetrics and Gynecology

## 2017-03-09 ENCOUNTER — Encounter (INDEPENDENT_AMBULATORY_CARE_PROVIDER_SITE_OTHER): Payer: Self-pay

## 2017-03-09 DIAGNOSIS — Z01810 Encounter for preprocedural cardiovascular examination: Secondary | ICD-10-CM | POA: Insufficient documentation

## 2017-03-09 DIAGNOSIS — Z01812 Encounter for preprocedural laboratory examination: Secondary | ICD-10-CM | POA: Insufficient documentation

## 2017-03-09 DIAGNOSIS — I10 Essential (primary) hypertension: Secondary | ICD-10-CM | POA: Diagnosis not present

## 2017-03-09 DIAGNOSIS — N920 Excessive and frequent menstruation with regular cycle: Secondary | ICD-10-CM | POA: Insufficient documentation

## 2017-03-09 LAB — CBC
HCT: 44.2 % (ref 36.0–46.0)
Hemoglobin: 14.7 g/dL (ref 12.0–15.0)
MCH: 29.4 pg (ref 26.0–34.0)
MCHC: 33.3 g/dL (ref 30.0–36.0)
MCV: 88.4 fL (ref 78.0–100.0)
Platelets: 281 10*3/uL (ref 150–400)
RBC: 5 MIL/uL (ref 3.87–5.11)
RDW: 12.6 % (ref 11.5–15.5)
WBC: 7.9 10*3/uL (ref 4.0–10.5)

## 2017-03-09 LAB — BASIC METABOLIC PANEL
ANION GAP: 7 (ref 5–15)
BUN: 12 mg/dL (ref 6–20)
CO2: 27 mmol/L (ref 22–32)
Calcium: 9.2 mg/dL (ref 8.9–10.3)
Chloride: 106 mmol/L (ref 101–111)
Creatinine, Ser: 0.63 mg/dL (ref 0.44–1.00)
Glucose, Bld: 98 mg/dL (ref 65–99)
POTASSIUM: 4.9 mmol/L (ref 3.5–5.1)
SODIUM: 140 mmol/L (ref 135–145)

## 2017-03-09 LAB — ABO/RH: ABO/RH(D): O POS

## 2017-03-09 LAB — HCG, SERUM, QUALITATIVE: Preg, Serum: NEGATIVE

## 2017-03-18 ENCOUNTER — Encounter (HOSPITAL_BASED_OUTPATIENT_CLINIC_OR_DEPARTMENT_OTHER): Payer: Self-pay

## 2017-03-18 ENCOUNTER — Ambulatory Visit (HOSPITAL_BASED_OUTPATIENT_CLINIC_OR_DEPARTMENT_OTHER): Payer: PRIVATE HEALTH INSURANCE | Admitting: Anesthesiology

## 2017-03-18 ENCOUNTER — Encounter (HOSPITAL_BASED_OUTPATIENT_CLINIC_OR_DEPARTMENT_OTHER)
Admission: RE | Disposition: A | Discharge status: Home / Self Care | Payer: Self-pay | Source: Ambulatory Visit | Attending: Obstetrics and Gynecology

## 2017-03-18 ENCOUNTER — Observation Stay (HOSPITAL_BASED_OUTPATIENT_CLINIC_OR_DEPARTMENT_OTHER)
Admission: RE | Admit: 2017-03-18 | Discharge: 2017-03-19 | Disposition: A | Discharge status: Home / Self Care | Payer: PRIVATE HEALTH INSURANCE | Source: Ambulatory Visit | Attending: Obstetrics and Gynecology | Admitting: Obstetrics and Gynecology

## 2017-03-18 DIAGNOSIS — N946 Dysmenorrhea, unspecified: Secondary | ICD-10-CM | POA: Diagnosis not present

## 2017-03-18 DIAGNOSIS — I10 Essential (primary) hypertension: Secondary | ICD-10-CM | POA: Insufficient documentation

## 2017-03-18 DIAGNOSIS — E119 Type 2 diabetes mellitus without complications: Secondary | ICD-10-CM | POA: Diagnosis not present

## 2017-03-18 DIAGNOSIS — N72 Inflammatory disease of cervix uteri: Secondary | ICD-10-CM | POA: Diagnosis not present

## 2017-03-18 DIAGNOSIS — N920 Excessive and frequent menstruation with regular cycle: Secondary | ICD-10-CM | POA: Diagnosis not present

## 2017-03-18 DIAGNOSIS — R51 Headache: Secondary | ICD-10-CM | POA: Insufficient documentation

## 2017-03-18 DIAGNOSIS — Z87891 Personal history of nicotine dependence: Secondary | ICD-10-CM | POA: Insufficient documentation

## 2017-03-18 DIAGNOSIS — Z9851 Tubal ligation status: Secondary | ICD-10-CM | POA: Diagnosis not present

## 2017-03-18 HISTORY — PX: LAPAROSCOPIC VAGINAL HYSTERECTOMY WITH SALPINGECTOMY: SHX6680

## 2017-03-18 LAB — TYPE AND SCREEN
ABO/RH(D): O POS
ANTIBODY SCREEN: NEGATIVE

## 2017-03-18 SURGERY — HYSTERECTOMY, VAGINAL, LAPAROSCOPY-ASSISTED, WITH SALPINGECTOMY
Anesthesia: General | Site: Vagina | Laterality: Bilateral

## 2017-03-18 MED ORDER — DEXTROSE 5 % IV SOLN
2.0000 g | INTRAVENOUS | Status: AC
  Administered 2017-03-18: 2 g via INTRAVENOUS
  Filled 2017-03-18: qty 2

## 2017-03-18 MED ORDER — SCOPOLAMINE 1 MG/3DAYS TD PT72
MEDICATED_PATCH | TRANSDERMAL | Status: AC
  Filled 2017-03-18: qty 1

## 2017-03-18 MED ORDER — ONDANSETRON HCL 4 MG/2ML IJ SOLN
INTRAMUSCULAR | Status: DC | PRN
  Administered 2017-03-18: 4 mg via INTRAVENOUS

## 2017-03-18 MED ORDER — LACTATED RINGERS IV SOLN
INTRAVENOUS | Status: DC
  Administered 2017-03-18: 11:00:00 via INTRAVENOUS
  Filled 2017-03-18 (×2): qty 1000

## 2017-03-18 MED ORDER — METOCLOPRAMIDE HCL 5 MG/ML IJ SOLN
INTRAMUSCULAR | Status: DC | PRN
  Administered 2017-03-18: 5 mg via INTRAVENOUS

## 2017-03-18 MED ORDER — OXYCODONE-ACETAMINOPHEN 5-325 MG PO TABS
ORAL_TABLET | ORAL | Status: AC
  Filled 2017-03-18: qty 1

## 2017-03-18 MED ORDER — LACTATED RINGERS IV SOLN
INTRAVENOUS | Status: DC
  Filled 2017-03-18: qty 1000

## 2017-03-18 MED ORDER — MIDAZOLAM HCL 2 MG/2ML IJ SOLN
INTRAMUSCULAR | Status: DC | PRN
  Administered 2017-03-18: 2 mg via INTRAVENOUS

## 2017-03-18 MED ORDER — LIDOCAINE 2% (20 MG/ML) 5 ML SYRINGE
INTRAMUSCULAR | Status: AC
  Filled 2017-03-18: qty 10

## 2017-03-18 MED ORDER — OXYCODONE-ACETAMINOPHEN 5-325 MG PO TABS
1.0000 | ORAL_TABLET | ORAL | Status: DC | PRN
  Administered 2017-03-18 (×2): 1 via ORAL
  Administered 2017-03-18: 2 via ORAL
  Administered 2017-03-18 – 2017-03-19 (×2): 1 via ORAL
  Filled 2017-03-18: qty 1
  Filled 2017-03-18: qty 2

## 2017-03-18 MED ORDER — DEXAMETHASONE SODIUM PHOSPHATE 10 MG/ML IJ SOLN
INTRAMUSCULAR | Status: DC | PRN
  Administered 2017-03-18: 10 mg via INTRAVENOUS

## 2017-03-18 MED ORDER — KETOROLAC TROMETHAMINE 30 MG/ML IJ SOLN
INTRAMUSCULAR | Status: AC
  Filled 2017-03-18: qty 1

## 2017-03-18 MED ORDER — HYDROMORPHONE HCL-NACL 0.5-0.9 MG/ML-% IV SOSY
PREFILLED_SYRINGE | INTRAVENOUS | Status: AC
  Filled 2017-03-18: qty 1

## 2017-03-18 MED ORDER — MENTHOL 3 MG MT LOZG
1.0000 | LOZENGE | OROMUCOSAL | Status: DC | PRN
  Filled 2017-03-18: qty 9

## 2017-03-18 MED ORDER — ACETAMINOPHEN 10 MG/ML IV SOLN
INTRAVENOUS | Status: DC | PRN
  Administered 2017-03-18: 1000 mg via INTRAVENOUS

## 2017-03-18 MED ORDER — DEXAMETHASONE SODIUM PHOSPHATE 10 MG/ML IJ SOLN
INTRAMUSCULAR | Status: AC
  Filled 2017-03-18: qty 1

## 2017-03-18 MED ORDER — FAMOTIDINE 20 MG PO TABS
20.0000 mg | ORAL_TABLET | Freq: Two times a day (BID) | ORAL | Status: DC
  Administered 2017-03-18 – 2017-03-19 (×3): 20 mg via ORAL
  Filled 2017-03-18 (×2): qty 1

## 2017-03-18 MED ORDER — ONDANSETRON HCL 4 MG/2ML IJ SOLN
INTRAMUSCULAR | Status: AC
  Filled 2017-03-18: qty 2

## 2017-03-18 MED ORDER — FENTANYL CITRATE (PF) 100 MCG/2ML IJ SOLN
INTRAMUSCULAR | Status: AC
  Filled 2017-03-18: qty 2

## 2017-03-18 MED ORDER — ONDANSETRON HCL 4 MG/2ML IJ SOLN
4.0000 mg | Freq: Four times a day (QID) | INTRAMUSCULAR | Status: DC | PRN
  Filled 2017-03-18: qty 2

## 2017-03-18 MED ORDER — FENTANYL CITRATE (PF) 100 MCG/2ML IJ SOLN
INTRAMUSCULAR | Status: DC | PRN
  Administered 2017-03-18 (×6): 50 ug via INTRAVENOUS

## 2017-03-18 MED ORDER — ROCURONIUM BROMIDE 10 MG/ML (PF) SYRINGE
PREFILLED_SYRINGE | INTRAVENOUS | Status: DC | PRN
Start: 2017-03-18 — End: 2017-03-18
  Administered 2017-03-18: 50 mg via INTRAVENOUS

## 2017-03-18 MED ORDER — ONDANSETRON HCL 4 MG/2ML IJ SOLN
4.0000 mg | Freq: Once | INTRAMUSCULAR | Status: DC | PRN
  Filled 2017-03-18: qty 2

## 2017-03-18 MED ORDER — SUGAMMADEX SODIUM 200 MG/2ML IV SOLN
INTRAVENOUS | Status: AC
Start: 2017-03-18 — End: 2017-03-18
  Filled 2017-03-18: qty 2

## 2017-03-18 MED ORDER — SCOPOLAMINE 1 MG/3DAYS TD PT72
MEDICATED_PATCH | TRANSDERMAL | Status: DC | PRN
  Administered 2017-03-18: 1 via TRANSDERMAL

## 2017-03-18 MED ORDER — MIDAZOLAM HCL 2 MG/2ML IJ SOLN
INTRAMUSCULAR | Status: AC
  Filled 2017-03-18: qty 2

## 2017-03-18 MED ORDER — ALBUTEROL SULFATE HFA 108 (90 BASE) MCG/ACT IN AERS
2.0000 | INHALATION_SPRAY | Freq: Four times a day (QID) | RESPIRATORY_TRACT | Status: DC | PRN
Start: 2017-03-18 — End: 2017-03-19
  Filled 2017-03-18: qty 6.7

## 2017-03-18 MED ORDER — ARTIFICIAL TEARS OPHTHALMIC OINT
TOPICAL_OINTMENT | OPHTHALMIC | Status: AC
  Filled 2017-03-18: qty 3.5

## 2017-03-18 MED ORDER — ACETAMINOPHEN 10 MG/ML IV SOLN
INTRAVENOUS | Status: AC
  Filled 2017-03-18: qty 100

## 2017-03-18 MED ORDER — FAMOTIDINE 20 MG PO TABS
ORAL_TABLET | ORAL | Status: AC
  Filled 2017-03-18: qty 1

## 2017-03-18 MED ORDER — CEFOTETAN DISODIUM-DEXTROSE 2-2.08 GM-% IV SOLR
INTRAVENOUS | Status: AC
Start: 2017-03-18 — End: 2017-03-18
  Filled 2017-03-18: qty 50

## 2017-03-18 MED ORDER — LIDOCAINE 2% (20 MG/ML) 5 ML SYRINGE
INTRAMUSCULAR | Status: AC
  Filled 2017-03-18: qty 5

## 2017-03-18 MED ORDER — LACTATED RINGERS IV SOLN
INTRAVENOUS | Status: DC
  Administered 2017-03-18 (×2): via INTRAVENOUS
  Filled 2017-03-18: qty 1000

## 2017-03-18 MED ORDER — METOCLOPRAMIDE HCL 5 MG/ML IJ SOLN
INTRAMUSCULAR | Status: AC
  Filled 2017-03-18: qty 2

## 2017-03-18 MED ORDER — ONDANSETRON HCL 4 MG PO TABS
4.0000 mg | ORAL_TABLET | Freq: Four times a day (QID) | ORAL | Status: DC | PRN
  Filled 2017-03-18: qty 1

## 2017-03-18 MED ORDER — ROCURONIUM BROMIDE 50 MG/5ML IV SOSY
PREFILLED_SYRINGE | INTRAVENOUS | Status: AC
  Filled 2017-03-18: qty 5

## 2017-03-18 MED ORDER — OXYCODONE HCL 5 MG PO TABS
5.0000 mg | ORAL_TABLET | Freq: Once | ORAL | Status: DC | PRN
  Filled 2017-03-18: qty 1

## 2017-03-18 MED ORDER — PROPOFOL 10 MG/ML IV BOLUS
INTRAVENOUS | Status: AC
  Filled 2017-03-18: qty 40

## 2017-03-18 MED ORDER — KETOROLAC TROMETHAMINE 30 MG/ML IJ SOLN
30.0000 mg | Freq: Three times a day (TID) | INTRAMUSCULAR | Status: AC | PRN
  Administered 2017-03-18 – 2017-03-19 (×3): 30 mg via INTRAVENOUS
  Filled 2017-03-18: qty 1

## 2017-03-18 MED ORDER — OXYCODONE HCL 5 MG/5ML PO SOLN
5.0000 mg | Freq: Once | ORAL | Status: DC | PRN
  Filled 2017-03-18: qty 5

## 2017-03-18 MED ORDER — OXYCODONE-ACETAMINOPHEN 5-325 MG PO TABS
ORAL_TABLET | ORAL | Status: AC
  Filled 2017-03-18: qty 2

## 2017-03-18 MED ORDER — FENTANYL CITRATE (PF) 100 MCG/2ML IJ SOLN
25.0000 ug | INTRAMUSCULAR | Status: DC | PRN
  Administered 2017-03-18 (×3): 50 ug via INTRAVENOUS
  Filled 2017-03-18: qty 1

## 2017-03-18 MED ORDER — PROPOFOL 10 MG/ML IV BOLUS
INTRAVENOUS | Status: DC | PRN
  Administered 2017-03-18: 200 mg via INTRAVENOUS

## 2017-03-18 MED ORDER — SUGAMMADEX SODIUM 200 MG/2ML IV SOLN
INTRAVENOUS | Status: DC | PRN
  Administered 2017-03-18: 200 mg via INTRAVENOUS

## 2017-03-18 MED ORDER — BUPIVACAINE HCL (PF) 0.25 % IJ SOLN
INTRAMUSCULAR | Status: DC | PRN
  Administered 2017-03-18: 2 mL

## 2017-03-18 MED ORDER — LIDOCAINE 2% (20 MG/ML) 5 ML SYRINGE
INTRAMUSCULAR | Status: DC | PRN
  Administered 2017-03-18: 100 mg via INTRAVENOUS

## 2017-03-18 MED ORDER — HYDROMORPHONE HCL 1 MG/ML IJ SOLN
1.0000 mg | INTRAMUSCULAR | Status: DC | PRN
  Administered 2017-03-18 (×2): 0.5 mg via INTRAVENOUS
  Filled 2017-03-18: qty 1

## 2017-03-18 SURGICAL SUPPLY — 62 items
ADH SKN CLS APL DERMABOND .7 (GAUZE/BANDAGES/DRESSINGS) ×1
BAG SPEC RTRVL LRG 6X4 10 (ENDOMECHANICALS)
BARRIER ADHS 3X4 INTERCEED (GAUZE/BANDAGES/DRESSINGS) IMPLANT
BLADE CLIPPER SURG (BLADE) IMPLANT
BRR ADH 4X3 ABS CNTRL BYND (GAUZE/BANDAGES/DRESSINGS)
CANISTER SUCT 3000ML PPV (MISCELLANEOUS) IMPLANT
CLOSURE WOUND 1/4X4 (GAUZE/BANDAGES/DRESSINGS)
CLOTH BEACON ORANGE TIMEOUT ST (SAFETY) ×3 IMPLANT
COVER BACK TABLE 60X90IN (DRAPES) ×4 IMPLANT
DECANTER SPIKE VIAL GLASS SM (MISCELLANEOUS) ×3 IMPLANT
DERMABOND ADVANCED (GAUZE/BANDAGES/DRESSINGS) ×2
DERMABOND ADVANCED .7 DNX12 (GAUZE/BANDAGES/DRESSINGS) ×1 IMPLANT
DRSG OPSITE POSTOP 3X4 (GAUZE/BANDAGES/DRESSINGS) ×3 IMPLANT
DRSG TEGADERM 2-3/8X2-3/4 SM (GAUZE/BANDAGES/DRESSINGS) IMPLANT
DURAPREP 26ML APPLICATOR (WOUND CARE) ×3 IMPLANT
ELECT LIGASURE LONG (ELECTRODE) IMPLANT
ELECT LIGASURE SHORT 9 REUSE (ELECTRODE) IMPLANT
ELECT REM PT RETURN 9FT ADLT (ELECTROSURGICAL) ×3
ELECTRODE REM PT RTRN 9FT ADLT (ELECTROSURGICAL) ×1 IMPLANT
FILTER SMOKE EVAC LAPAROSHD (FILTER) IMPLANT
GLOVE BIO SURGEON STRL SZ 6.5 (GLOVE) ×6 IMPLANT
GLOVE BIO SURGEON STRL SZ7.5 (GLOVE) ×4 IMPLANT
GLOVE BIO SURGEONS STRL SZ 6.5 (GLOVE) ×3
GLOVE BIOGEL PI IND STRL 7.0 (GLOVE) ×1 IMPLANT
GLOVE BIOGEL PI INDICATOR 7.0 (GLOVE) ×2
GLOVE ECLIPSE 6.5 STRL STRAW (GLOVE) ×3 IMPLANT
GOWN STRL REUS W/TWL XL LVL3 (GOWN DISPOSABLE) ×2 IMPLANT
HOLDER FOLEY CATH W/STRAP (MISCELLANEOUS) ×3 IMPLANT
KIT RM TURNOVER CYSTO AR (KITS) ×3 IMPLANT
LEGGING LITHOTOMY PAIR STRL (DRAPES) ×3 IMPLANT
NS IRRIG 500ML POUR BTL (IV SOLUTION) ×3 IMPLANT
PACK LAVH (CUSTOM PROCEDURE TRAY) ×3 IMPLANT
PACK ROBOTIC GOWN (GOWN DISPOSABLE) ×3 IMPLANT
PACK TRENDGUARD 450 HYBRID PRO (MISCELLANEOUS) IMPLANT
PACK TRENDGUARD 600 HYBRD PROC (MISCELLANEOUS) IMPLANT
PAD OB MATERNITY 4.3X12.25 (PERSONAL CARE ITEMS) ×3 IMPLANT
PAD PREP 24X48 CUFFED NSTRL (MISCELLANEOUS) ×3 IMPLANT
POUCH SPECIMEN RETRIEVAL 10MM (ENDOMECHANICALS) IMPLANT
SCISSORS LAP 5X35 DISP (ENDOMECHANICALS) IMPLANT
SEALER TISSUE G2 CVD JAW 45CM (ENDOMECHANICALS) ×3 IMPLANT
SET IRRIG TUBING LAPAROSCOPIC (IRRIGATION / IRRIGATOR) ×3 IMPLANT
STRIP CLOSURE SKIN 1/4X4 (GAUZE/BANDAGES/DRESSINGS) IMPLANT
SUT MNCRL 0 MO-4 VIOLET 18 CR (SUTURE) ×1 IMPLANT
SUT MON AB 2-0 CT1 36 (SUTURE) ×3 IMPLANT
SUT MONOCRYL 0 MO 4 18  CR/8 (SUTURE) ×4
SUT VIC AB 3-0 PS2 18 (SUTURE) ×3
SUT VIC AB 3-0 PS2 18XBRD (SUTURE) ×1 IMPLANT
SUT VIC AB 3-0 SH 27 (SUTURE)
SUT VIC AB 3-0 SH 27X BRD (SUTURE) IMPLANT
SUT VICRYL 0 TIES 12 18 (SUTURE) ×3 IMPLANT
SUT VICRYL 0 UR6 27IN ABS (SUTURE) ×3 IMPLANT
SYR 3ML 23GX1 SAFETY (SYRINGE) IMPLANT
SYR BULB IRRIGATION 50ML (SYRINGE) ×3 IMPLANT
TOWEL OR 17X24 6PK STRL BLUE (TOWEL DISPOSABLE) ×6 IMPLANT
TRAY FOLEY CATH SILVER 14FR (SET/KITS/TRAYS/PACK) ×3 IMPLANT
TRENDGUARD 450 HYBRID PRO PACK (MISCELLANEOUS)
TRENDGUARD 600 HYBRID PROC PK (MISCELLANEOUS)
TROCAR OPTI TIP 5M 100M (ENDOMECHANICALS) ×3 IMPLANT
TROCAR XCEL NON-BLD 11X100MML (ENDOMECHANICALS) ×3 IMPLANT
TUBING INSUF HEATED (TUBING) ×3 IMPLANT
WARMER LAPAROSCOPE (MISCELLANEOUS) ×3 IMPLANT
WATER STERILE IRR 500ML POUR (IV SOLUTION) ×3 IMPLANT

## 2017-03-18 NOTE — Anesthesia Preprocedure Evaluation (Addendum)
Anesthesia Evaluation  Patient identified by MRN, date of birth, ID band Patient awake    Reviewed: Allergy & Precautions, NPO status , Patient's Chart, lab work & pertinent test results  History of Anesthesia Complications (+) PONV  Airway Mallampati: II  TM Distance: >3 FB Neck ROM: Full    Dental  (+) Teeth Intact, Dental Advisory Given   Pulmonary former smoker,    breath sounds clear to auscultation       Cardiovascular hypertension,  Rhythm:Regular Rate:Normal     Neuro/Psych    GI/Hepatic   Endo/Other  diabetes  Renal/GU      Musculoskeletal   Abdominal   Peds  Hematology   Anesthesia Other Findings   Reproductive/Obstetrics                            Anesthesia Physical Anesthesia Plan  ASA: II  Anesthesia Plan: General   Post-op Pain Management:    Induction: Intravenous  PONV Risk Score and Plan: Ondansetron, Dexamethasone and Scopolamine patch - Pre-op  Airway Management Planned: Oral ETT  Additional Equipment:   Intra-op Plan:   Post-operative Plan: Extubation in OR  Informed Consent: I have reviewed the patients History and Physical, chart, labs and discussed the procedure including the risks, benefits and alternatives for the proposed anesthesia with the patient or authorized representative who has indicated his/her understanding and acceptance.   Dental advisory given  Plan Discussed with: CRNA and Anesthesiologist  Anesthesia Plan Comments:         Anesthesia Quick Evaluation

## 2017-03-18 NOTE — Progress Notes (Signed)
Day of Surgery Procedure(s) (LRB): LAPAROSCOPIC ASSISTED VAGINAL HYSTERECTOMY WITH SALPINGECTOMY (Bilateral)  Subjective: Patient reports incisional pain and tolerating PO.  Pt reports she can take ibuprofen if she takes a heartburn medication prior.  Not a true allergy - just gets some GI upset.  Objective: I have reviewed patient's vital signs, intake and output and medications.  Gen - NAD Abd - soft, NT Ext - NT  Assessment: s/p Procedure(s) with comments: LAPAROSCOPIC ASSISTED VAGINAL HYSTERECTOMY WITH SALPINGECTOMY (Bilateral) - NEEDS BED: stable and progressing well  Plan: Advance diet Encourage ambulation Advance to PO medication  LOS: 0 days    Tonya [name] 03/18/2017, 1:50 PM

## 2017-03-18 NOTE — Transfer of Care (Signed)
  Last Vitals:  Vitals:   03/18/17 0545  BP: (!) 119/92  Pulse: 75  Resp: 16  Temp: 37.2 C  SpO2: 100%    Last Pain:  Vitals:   03/18/17 0545  TempSrc: Oral      Patients Stated Pain Goal: 5 (03/18/17 0545)  Immediate Anesthesia Transfer of Care Note  Patient: Tonya [name]  Procedure(s) Performed: Procedure(s) (LRB): LAPAROSCOPIC ASSISTED VAGINAL HYSTERECTOMY WITH SALPINGECTOMY (Bilateral)  Patient Location: PACU  Anesthesia Type: General  Level of Consciousness: awake, alert  and oriented  Airway & Oxygen Therapy: Patient Spontanous Breathing and Patient connected to nasal cannula oxygen  Post-op Assessment: Report given to PACU RN and Post -op Vital signs reviewed and stable  Post vital signs: Reviewed and stable  Complications: No apparent anesthesia complications

## 2017-03-18 NOTE — Anesthesia Procedure Notes (Signed)
Procedure Name: Intubation Date/Time: 03/18/2017 7:28 AM Performed by: Linna Caprice, DAVID Pre-anesthesia Checklist: Patient identified, Emergency Drugs available, Suction available and Patient being monitored Patient Re-evaluated:Patient Re-evaluated prior to induction Oxygen Delivery Method: Circle system utilized Preoxygenation: Pre-oxygenation with 100% oxygen Induction Type: IV induction Ventilation: Mask ventilation without difficulty Laryngoscope Size: Mac and 3 Grade View: Grade I Tube type: Oral Tube size: 7.0 mm Number of attempts: 1 Airway Equipment and Method: Stylet and LTA kit utilized Placement Confirmation: ETT inserted through vocal cords under direct vision,  positive ETCO2 and breath sounds checked- equal and bilateral Secured at: 21 cm Tube secured with: Tape Dental Injury: Teeth and Oropharynx as per pre-operative assessment

## 2017-03-18 NOTE — Anesthesia Postprocedure Evaluation (Signed)
Anesthesia Post Note  Patient: SYRETTA KOCHEL  Procedure(s) Performed: LAPAROSCOPIC ASSISTED VAGINAL HYSTERECTOMY WITH SALPINGECTOMY (Bilateral Vagina )     Patient location during evaluation: PACU Anesthesia Type: General Level of consciousness: awake, awake and alert and oriented Pain management: pain level controlled Vital Signs Assessment: post-procedure vital signs reviewed and stable Respiratory status: spontaneous breathing, nonlabored ventilation and respiratory function stable Cardiovascular status: blood pressure returned to baseline Anesthetic complications: no    Last Vitals:  Vitals:   03/18/17 1030 03/18/17 1045  BP: 121/81 129/81  Pulse: 70 61  Resp: 13 12  Temp:    SpO2: 96% 95%    Last Pain:  Vitals:   03/18/17 1015  TempSrc:   PainSc: 5                  Joel Cowin COKER

## 2017-03-18 NOTE — OR Nursing (Signed)
Ambulating independently. Tolerating po's well and voiding qs.

## 2017-03-18 NOTE — Op Note (Signed)
03/18/2017  9:14 AM  PATIENT:  Tonya [name]  31 y.o. female  PRE-OPERATIVE DIAGNOSIS:  MENORRHAGIA, DYSMENORRHEA  POST-OPERATIVE DIAGNOSIS:  MENORRHAGIA, DYSMENORRHEA  PROCEDURE:  Procedure(s) with comments: LAPAROSCOPIC ASSISTED VAGINAL HYSTERECTOMY WITH SALPINGECTOMY (Bilateral) - NEEDS BED  SURGEON:  Surgeon(s) and Role:    * Zelphia Cairo, MD - Primary    * Harold Hedge, MD - Assisting  PHYSICIAN ASSISTANT: jim tomblin, MD  ANESTHESIA:   general  EBL:  Total I/O In: 1300 [I.V.:1300] Out: 400 [Urine:100; Blood:300]  BLOOD ADMINISTERED:none  DRAINS: none   LOCAL MEDICATIONS USED:  MARCAINE     SPECIMEN:  Source of Specimen:  uterus and bilateral fallopian tubes  DISPOSITION OF SPECIMEN:  PATHOLOGY  COUNTS:  YES  TOURNIQUET:  * No tourniquets in log *  DICTATION: .Other Dictation: Dictation Number pending  PLAN OF CARE: Admit for overnight observation  PATIENT DISPOSITION:  PACU - hemodynamically stable.   Delay start of Pharmacological VTE agent (>24hrs) due to surgical blood loss or risk of bleeding: no

## 2017-03-19 DIAGNOSIS — N920 Excessive and frequent menstruation with regular cycle: Secondary | ICD-10-CM | POA: Diagnosis not present

## 2017-03-19 LAB — CBC
HEMATOCRIT: 36.7 % (ref 36.0–46.0)
Hemoglobin: 12.2 g/dL (ref 12.0–15.0)
MCH: 29.8 pg (ref 26.0–34.0)
MCHC: 33.2 g/dL (ref 30.0–36.0)
MCV: 89.5 fL (ref 78.0–100.0)
PLATELETS: 247 10*3/uL (ref 150–400)
RBC: 4.1 MIL/uL (ref 3.87–5.11)
RDW: 12.3 % (ref 11.5–15.5)
WBC: 19.7 10*3/uL — AB (ref 4.0–10.5)

## 2017-03-19 MED ORDER — FAMOTIDINE 20 MG PO TABS: 20.0000 mg | ORAL_TABLET | Freq: Two times a day (BID) | ORAL | 2 refills | Status: DC

## 2017-03-19 MED ORDER — KETOROLAC TROMETHAMINE 30 MG/ML IJ SOLN
INTRAMUSCULAR | Status: AC
  Filled 2017-03-19: qty 1

## 2017-03-19 MED ORDER — FAMOTIDINE 20 MG PO TABS
ORAL_TABLET | ORAL | Status: AC
  Filled 2017-03-19: qty 1

## 2017-03-19 MED ORDER — OXYCODONE-ACETAMINOPHEN 5-325 MG PO TABS: 1.0000 | ORAL_TABLET | ORAL | 0 refills | Status: DC | PRN

## 2017-03-19 MED ORDER — OXYCODONE-ACETAMINOPHEN 5-325 MG PO TABS
ORAL_TABLET | ORAL | Status: AC
  Filled 2017-03-19: qty 2

## 2017-03-19 MED ORDER — SODIUM CHLORIDE 0.9 % IJ SOLN
INTRAMUSCULAR | Status: AC
  Filled 2017-03-19: qty 10

## 2017-03-19 MED ORDER — IBUPROFEN 600 MG PO TABS: 600.0000 mg | ORAL_TABLET | Freq: Four times a day (QID) | ORAL | 0 refills | Status: DC | PRN

## 2017-03-19 NOTE — Discharge Summary (Signed)
Physician Discharge Summary  Patient ID: HIYAB NHEM MRN: [id number] DOB/AGE: Jul 17, 1985 31 y.o.  Admit date: 03/18/2017 Discharge date: 03/19/2017  Admission Diagnoses: menorrhagia  Discharge Diagnoses:  Active Problems:   Menorrhagia   Discharged Condition: stable  Hospital Course: Pt was admitted overnight for postoperative care.  Initially, her pain was controlled with IV pain medications.  Once she was able to tolerate PO, her diet was advanced and her pain was controlled with PO medications.  On POD1, she was able to void, ambulate and tolerate regular diet.  Vitals and labs were stable.  Consults: None  Significant Diagnostic Studies: labs: cbc  Treatments: surgery: LAVH/BS  Discharge Exam: Blood pressure 115/80, pulse 62, temperature 98.5 F (36.9 C), resp. rate 16, height  (1.676 m), weight 189 lb (85.7 kg), last menstrual period 03/02/2017, SpO2 100 %. General appearance: alert and cooperative GI: normal findings: soft, non-tender  Incision:  C/d/i  Disposition: 01-Home or Self Care  Discharge Instructions    Diet - low sodium heart healthy    Complete by:  As directed    Increase activity slowly    Complete by:  As directed      Allergies as of 03/19/2017      Reactions   Green Dyes Other (See Comments)   REACTION: hives   Ibuprofen Other (See Comments)   Esophagitis and chest pain       Medication List    STOP taking these medications   acetaminophen 325 MG tablet Commonly known as:  TYLENOL   cefdinir 300 MG capsule Commonly known as:  OMNICEF   dicyclomine 20 MG tablet Commonly known as:  BENTYL   fluconazole 150 MG tablet Commonly known as:  DIFLUCAN   levonorgestrel 20 MCG/24HR IUD Commonly known as:  MIRENA     TAKE these medications   albuterol 108 (90 Base) MCG/ACT inhaler Commonly known as:  PROVENTIL HFA;VENTOLIN HFA Inhale 2 puffs into the lungs every 6 (six) hours as needed for wheezing or shortness of breath.    famotidine 20 MG tablet Commonly known as:  PEPCID Take 1 tablet (20 mg total) by mouth 2 (two) times daily.   ibuprofen 600 MG tablet Commonly known as:  ADVIL Take 1 tablet (600 mg total) by mouth every 6 (six) hours as needed.   oxyCODONE-acetaminophen 5-325 MG tablet Commonly known as:  PERCOCET/ROXICET Take 1-2 tablets by mouth every 4 (four) hours as needed for severe pain (moderate to severe pain (when tolerating fluids)).      Follow-up Information    Zelphia Cairo, MD. Schedule an appointment as soon as possible for a visit in 2 week(s).   Specialty:  Obstetrics and Gynecology Contact information: [address] [place], [address] [place] [address] [phone]           Signed: Zelphia Cairo 03/19/2017, 8:13 AM

## 2017-03-19 NOTE — Discharge Instructions (Signed)
No driving for 2 weeks or while taking pain medicine.  No heavy lifting for 4 weeks 10 lb limit.  Nothing in vagina for 8 weeks.

## 2017-03-19 NOTE — Op Note (Signed)
NAME:  Tonya [name], Tonya [name]                    ACCOUNT NO.:  MEDICAL RECORD NO.:  [id number]  LOCATION:                                 FACILITY:  PHYSICIAN:  Zelphia Cairo, MD         DATE OF BIRTH:  DATE OF PROCEDURE:  03/18/2017 DATE OF DISCHARGE:                              OPERATIVE REPORT   PREOPERATIVE DIAGNOSIS:  Menorrhagia.  POSTOPERATIVE DIAGNOSIS:  Menorrhagia.  PROCEDURE:  Laparoscopic-assisted vaginal hysterectomy with bilateral salpingectomy.  SURGEON:  Zelphia Cairo, MD.  ASSISTANT:  Laurence Compton.  ESTIMATED BLOOD LOSS:  300 mL.  ANESTHESIA:  General.  COMPLICATIONS:  None.  CONDITION:  Stable to recovery room.  DESCRIPTION OF PROCEDURE:  The patient was taken to the operating room after informed consent was obtained.  She was given general anesthesia and placed in the dorsal lithotomy position using Allen stirrups.  Her left arm was tucked.  She was prepped and draped in sterile fashion and a Foley catheter was inserted.  Bivalve speculum was placed in the vagina and a single-tooth tenaculum on the anterior lip of the cervix. A Hulka clamp was placed.  Tenaculum and speculum were removed and our attention was turned to the abdomen.  Marcaine 0.25% was used to provide local anesthesia to the sites of her skin incision.  An infraumbilical skin incision was made with a scalpel and extended bluntly to the level of the fascia using a Kelly clamp.  Optical trocar was then inserted under direct visualization.  Once intraperitoneal placement was confirmed, CO2 was turned on and a survey was performed.  Filshie clips were noted on bilateral fallopian tubes, and ovaries appeared normal, uterus appeared normal.  Suprapubic incision was made and a 5 mm trocar inserted under direct visualization.  The uterus was manipulated to the patient's left and the right fallopian tube was grasped with an atraumatic grasper, tented upwards and toward the midline.  Mesosalpinx was  dissected using the EnSeal device.  This incision was extended down the broad ligament.  The utero-ovarian ligament was grasped, cauterized, and cut using the EnSeal.  The round ligament was grasped, cauterized, and cut with the EnSeal.  Hemostasis was assured and this procedure was repeated on the opposite side.  Once hemostasis was noted, all instruments were removed and our attention was turned to the vagina.  The Hulka clamp was removed.  Retractors were placed and the cervix was grasped with a toothed tenaculum.  A circumferential incision was made around the cervix.  The posterior cul-de-sac was entered sharply using curved Mayo scissors.  A long weighted speculum was placed.  Bilateral uterosacral ligaments were grasped, cut, and suture ligated.  Anterior cul-de-sac was entered sharply and a Deaver was placed anteriorly. Cardinal ligament and uterine artery were clamped, cut, and suture ligated.  Hemostasis was noted.  The fundus of the uterus was then delivered through the posterior cul-de-sac and remaining pedicles were clamped with Heaney clamps bilaterally and cut using curved Mayo's.  The uterus and bilateral fallopian tubes were passed off to be sent to pathology.  The pedicles were then doubly tied using Monocryl.  The posterior vaginal cuff was  reapproximated using a running lock suture. The remainder of the vaginal cuff was closed using a series of figure-of- eight sutures of 0 Monocryl.  Hemostasis was noted.  All instruments were removed from the vagina and our attention was returned to the abdomen.  The abdomen was reinsufflated and a survey performed.  All pedicles were inspected and found to be hemostatic.  Instruments and trocars were removed from the abdomen.  A deep stitch was placed in the infraumbilical incision and both skin incisions were closed with Vicryl. Dermabond and bandages were placed.     Zelphia Cairo, MD     GA/MEDQ  D:  03/18/2017  T:   03/18/2017  Job:  [id number]

## 2017-03-22 ENCOUNTER — Encounter (HOSPITAL_BASED_OUTPATIENT_CLINIC_OR_DEPARTMENT_OTHER): Payer: Self-pay | Admitting: Obstetrics and Gynecology

## 2018-06-20 ENCOUNTER — Encounter (HOSPITAL_COMMUNITY): Payer: Self-pay | Admitting: Emergency Medicine

## 2018-06-20 ENCOUNTER — Ambulatory Visit (HOSPITAL_COMMUNITY)
Admission: EM | Admit: 2018-06-20 | Discharge: 2018-06-20 | Disposition: A | Discharge status: Home / Self Care | Payer: PRIVATE HEALTH INSURANCE

## 2018-06-20 ENCOUNTER — Other Ambulatory Visit: Payer: Self-pay

## 2018-06-20 DIAGNOSIS — T7840XA Allergy, unspecified, initial encounter: Secondary | ICD-10-CM

## 2018-06-20 DIAGNOSIS — R220 Localized swelling, mass and lump, head: Secondary | ICD-10-CM

## 2018-06-20 DIAGNOSIS — R110 Nausea: Secondary | ICD-10-CM

## 2018-06-20 MED ORDER — METHYLPREDNISOLONE SODIUM SUCC 125 MG IJ SOLR
INTRAMUSCULAR | Status: AC
Start: 2018-06-20 — End: ?
  Filled 2018-06-20: qty 2

## 2018-06-20 MED ORDER — METHYLPREDNISOLONE SODIUM SUCC 125 MG IJ SOLR
125.0000 mg | Freq: Once | INTRAMUSCULAR | Status: AC
  Administered 2018-06-20: 125 mg via INTRAMUSCULAR

## 2018-06-20 NOTE — Discharge Instructions (Addendum)
Take Zyrtec 10mg  once daily, Zantac 150mg  twice daily. If these don't help with allergic reaction, you can use benadryl 50mg  once every 6 hours.

## 2018-06-20 NOTE — ED Triage Notes (Signed)
Drank a milk based product this morning.  Throat started feeling swollen, lips and eyelids are swollen.  Patient has had 2 benadryl

## 2018-06-20 NOTE — ED Provider Notes (Signed)
MRN: [id number] DOB: 04-01-1986  Subjective:   Tonya [name] is a 33 y.o. female presenting for acute onset this morning of moderate-severe worsening constant malaise. Has had itching with milk products in the past. Had milk last night and this morning. Has not had any other new exposures. Smokes on occasion but is not a regular smoker.   Takes Celexa, uses albuterol inhaler prn for esophagitis.    Allergies  Allergen Reactions  . Green Dyes Other (See Comments)    REACTION: hives  . Ibuprofen Other (See Comments)    Esophagitis and chest pain   . Milk-Related Compounds     Past Medical History:  Diagnosis Date  . Cystitis    Occas  . Dizziness   . Frequent UTI   . Gestational diabetes    (H/O)   oral meds  . Hand tingling    And feet  . Headache(784.0)   . Hypertension    no meds  . PONV (postoperative nausea and vomiting)   . Rapid heart rate    during pregnancy      Past Surgical History:  Procedure Laterality Date  . ABDOMINAL HYSTERECTOMY    . BACK SURGERY  2012  . BREAST LUMPECTOMY Left    Benign  . LAPAROSCOPIC VAGINAL HYSTERECTOMY WITH SALPINGECTOMY Bilateral 03/18/2017   Procedure: LAPAROSCOPIC ASSISTED VAGINAL HYSTERECTOMY WITH SALPINGECTOMY;  Surgeon: Zelphia Cairo, MD;  Location: [facility];  Service: Gynecology;  Laterality: Bilateral;  NEEDS BED  . SHOULDER SURGERY  2008, 2009   L shoulder, acromion shave and recurrent dislocation.  Marland Kitchen SPINE SURGERY  03/06/11   lumbar disc L5  . TUBAL LIGATION Bilateral 05/07/2014   Procedure: POST PARTUM TUBAL LIGATION;  Surgeon: Zelphia Cairo, MD;  Location: [facility];  Service: Gynecology;  Laterality: Bilateral;  . VAGINAL DELIVERY  2014, 2015    Review of Systems  Constitutional: Positive for malaise/fatigue. Negative for chills and fever.  HENT: Positive for sore throat (throat fullness). Negative for congestion, ear discharge, ear pain and sinus pain.   Eyes: Positive for redness. Negative  for blurred vision and double vision.       Eyelid swelling.  Respiratory: Negative for cough, hemoptysis, sputum production, shortness of breath and wheezing.   Cardiovascular: Negative for chest pain and palpitations.  Gastrointestinal: Positive for nausea. Negative for abdominal pain and vomiting.  Genitourinary: Negative for dysuria and hematuria.  Musculoskeletal: Negative for myalgias.  Skin: Positive for itching. Negative for rash.  Neurological: Positive for tingling (of her tongue). Negative for dizziness and headaches.  Psychiatric/Behavioral: Negative for hallucinations and substance abuse.    Objective:   Vitals: BP 128/79 (BP Location: Right Arm)   Pulse 82   Temp 98.1 F (36.7 C) (Oral)   Resp 16   LMP 03/02/2017 (Approximate)   SpO2 98%   Physical Exam Constitutional:      General: She is not in acute distress.    Appearance: Normal appearance. She is well-developed. She is not ill-appearing, toxic-appearing or diaphoretic.  HENT:     Head: Normocephalic and atraumatic.     Right Ear: Tympanic membrane and ear canal normal. No drainage or tenderness. No middle ear effusion. Tympanic membrane is not erythematous.     Left Ear: Tympanic membrane and ear canal normal. No drainage or tenderness.  No middle ear effusion. Tympanic membrane is not erythematous.     Nose: Nose normal. No congestion or rhinorrhea.     Mouth/Throat:     Mouth: Mucous  membranes are moist. No oral lesions.     Pharynx: Oropharynx is clear. No pharyngeal swelling, oropharyngeal exudate, posterior oropharyngeal erythema or uvula swelling.     Tonsils: No tonsillar exudate or tonsillar abscesses.  Eyes:     General:        Right eye: No discharge.        Left eye: No discharge.     Extraocular Movements: Extraocular movements intact.     Right eye: Normal extraocular motion.     Left eye: Normal extraocular motion.     Conjunctiva/sclera: Conjunctivae normal.     Pupils: Pupils are equal,  round, and reactive to light.  Neck:     Musculoskeletal: Normal range of motion and neck supple.  Cardiovascular:     Rate and Rhythm: Normal rate and regular rhythm.     Pulses: Normal pulses.     Heart sounds: Normal heart sounds. No murmur. No friction rub. No gallop.   Pulmonary:     Effort: Pulmonary effort is normal. No respiratory distress.     Breath sounds: Normal breath sounds. No stridor. No wheezing, rhonchi or rales.  Abdominal:     General: Bowel sounds are normal. There is no distension.     Palpations: There is no mass.     Tenderness: There is no abdominal tenderness. There is no right CVA tenderness, left CVA tenderness, guarding or rebound.  Lymphadenopathy:     Cervical: No cervical adenopathy.  Skin:    General: Skin is warm and dry.     Findings: No rash.  Neurological:     General: No focal deficit present.     Mental Status: She is alert and oriented to person, place, and time.  Psychiatric:        Mood and Affect: Mood normal.        Behavior: Behavior normal.        Thought Content: Thought content normal.    Assessment and Plan :   Allergic reaction, initial encounter  Facial swelling  Nausea without vomiting  Patient is likely having an allergic reaction to milk related product.  I updated her allergies in her chart.  Given her stable vital signs and reassuring physical exam findings apart from bilateral eyelid swelling, will use IM Solu-Medrol; Zyrtec and Zantac at home.  Strict ER precautions given and reviewed signs and symptoms of anaphylactic reaction.  Patient plans to follow-up with her PCP for consideration for allergy testing, allergy specialist referral.    Wallis BambergMani, Tynasia Mccaul, PA-C 06/20/18 [phone]

## 2018-07-26 ENCOUNTER — Ambulatory Visit (INDEPENDENT_AMBULATORY_CARE_PROVIDER_SITE_OTHER): Payer: PRIVATE HEALTH INSURANCE | Admitting: Allergy & Immunology

## 2018-07-26 ENCOUNTER — Encounter: Payer: Self-pay | Admitting: Allergy & Immunology

## 2018-07-26 VITALS — BP 104/82 | HR 89 | Resp 16 | Ht 66.0 in | Wt 219.0 lb

## 2018-07-26 DIAGNOSIS — T783XXD Angioneurotic edema, subsequent encounter: Secondary | ICD-10-CM | POA: Diagnosis not present

## 2018-07-26 DIAGNOSIS — T50905D Adverse effect of unspecified drugs, medicaments and biological substances, subsequent encounter: Secondary | ICD-10-CM | POA: Diagnosis not present

## 2018-07-26 DIAGNOSIS — T7800XD Anaphylactic reaction due to unspecified food, subsequent encounter: Secondary | ICD-10-CM

## 2018-07-26 DIAGNOSIS — J4520 Mild intermittent asthma, uncomplicated: Secondary | ICD-10-CM

## 2018-07-26 MED ORDER — EPINEPHRINE 0.3 MG/0.3ML IJ SOAJ: 0.3000 mg | INTRAMUSCULAR | 2 refills | Status: DC | PRN

## 2018-07-26 NOTE — Progress Notes (Signed)
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 presents for an evaluation of 1 year of intermittent reactions to cow's milk-based products.  Testing today does confirm that she does have a milk allergy.  She was reactive to cows milk and slightly reactive to casein.  She has reacted to baked milk products in the past, and I recommended that she avoid all milk products for now.  We can certainly trend her levels as time goes on to see where she is headed.  But and the side of caution, I think avoidance of all milk products is the best for now.  We did provide training for an epinephrine autoinjector and an anaphylaxis management plan.  She does have these reactions to raw fruits, which I felt was more consistent with oral allergy syndrome.  However, she had absolutely no environmental sensitizations, including the pollens.  Therefore, I think this is more likely related to residual pesticide on the fruits or another chemical.  I recommended that she wash all of her foods thoroughly from now on.  She carries an albuterol inhaler, although it is unclear of the reasoning.  Her spirometry today is clearly normal.  I do not think she needs a controller medication at this time.  She tells me her albuterol was prescribed for her reactions to ibuprofen, but if her reactions ibuprofen were isolated only to the esophagus, I see no reason why an albuterol inhaler would help with this.  She does note that her reactions to ibuprofen are controlled if she pre-medicates with an H2 blocker, which points more towards an worsening of her reflux with exposure to the ibuprofen.  This is clearly a confusing picture, but I think it would be  prudent for her to come in for a challenge to another NSAID given her history of migraines.  We can give her a dose of Aleve and watch her for a couple of hours to make sure that she tolerates this fine.    Plan/Recommendations:   1. Anaphylactic shock due to food - Testing was positive to milk and slightly reactive to casein, therefore I think you should avoid all milk products. Tonya [name] demonstration provided. - They will call you in 1-2 days to confirm your shipping address. - Anaphylaxis management plan provided. - Testing was negative to the remainder of the foods we tested, including the fruits. - I am unsure of what to make of the mouth irritation from the fruits, but the testing today points against an anaphylactic reaction. - I thought that this was going to be oral allergy syndrome, but with the negative environmental testing, I do not think that this is the case.   2. Mild intermittent asthma, uncomplicated - Testing was normal today. - Continue with albuterol 2 puffs every 4-6 hours as needed for coughing/wheezing.   3. Adverse effect of drug (ibuprofen) - Your symptoms seem more consistent with reflux when you are exposed to the ibuprofen. - This seems to be the case due to the prevention of the reaction with Zantac. - I would consider bringing you in for a challenge to another NSAID - such as naproxen - so that we could get another option for future migraines. - In the  meantime, you can continue to use Tylenol as needed.  4. Return in about 3 months (around 10/24/2018).  Subjective:   Tonya [name] is a 33 y.o. female presenting today for evaluation of  Chief Complaint  Patient presents with  . Urticaria    protien shakes- break out   . Angioedema  . Food Intolerance    dairy    Tonya [name] has a history of the following: Patient Active Problem List   Diagnosis Date Noted  . Menorrhagia 03/18/2017  . Status post induction of labor 05/07/2014  . SVD  (spontaneous vaginal delivery) 05/07/2014  . Near syncope 01/15/2014  . Pregnancy 01/15/2014  . URI 04/01/2010  . COMMON MIGRAINE 12/17/2006  . HYPERTENSION 12/17/2006    History obtained from: chart review and patient.  Tonya [name] was referred by Lanelle Bal, PA-C.     Tonya [name] is a 33 y.o. female presenting for an evaluation of a possible food allergy.  She was seen in the ER in January 2020 approximately 1 month ago.  She got to work in downtown [place] and her coworkers sent her to the hospital. Her face was very itchy and she had swelling around her eyes. At that time, she presented with malaise and itching.  She does have a history of previous reactions with milk and she was exposed to milk prior to the onset of the symptoms.  She was given IM Solu-Medrol, cetirizine, and Zantac. She had a protein shake in the morning as well as a fresh apple that was milk based but she has been using the same protein shake for a long period time. She also had milk the evening prior to that. She reports that she was having throat swelling and itching.   She reports that she has an itchy throat with exposure to milk products. This has been ongoing for the past year or so. She always has tingling in her mouth but but had never been so severe. She has this with milk and cereal as well as yogurt.   She had a strawberry banana smoothie this morning with apple juice and she experienced the tingling in her mouth. She does have tingling with fresh bananas and apples and fruits including strawberry.     Asthma/Respiratory Symptom History: She does have an albuterol inhaler on hand.  She uses this for "esophagitis flares".  She does not remember the last time that she needed it.  It tends to only be a problem when she uses ibuprofen.  Allergic Rhinitis Symptom History: She does report that she gets sinus infections 1-2 times per year. She denies sneezing, itchy watery eyes, or runny nose.  Food Allergy  Symptom History: Aside from the reaction to milk and fruits, as mentioned above, she tolerates all of the other food allergens without adverse event.  She has no epinephrine autoinjector on hand.  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 She tends to get bronchitis or pneumonia routinely. She gets antibiotics around twice times per year.   She does have an albuterol inhaler when she has "esophagitis flares". She was diagnosed 8 years ago with this and her PCP prescribes her albuterol. She specially has flares when she is exposed to ibuprofen. She reports that she feels that she cannot swallow initially and then she has problems with getting air in. She does not have itching and hives with the ibuprofen. She now uses on Tylenol. She knows  that she can take Zantac prior to using ibuprofen and she can tolerate it fine. She does not use any Aleve or other pain killers.   She does not take anything daily for her reflux. Her symptoms have worsened since she had children. But she takes no regular PPI or H2 blocker.   Otherwise, there is no history of other atopic diseases, including stinging insect allergies, eczema, urticaria or contact dermatitis. There is no significant infectious history. Vaccinations are up to date.    Past Medical History: Patient Active Problem List   Diagnosis Date Noted  . Menorrhagia 03/18/2017  . Status post induction of labor 05/07/2014  . SVD (spontaneous vaginal delivery) 05/07/2014  . Near syncope 01/15/2014  . Pregnancy 01/15/2014  . URI 04/01/2010  . COMMON MIGRAINE 12/17/2006  . HYPERTENSION 12/17/2006    Medication List:  Allergies as of 07/26/2018      Reactions   Green Dyes Other (See Comments)   REACTION: hives   Ibuprofen Other (See Comments)   Esophagitis and chest pain    Milk-related Compounds       Medication List       Accurate as of July 26, 2018  9:51 AM. Always use your most recent  med list.        albuterol 108 (90 Base) MCG/ACT inhaler Commonly known as:  PROVENTIL HFA;VENTOLIN HFA Inhale 2 puffs into the lungs every 6 (six) hours as needed for wheezing or shortness of breath.       Birth History: non-contributory  Developmental History: Tonya [name] has met all milestones on time. She has required no speech therapy, occupational therapy and physical therapy.    Past Surgical History: Past Surgical History:  Procedure Laterality Date  . ABDOMINAL HYSTERECTOMY    . BACK SURGERY  2012  . BREAST LUMPECTOMY Left    Benign  . LAPAROSCOPIC VAGINAL HYSTERECTOMY WITH SALPINGECTOMY Bilateral 03/18/2017   Procedure: LAPAROSCOPIC ASSISTED VAGINAL HYSTERECTOMY WITH SALPINGECTOMY;  Surgeon: Marylynn Pearson, MD;  Location: [facility];  Service: Gynecology;  Laterality: Bilateral;  NEEDS BED  . SHOULDER SURGERY  2008, 2009   L shoulder, acromion shave and recurrent dislocation.  Marland Kitchen SPINE SURGERY  03/06/11   lumbar disc L5  . TUBAL LIGATION Bilateral 05/07/2014   Procedure: POST PARTUM TUBAL LIGATION;  Surgeon: Marylynn Pearson, MD;  Location: [facility];  Service: Gynecology;  Laterality: Bilateral;  . VAGINAL DELIVERY  2014, 2015     Family History: Family History  Problem Relation Age of Onset  . Diabetes Paternal Uncle   . Hypertension Paternal Grandmother   . Obesity Other   . Sleep apnea Other   . Hypertension Maternal Grandmother   . Hypertension Maternal Grandfather   . Diabetes Maternal Grandfather   . Stroke Maternal Grandfather   . Hypertension Paternal Grandfather   . Thrombophlebitis Paternal Grandfather   . Hyperlipidemia Paternal Grandfather      Social History: Tonya [name] lives at home with her husband and two children.  She lives in a house that is 78 years old.  There is hardwood in the main living areas and tile in the bedroom.  They have electric heating and central cooling.  There is a dog inside of the home.  There are no dust mite  covers.  There is no tobacco exposure.  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   Review of Systems  Constitutional: Negative.  Negative for fever, malaise/fatigue and weight loss.  HENT: Negative.  Negative for congestion, ear discharge, ear pain and nosebleeds.   Eyes: Negative for pain, discharge and redness.  Respiratory: Negative for cough, hemoptysis, sputum production, shortness of breath and wheezing.   Cardiovascular: Negative.  Negative for chest pain and palpitations.  Gastrointestinal: Positive for abdominal pain and heartburn.  Skin: Negative.  Negative for itching and rash.  Neurological: Negative for dizziness and headaches.  Endo/Heme/Allergies: Negative for environmental allergies. Does not bruise/bleed easily.       Objective:   Blood pressure 104/82, pulse 89, resp. rate 16, height 5' 6" (1.676 m), weight 219 lb (99.3 kg), last menstrual period 03/02/2017, SpO2 98 %. Body mass index is 35.35 kg/m.   Physical Exam:   Physical Exam  Constitutional: She appears well-developed.  HENT:  Head: Normocephalic and atraumatic.  Right Ear: Tympanic membrane, external ear and ear canal normal. No drainage, swelling or tenderness. Tympanic membrane is not injected, not scarred, not erythematous, not retracted and not bulging.  Left Ear: Tympanic membrane, external ear and ear canal normal. No drainage, swelling or tenderness. Tympanic membrane is not injected, not scarred, not erythematous, not retracted and not bulging.  Nose: No mucosal edema, rhinorrhea, nasal deformity or septal deviation. No epistaxis. Right sinus exhibits no maxillary sinus tenderness and no frontal sinus tenderness. Left sinus exhibits no maxillary sinus tenderness and no frontal sinus tenderness.  Mouth/Throat: Uvula is midline and oropharynx is clear and moist. Mucous membranes are not pale and not dry.  She does have some erythema of her bilateral turbinates.  Eyes: Pupils are  equal, round, and reactive to light. Conjunctivae and EOM are normal. Right eye exhibits no chemosis and no discharge. Left eye exhibits no chemosis and no discharge. Right conjunctiva is not injected. Left conjunctiva is not injected.  Cardiovascular: Normal rate, regular rhythm and normal heart sounds.  Respiratory: Effort normal and breath sounds normal. No accessory muscle usage. No tachypnea. No respiratory distress. She has no wheezes. She has no rhonchi. She has no rales. She exhibits no tenderness.  GI: There is no abdominal tenderness. There is no rebound and no guarding.  Lymphadenopathy:       Head (right side): No submandibular, no tonsillar and no occipital adenopathy present.       Head (left side): No submandibular, no tonsillar and no occipital adenopathy present.    She has no cervical adenopathy.  Neurological: She is alert.  Skin: No abrasion, no petechiae and no rash noted. Rash is not papular, not vesicular and not urticarial. No erythema. No pallor.  Psychiatric: She has a normal mood and affect.     Diagnostic studies:    Spirometry: results normal (FEV1: 3.14/93%, FVC: 3.75/93%, FEV1/FVC: 84%).    Spirometry consistent with normal pattern.   Allergy Studies:    Airborne Adult Perc - 07/26/18 0923    Time Antigen Placed  0920    Allergen Manufacturer  Lavella Hammock    Location  Back    Number of Test  59    Panel 1  Select    1. Control-Buffer 50% Glycerol  Negative    2. Control-Histamine 1 mg/ml  Negative    3. Albumin saline  Negative    4. Murdo  Negative    5. [place]  Negative    6. Johnson  Negative    7. [place] Blue  Negative    8. Meadow Fescue  Negative    9. Perennial Rye  Negative    10. Sweet Vernal  Negative  11. Timothy  Negative    12. Cocklebur  Negative    13. Burweed Marshelder  Negative    14. Ragweed, short  Negative    15. Ragweed, Giant  Negative    16. Plantain,  English  Negative    17. Lamb's Quarters  Negative    18. Sheep  Sorrell  Negative    19. Rough Pigweed  Negative    20. Marsh Elder, Rough  Negative    21. Mugwort, Common  Negative    22. Ash mix  Negative    23. Birch mix  Negative    24. Beech American  Negative    25. Box, Elder  Negative    26. Cedar, red  Negative    27. Cottonwood, [place]  Negative    28. Elm mix  Negative    29. Hickory mix  Negative    30. Maple mix  Negative    31. Oak, [place] mix  Negative    32. Pecan Pollen  Negative    33. Pine mix  Negative    34. Sycamore Eastern  Negative    35. [place], Black Pollen  Negative    36. Alternaria alternata  Negative    37. Cladosporium Herbarum  Negative    38. Aspergillus mix  Negative    39. Penicillium mix  Negative    40. Bipolaris sorokiniana (Helminthosporium)  Negative    41. Drechslera spicifera (Curvularia)  Negative    42. Mucor plumbeus  Negative    43. Fusarium moniliforme  Negative    44. Aureobasidium pullulans (pullulara)  Negative    45. Rhizopus oryzae  Negative    46. Botrytis cinera  Negative    47. Epicoccum nigrum  Negative    48. Phoma betae  Negative    49. Candida Albicans  Negative    50. Trichophyton mentagrophytes  Negative    51. Mite, D Farinae  5,000 AU/ml  Negative    52. Mite, D Pteronyssinus  5,000 AU/ml  Negative    53. Cat Hair 10,000 BAU/ml  Negative    54.  Dog Epithelia  Negative    55. Mixed Feathers  Negative    56. Horse Epithelia  Negative    57. Cockroach, German  Negative    58. Mouse  Negative    59. Tobacco Leaf  Negative     Food Adult Perc - 07/26/18 0900    Time Antigen Placed  2334    Allergen Manufacturer  Lavella Hammock    Location  Back    Number of allergen test  11    5. Milk, cow  2+    7. Casein  --   +1-   55. Grape (White seedless)  Negative    56. Orange   Negative    57. Banana  Negative    58. Apple  Negative    59. Peach  Negative    60. Strawberry  Negative    61. Cantaloupe  Negative    62. Watermelon  Negative    63. Pineapple  Negative         Allergy testing results were read and interpreted by myself, documented by clinical staff.       Salvatore Marvel, MD [facility]

## 2018-07-26 NOTE — Patient Instructions (Addendum)
1. Anaphylactic shock due to food - Testing was positive to milk and slightly reactive to casein, therefore I think you should avoid all milk products. Audry Riles demonstration provided. - They will call you in 1-2 days to confirm your shipping address. - Anaphylaxis management plan provided. - Testing was negative to the remainder of the foods we tested, including the fruits. - I am unsure of what to make of the mouth irritation from the fruits, but the testing today points against an anaphylactic reaction. - I thought that this was going to be oral allergy syndrome, but with the negative environmental testing, I do not think that this is the case.   2. Mild intermittent asthma, uncomplicated - Testing was normal today. - Continue with albuterol 2 puffs every 4-6 hours as needed for coughing/wheezing.   3. Adverse effect of drug (ibuprofen) - Your symptoms seem more consistent with reflux when you are exposed to the ibuprofen. - This seems to be the case due to the prevention of the reaction with Zantac. - I would consider bringing you in for a challenge to another NSAID - such as naproxen - so that we could get another option for future migraines. - In the meantime, you can continue to use Tylenol as needed.  4. Return in about 3 months (around 10/24/2018).   Please inform us of any Emergency Department visits, hospitalizations, or changes in symptoms. Call us before going to the ED for breathing or allergy symptoms since we might be able to fit you in for a sick visit. Feel free to contact us anytime with any questions, problems, or concerns.  It was a pleasure to meet you today!  Websites that have reliable patient information: 1. American Academy of Asthma, Allergy, and Immunology: www.aaaai.org 2. Food Allergy Research and Education (FARE): foodallergy.org 3. Mothers of Asthmatics: http://www.asthmacommunitynetwork.org 4. American College of Allergy, Asthma, and Immunology:  MissingWeapons.ca   Make sure you are registered to vote! If you have moved or changed any of your contact information, you will need to get this updated before voting!    Voter ID laws are POSSIBLY going into effect for the General Election in November 2020! Be prepared! Check out LandscapingDigest.dk for more details.

## 2018-10-25 ENCOUNTER — Ambulatory Visit: Payer: PRIVATE HEALTH INSURANCE | Admitting: Allergy & Immunology

## 2018-12-06 ENCOUNTER — Other Ambulatory Visit (HOSPITAL_COMMUNITY): Payer: Self-pay | Admitting: Family Medicine

## 2018-12-06 ENCOUNTER — Other Ambulatory Visit: Payer: Self-pay | Admitting: Family Medicine

## 2018-12-06 DIAGNOSIS — M545 Low back pain, unspecified: Secondary | ICD-10-CM

## 2018-12-07 ENCOUNTER — Other Ambulatory Visit: Payer: Self-pay

## 2018-12-07 ENCOUNTER — Ambulatory Visit (HOSPITAL_COMMUNITY)
Admission: RE | Admit: 2018-12-07 | Discharge: 2018-12-07 | Disposition: A | Discharge status: Home / Self Care | Payer: PRIVATE HEALTH INSURANCE | Source: Ambulatory Visit | Attending: Family Medicine | Admitting: Family Medicine

## 2018-12-07 DIAGNOSIS — M545 Low back pain, unspecified: Secondary | ICD-10-CM

## 2018-12-08 ENCOUNTER — Encounter: Payer: Self-pay | Admitting: Orthopaedic Surgery

## 2018-12-13 ENCOUNTER — Encounter: Payer: Self-pay | Admitting: Orthopaedic Surgery

## 2018-12-13 ENCOUNTER — Ambulatory Visit (HOSPITAL_COMMUNITY): Payer: PRIVATE HEALTH INSURANCE

## 2018-12-13 ENCOUNTER — Other Ambulatory Visit: Payer: Self-pay

## 2018-12-13 ENCOUNTER — Ambulatory Visit (INDEPENDENT_AMBULATORY_CARE_PROVIDER_SITE_OTHER): Payer: PRIVATE HEALTH INSURANCE | Admitting: Orthopaedic Surgery

## 2018-12-13 DIAGNOSIS — M5126 Other intervertebral disc displacement, lumbar region: Secondary | ICD-10-CM | POA: Insufficient documentation

## 2018-12-13 NOTE — Progress Notes (Signed)
Office Visit Note   Patient: Tonya [name]           Date of Birth: 02-12-1986           MRN: [id number] Visit Date: 12/13/2018              Requested by: Lianne Morisarroll, Erin, PA-C [address] [place],  [place] [id number] PCP: Lianne Morisarroll, Erin, PA-C   Assessment & Plan: Visit Diagnoses:  1. Protrusion of lumbar intervertebral disc     Plan: We reviewed the MRI scan today gave her a copy the report and we reviewed images.  She has single level disc degeneration with mild protrusion without nerve root significant displacement.  We will set her up for single epidural injection.  Currently with absence of nerve root tension signs and no weakness I discussed with her that she should get resolution of her symptoms with the injection.  Follow-up post epidural.  Follow-Up Instructions: Office follow-up after epidural.  Orders:  Orders Placed This Encounter  Procedures  . Ambulatory referral to Physical Medicine Rehab   No orders of the defined types were placed in this encounter.     Procedures: No procedures performed   Clinical Data: No additional findings.   Subjective: Chief Complaint  Patient presents with  . Lower Back - Pain    HPI 33 year old female with 2-week history of recurrent back and left leg pain.  She states she woke up 2 weeks ago with pain in her back without any injury.  Previous left L5-S1 microdiscectomy by me done on 03/06/2011 with good relief.  She is not had any problems in the last 8 years until the above problem.  She states her husband had help her out of bed she has no chills or fever.  PCP ordered MRI which is available for review.  This showed single level disc degeneration L5-S1 with small central left disc protrusion mild flattening.  Patient was treated with some Percocet and gabapentin.  She is only taken a couple tablets and states that over the gabapentin made her feel funny.  She had prednisone and states this is improved her symptoms.  Review of Systems  14 point review of systems is noncontributory to HPI.  She has 2 children age 656 and 4 no problems with back pain with pregnancy or after delivery.  Objective: Vital Signs: Ht 5\' 6"  (1.676 m)   Wt 220 lb (99.8 kg)   LMP 03/02/2017 (Approximate)   BMI 35.51 kg/m   Physical Exam Constitutional:      Appearance: She is well-developed.  HENT:     Head: Normocephalic.     Right Ear: External ear normal.     Left Ear: External ear normal.  Eyes:     Pupils: Pupils are equal, round, and reactive to light.  Neck:     Thyroid: No thyromegaly.     Trachea: No tracheal deviation.  Cardiovascular:     Rate and Rhythm: Normal rate.  Pulmonary:     Effort: Pulmonary effort is normal.  Abdominal:     Palpations: Abdomen is soft.  Skin:    General: Skin is warm and dry.  Neurological:     Mental Status: She is alert and oriented to person, place, and time.  Psychiatric:        Behavior: Behavior normal.     Ortho Exam well-healed lumbar incision L5S1.  She has some sciatic notch tenderness on the left negative logroll of the hips knee and  ankle jerk are intact.  Negative popliteal compression test she is able to heel and toe walk without difficulty.  No isolated motor weakness sensory testing is normal.  Specialty Comments:  No specialty comments available.  Imaging:CLINICAL DATA:  Low back pain. Right leg pain. History of back surgery.  EXAM: MRI LUMBAR SPINE WITHOUT CONTRAST  TECHNIQUE: Multiplanar, multisequence MR imaging of the lumbar spine was performed. No intravenous contrast was administered.  COMPARISON:  Lumbar MRI report 12/19/2010. Prior images not available for review. Lumbar radiographs 11/30/2018  FINDINGS: Segmentation:  Normal  Alignment:  Normal  Vertebrae:  Normal bone marrow.  Negative for fracture or mass.  Conus medullaris and cauda equina: Conus extends to the L1 level. Conus and cauda equina appear normal.  Paraspinal and other soft  tissues: Negative for paraspinous mass or adenopathy. No fluid collection.  Disc levels:  L1-2: Negative  L2-3: Negative  L3-4: Negative  L4-5: Negative  L5-S1: Postop laminectomy on the left. Small central and left-sided disc protrusion with mild flattening of the left S1 nerve root. Right S1 nerve root exits freely.  IMPRESSION: Postop laminectomy L5-S1. Persistent left paracentral disc protrusion as described on the prior MRI report 2012. Mild flattening left S1 nerve root. No cause for right-sided radiculopathy.   Electronically Signed   By: Marlan Palauharles  Clark M.D.   On: 12/07/2018 09:59    PMFS History: Patient Active Problem List   Diagnosis Date Noted  . Protrusion of lumbar intervertebral disc 12/13/2018  . Menorrhagia 03/18/2017  . Status post induction of labor 05/07/2014  . SVD (spontaneous vaginal delivery) 05/07/2014  . Near syncope 01/15/2014  . Pregnancy 01/15/2014  . URI 04/01/2010  . COMMON MIGRAINE 12/17/2006  . HYPERTENSION 12/17/2006   Past Medical History:  Diagnosis Date  . Cystitis    Occas  . Dizziness   . Frequent UTI   . Gestational diabetes    (H/O)   oral meds  . Hand tingling    And feet  . Headache(784.0)   . Hypertension    no meds  . PONV (postoperative nausea and vomiting)   . Rapid heart rate    during pregnancy     Family History  Problem Relation Age of Onset  . Diabetes Paternal Uncle   . Hypertension Paternal Grandmother   . Obesity Other   . Sleep apnea Other   . Hypertension Maternal Grandmother   . Hypertension Maternal Grandfather   . Diabetes Maternal Grandfather   . Stroke Maternal Grandfather   . Hypertension Paternal Grandfather   . Thrombophlebitis Paternal Grandfather   . Hyperlipidemia Paternal Grandfather     Past Surgical History:  Procedure Laterality Date  . ABDOMINAL HYSTERECTOMY    . BACK SURGERY  2012  . BREAST LUMPECTOMY Left    Benign  . LAPAROSCOPIC VAGINAL HYSTERECTOMY WITH  SALPINGECTOMY Bilateral 03/18/2017   Procedure: LAPAROSCOPIC ASSISTED VAGINAL HYSTERECTOMY WITH SALPINGECTOMY;  Surgeon: Zelphia CairoAdkins, Gretchen, MD;  [facility];  Service: Gynecology;  Laterality: Bilateral;  NEEDS BED  . SHOULDER SURGERY  2008, 2009   L shoulder, acromion shave and recurrent dislocation.  Marland Kitchen. SPINE SURGERY  03/06/11   lumbar disc L5  . TUBAL LIGATION Bilateral 05/07/2014   Procedure: POST PARTUM TUBAL LIGATION;  Surgeon: Zelphia CairoGretchen Adkins, MD;  Location: [facility];  Service: Gynecology;  Laterality: Bilateral;  . VAGINAL DELIVERY  2014, 2015   Social History   Occupational History  . Not on file  Tobacco Use  . Smoking status:  Former Smoker    Types: Cigarettes    Quit date: 2015    Years since quitting: 5.5  . Smokeless tobacco: Never Used  Substance and Sexual Activity  . Alcohol use: Yes    Alcohol/week: 0.0 standard drinks    Comment: occas  . Drug use: No  . Sexual activity: Not Currently    Birth control/protection: None

## 2019-01-03 ENCOUNTER — Ambulatory Visit (INDEPENDENT_AMBULATORY_CARE_PROVIDER_SITE_OTHER): Payer: PRIVATE HEALTH INSURANCE | Admitting: Physical Medicine and Rehabilitation

## 2019-01-03 ENCOUNTER — Other Ambulatory Visit: Payer: Self-pay

## 2019-01-03 ENCOUNTER — Ambulatory Visit: Payer: Self-pay

## 2019-01-03 ENCOUNTER — Encounter: Payer: Self-pay | Admitting: Physical Medicine and Rehabilitation

## 2019-01-03 VITALS — BP 137/96 | Temp 98.7°F

## 2019-01-03 DIAGNOSIS — M5416 Radiculopathy, lumbar region: Secondary | ICD-10-CM

## 2019-01-03 DIAGNOSIS — M961 Postlaminectomy syndrome, not elsewhere classified: Secondary | ICD-10-CM

## 2019-01-03 DIAGNOSIS — M5116 Intervertebral disc disorders with radiculopathy, lumbar region: Secondary | ICD-10-CM

## 2019-01-03 MED ORDER — BETAMETHASONE SOD PHOS & ACET 6 (3-3) MG/ML IJ SUSP
12.0000 mg | Freq: Once | INTRAMUSCULAR | Status: AC
  Administered 2019-01-03: 12 mg

## 2019-01-03 NOTE — Progress Notes (Signed)
 .  Numeric Pain Rating Scale and Functional Assessment Average Pain 7   In the last MONTH (on 0-10 scale) has pain interfered with the following?  1. General activity like being  able to carry out your everyday physical activities such as walking, climbing stairs, carrying groceries, or moving a chair?  Rating(4)   +Driver, -BT, -Dye Allergies.  

## 2019-01-04 NOTE — Procedures (Signed)
S1 Lumbosacral Transforaminal Epidural Steroid Injection - Sub-Pedicular Approach with Fluoroscopic Guidance   Patient: Tonya [name]      Date of Birth: 1986-05-30 MRN: [id number] PCP: Lanelle Bal, PA-C      Visit Date: 01/03/2019   Universal Protocol:    Date/Time: 07/29/205:58 AM  Consent Given By: the patient  Position:  PRONE  Additional Comments: Vital signs were monitored before and after the procedure. Patient was prepped and draped in the usual sterile fashion. The correct patient, procedure, and site was verified.   Injection Procedure Details:  Procedure Site One Meds Administered:  Meds ordered this encounter  Medications  . betamethasone acetate-betamethasone sodium phosphate (CELESTONE) injection 12 mg    Laterality: Left  Location/Site:  S1 Foramen   Needle size: 22 ga.  Needle type: Spinal  Needle Placement: Transforaminal  Findings:   -Comments: Excellent flow of contrast along the nerve and into the epidural space.  Procedure Details: After squaring off the sacral end-plate to get a true AP view, the C-arm was positioned so that the best possible view of the S1 foramen was visualized. The soft tissues overlying this structure were infiltrated with 2-3 ml. of 1% Lidocaine without Epinephrine.    The spinal needle was inserted toward the target using a "trajectory" view along the fluoroscope beam.  Under AP and lateral visualization, the needle was advanced so it did not puncture dura. Biplanar projections were used to confirm position. Aspiration was confirmed to be negative for CSF and/or blood. A 1-2 ml. volume of Isovue-250 was injected and flow of contrast was noted at each level. Radiographs were obtained for documentation purposes.   After attaining the desired flow of contrast documented above, a 0.5 to 1.0 ml test dose of 0.25% Marcaine was injected into each respective transforaminal space.  The patient was observed for 90 seconds post  injection.  After no sensory deficits were reported, and normal lower extremity motor function was noted,   the above injectate was administered so that equal amounts of the injectate were placed at each foramen (level) into the transforaminal epidural space.   Additional Comments:  The patient tolerated the procedure well Dressing: Band-Aid with 2 x 2 sterile gauze    Post-procedure details: Patient was observed during the procedure. Post-procedure instructions were reviewed.  Patient left the clinic in stable condition.

## 2019-01-04 NOTE — Progress Notes (Signed)
Tonya [name] - 33 y.o. female MRN [id number]  Date of birth: 11/12/1985  Office Visit Note: Visit Date: 01/03/2019 PCP: Lanelle Bal, PA-C Referred by: Lanelle Bal, PA-C  Subjective: Chief Complaint  Patient presents with  . Lower Back - Pain, Numbness   HPI:  Tonya [name] is a 33 y.o. female who comes in today At the request of Dr. Rodell Perna for diagnostic and therapeutic left S1 transforaminal epidural steroid injection.  Patient status post prior laminectomy at L5-S1 and discectomy.  Now has recurrent disc issues at this level with recurrent pain across the whole lower back but down the left leg in a classic S1 distribution.  MRI reviewed and reviewed below.  ROS Otherwise per HPI.  Assessment & Plan: Visit Diagnoses:  1. Lumbar radiculopathy   2. Post laminectomy syndrome   3. Radiculopathy due to lumbar intervertebral disc disorder     Plan: No additional findings.   Meds & Orders:  Meds ordered this encounter  Medications  . betamethasone acetate-betamethasone sodium phosphate (CELESTONE) injection 12 mg    Orders Placed This Encounter  Procedures  . XR C-ARM NO REPORT  . Epidural Steroid injection    Follow-up: Return if symptoms worsen or fail to improve.   Procedures: No procedures performed  S1 Lumbosacral Transforaminal Epidural Steroid Injection - Sub-Pedicular Approach with Fluoroscopic Guidance   Patient: Tonya [name]      Date of Birth: Aug 24, 1985 MRN: [id number] PCP: Lanelle Bal, PA-C      Visit Date: 01/03/2019   Universal Protocol:    Date/Time: 07/29/205:58 AM  Consent Given By: the patient  Position:  PRONE  Additional Comments: Vital signs were monitored before and after the procedure. Patient was prepped and draped in the usual sterile fashion. The correct patient, procedure, and site was verified.   Injection Procedure Details:  Procedure Site One Meds Administered:  Meds ordered this encounter  Medications  .  betamethasone acetate-betamethasone sodium phosphate (CELESTONE) injection 12 mg    Laterality: Left  Location/Site:  S1 Foramen   Needle size: 22 ga.  Needle type: Spinal  Needle Placement: Transforaminal  Findings:   -Comments: Excellent flow of contrast along the nerve and into the epidural space.  Procedure Details: After squaring off the sacral end-plate to get a true AP view, the C-arm was positioned so that the best possible view of the S1 foramen was visualized. The soft tissues overlying this structure were infiltrated with 2-3 ml. of 1% Lidocaine without Epinephrine.    The spinal needle was inserted toward the target using a "trajectory" view along the fluoroscope beam.  Under AP and lateral visualization, the needle was advanced so it did not puncture dura. Biplanar projections were used to confirm position. Aspiration was confirmed to be negative for CSF and/or blood. A 1-2 ml. volume of Isovue-250 was injected and flow of contrast was noted at each level. Radiographs were obtained for documentation purposes.   After attaining the desired flow of contrast documented above, a 0.5 to 1.0 ml test dose of 0.25% Marcaine was injected into each respective transforaminal space.  The patient was observed for 90 seconds post injection.  After no sensory deficits were reported, and normal lower extremity motor function was noted,   the above injectate was administered so that equal amounts of the injectate were placed at each foramen (level) into the transforaminal epidural space.   Additional Comments:  The patient tolerated the procedure well Dressing: Band-Aid with 2 x 2  sterile gauze    Post-procedure details: Patient was observed during the procedure. Post-procedure instructions were reviewed.  Patient left the clinic in stable condition.    Clinical History: MRI LUMBAR SPINE WITHOUT CONTRAST  TECHNIQUE: Multiplanar, multisequence MR imaging of the lumbar spine was  performed. No intravenous contrast was administered.  COMPARISON:  Lumbar MRI report 12/19/2010. Prior images not available for review. Lumbar radiographs 11/30/2018  FINDINGS: Segmentation:  Normal  Alignment:  Normal  Vertebrae:  Normal bone marrow.  Negative for fracture or mass.  Conus medullaris and cauda equina: Conus extends to the L1 level. Conus and cauda equina appear normal.  Paraspinal and other soft tissues: Negative for paraspinous mass or adenopathy. No fluid collection.  Disc levels:  L1-2: Negative  L2-3: Negative  L3-4: Negative  L4-5: Negative  L5-S1: Postop laminectomy on the left. Small central and left-sided disc protrusion with mild flattening of the left S1 nerve root. Right S1 nerve root exits freely.  IMPRESSION: Postop laminectomy L5-S1. Persistent left paracentral disc protrusion as described on the prior MRI report 2012. Mild flattening left S1 nerve root. No cause for right-sided radiculopathy.   Electronically Signed   By: Marlan Palauharles  Clark M.D.   On: 12/07/2018 09:59     Objective:  VS:  HT:    WT:   BMI:     BP:(!) 137/96  HR: bpm  TEMP:98.7 F (37.1 C)(Tympanic)  RESP:  Physical Exam  Ortho Exam Imaging: Xr C-arm No Report  Result Date: 01/03/2019 Please see Notes tab for imaging impression.

## 2019-01-10 ENCOUNTER — Encounter: Payer: Self-pay | Admitting: Orthopaedic Surgery

## 2019-01-10 ENCOUNTER — Ambulatory Visit (INDEPENDENT_AMBULATORY_CARE_PROVIDER_SITE_OTHER): Payer: PRIVATE HEALTH INSURANCE | Admitting: Orthopaedic Surgery

## 2019-01-10 ENCOUNTER — Other Ambulatory Visit: Payer: Self-pay

## 2019-01-10 VITALS — Ht 66.0 in | Wt 218.0 lb

## 2019-01-10 DIAGNOSIS — M5126 Other intervertebral disc displacement, lumbar region: Secondary | ICD-10-CM

## 2019-01-10 NOTE — Progress Notes (Signed)
Office Visit Note   Patient: Tonya [name]           Date of Birth: June 26, 1985           MRN: [id number] Visit Date: 01/10/2019              Requested by: Lanelle Bal, PA-C [place],  [place] [address] PCP: Lanelle Bal, PA-C   Assessment & Plan: Visit Diagnoses:  1. Protrusion of lumbar intervertebral disc           With left S1 radiculopathy  Plan: Patient has a recurrent disc protrusion after doing well for 8 years.  Long discussion with patient on options including fusion versus recurrent microdiscectomy.  Since she did well for 8 years I recommend proceeding with recurrent microdiscectomy on the left at L5-S1 for the recurrent disc protrusion with S1 nerve root compression and progressive left S1 weakness.  She has positive tension signs progressive and progressive weakness.  Plan will be overnight stay we discussed potential for further degeneration.  She does not have disc space collapse and all other disc levels above are normal.  Procedure was discussed questions were elicited and answered she understands and requests we proceed.  Follow-Up Instructions: Return pre-op.   Orders:  No orders of the defined types were placed in this encounter.  No orders of the defined types were placed in this encounter.     Procedures: No procedures performed   Clinical Data: No additional findings.   Subjective: Chief Complaint  Patient presents with  . Lower Back - Pain, Follow-up    HPI 33 year old female seen with greater than 6-week history of recurrent back and left leg pain sudden onset with persistent symptoms.  Previous microdiscectomy September 2012 with 8 years of good relief until she woke up with recurrent back and left leg pain.  She not been will pick up her children and her husband had help her out of bed each morning.  MRI scan shows recurrent disc protrusion on the left at L5-S1 with S1 nerve root displacement and she has had progressive leg weakness now  not able to walk on her toe on the left side due to persistent and increasing calf weakness.  She has been treated with anti-inflammatories, Percocet, gabapentin, prednisone Dosepak, epidural steroid injection transforaminal left S1 without relief on 01/03/2019.  She still taking ibuprofen not not currently taking narcotic medication.  Patient states she is ready to proceed with surgical intervention.  It bothers her on a daily basis she is not able to bend.  She has problems getting out of a car has pain when she uses the bathroom with back and left leg pain.  No symptoms on the right side.  No chills or fever.  No bowel  Or bladder symptoms other than back and leg pain with Valsalva with straining.  Pain level is moderate to severe worse with mention activities above.  Review of Systems foster childbirth 2 children age 76 and 4 otherwise 63 point review systems is entirely negative.  Only surgery is microdiscectomy left L5-S1 by me 2012.   Objective: Vital Signs: Ht 5\' 6"  (1.676 m)   Wt 218 lb (98.9 kg)   LMP 03/02/2017 (Approximate)   BMI 35.19 kg/m   Physical Exam Constitutional:      Appearance: She is well-developed.  HENT:     Head: Normocephalic.     Right Ear: External ear normal.     Left Ear: External ear normal.  Eyes:     Pupils: Pupils are equal, round, and reactive to light.  Neck:     Thyroid: No thyromegaly.     Trachea: No tracheal deviation.  Cardiovascular:     Rate and Rhythm: Normal rate.  Pulmonary:     Effort: Pulmonary effort is normal.  Abdominal:     Palpations: Abdomen is soft.  Skin:    General: Skin is warm and dry.  Neurological:     Mental Status: She is alert and oriented to person, place, and time.  Psychiatric:        Behavior: Behavior normal.     Ortho Exam patient has difficulty getting from sitting to standing due to back pain.  She can only do one single stance toe raise on the left and can do to 10 easily on the right side.  With toe  walking left heel hits repetitively due to weakness.  No anterior tib EHL weakness.  Peroneals weak on the left normal on the right.  Positive straight leg raising at 70 degrees on the left negative on the right.  Positive popliteal compression test positive sciatic notch tenderness.  Lumbar incisions well-healed.Knee jerk is 2+ and symmetrical.  Right ankle jerk 2+ absent ankle jerk on the left.  Specialty Comments:  No specialty comments available.  Imaging: Study Result  CLINICAL DATA:  Low back pain. Right leg pain. History of back surgery.  EXAM: MRI LUMBAR SPINE WITHOUT CONTRAST  TECHNIQUE: Multiplanar, multisequence MR imaging of the lumbar spine was performed. No intravenous contrast was administered.  COMPARISON:  Lumbar MRI report 12/19/2010. Prior images not available for review. Lumbar radiographs 11/30/2018  FINDINGS: Segmentation:  Normal  Alignment:  Normal  Vertebrae:  Normal bone marrow.  Negative for fracture or mass.  Conus medullaris and cauda equina: Conus extends to the L1 level. Conus and cauda equina appear normal.  Paraspinal and other soft tissues: Negative for paraspinous mass or adenopathy. No fluid collection.  Disc levels:  L1-2: Negative  L2-3: Negative  L3-4: Negative  L4-5: Negative  L5-S1: Postop laminectomy on the left. Small central and left-sided disc protrusion with mild flattening of the left S1 nerve root. Right S1 nerve root exits freely.  IMPRESSION: Postop laminectomy L5-S1. Persistent left paracentral disc protrusion as described on the prior MRI report 2012. Mild flattening left S1 nerve root. No cause for right-sided radiculopathy.   Electronically Signed   By: Marlan Palauharles  Clark M.D.   On: 12/07/2018 09:59       PMFS History: Patient Active Problem List   Diagnosis Date Noted  . Protrusion of lumbar intervertebral disc 12/13/2018  . Menorrhagia 03/18/2017  . Status post induction of labor  05/07/2014  . SVD (spontaneous vaginal delivery) 05/07/2014  . Near syncope 01/15/2014  . Pregnancy 01/15/2014  . URI 04/01/2010  . COMMON MIGRAINE 12/17/2006  . HYPERTENSION 12/17/2006   Past Medical History:  Diagnosis Date  . Cystitis    Occas  . Dizziness   . Frequent UTI   . Gestational diabetes    (H/O)   oral meds  . Hand tingling    And feet  . Headache(784.0)   . Hypertension    no meds  . PONV (postoperative nausea and vomiting)   . Rapid heart rate    during pregnancy     Family History  Problem Relation Age of Onset  . Diabetes Paternal Uncle   . Hypertension Paternal Grandmother   . Obesity Other   . Sleep apnea  Other   . Hypertension Maternal Grandmother   . Hypertension Maternal Grandfather   . Diabetes Maternal Grandfather   . Stroke Maternal Grandfather   . Hypertension Paternal Grandfather   . Thrombophlebitis Paternal Grandfather   . Hyperlipidemia Paternal Grandfather     Past Surgical History:  Procedure Laterality Date  . ABDOMINAL HYSTERECTOMY    . BACK SURGERY  2012  . BREAST LUMPECTOMY Left    Benign  . LAPAROSCOPIC VAGINAL HYSTERECTOMY WITH SALPINGECTOMY Bilateral 03/18/2017   Procedure: LAPAROSCOPIC ASSISTED VAGINAL HYSTERECTOMY WITH SALPINGECTOMY;  Surgeon: Zelphia CairoAdkins, Gretchen, MD;  [facility];  Service: Gynecology;  Laterality: Bilateral;  NEEDS BED  . SHOULDER SURGERY  2008, 2009   L shoulder, acromion shave and recurrent dislocation.  Marland Kitchen. SPINE SURGERY  03/06/11   lumbar disc L5  . TUBAL LIGATION Bilateral 05/07/2014   Procedure: POST PARTUM TUBAL LIGATION;  Surgeon: Zelphia CairoGretchen Adkins, MD;  Location: [facility];  Service: Gynecology;  Laterality: Bilateral;  . VAGINAL DELIVERY  2014, 2015   Social History   Occupational History  . Not on file  Tobacco Use  . Smoking status: Former Smoker    Types: Cigarettes    Quit date: 2015    Years since quitting: 5.5  . Smokeless tobacco: Never Used  Substance and  Sexual Activity  . Alcohol use: Yes    Alcohol/week: 0.0 standard drinks    Comment: occas  . Drug use: No  . Sexual activity: Not Currently    Birth control/protection: None

## 2019-01-16 ENCOUNTER — Telehealth: Payer: Self-pay | Admitting: Orthopaedic Surgery

## 2019-01-16 ENCOUNTER — Encounter: Payer: Self-pay | Admitting: Orthopaedic Surgery

## 2019-01-16 ENCOUNTER — Encounter (HOSPITAL_COMMUNITY): Payer: Self-pay

## 2019-01-16 NOTE — Telephone Encounter (Signed)
I called discussed. At [facility] now for arm xrays. Discussed using cane/walker to prevent falling.

## 2019-01-16 NOTE — Progress Notes (Signed)
[place], [place] [address] [place] [address] Phone: [phone] Fax: [phone]  [place], [place] [address] Phone: [phone] Fax: [phone]      Your procedure is scheduled on Wednesday, 01/25/19.  Report to [facility] Main Entrance "A" at 10:30 A.M., and check in at the Admitting office.  Call this number if you have problems the morning of surgery:  [phone]  Call [phone] if you have any questions prior to your surgery date Monday-Friday 8am-4pm    Remember:  Do not eat or drink after midnight the night before your surgery (Tues)   Take these medicines the morning of surgery with A SIP OF WATER  Celexa and Tylenol if needed.  7 days prior to surgery STOP taking any Aspirin (unless otherwise instructed by your surgeon), Aleve, Naproxen, Ibuprofen, Motrin, Advil, Goody's, BC's, all herbal medications, fish oil, and all vitamins.    The Morning of Surgery  Do not wear jewelry, make-up or nail polish.  Do not wear lotions, powders, or perfumes/colognes, or deodorant  Do not shave 48 hours prior to surgery.    Do not bring valuables to the hospital.  [facility] is not responsible for any belongings or valuables.  If you are a smoker, DO NOT Smoke 24 hours prior to surgery.  IF you wear a CPAP at night please bring your mask and tubing the morning of surgery.   Remember that you must have someone to transport you home after your surgery, and remain with you for 24 hours if you are discharged the same day.  Patients discharged the day of surgery will not be allowed to drive home.  Contacts, glasses, hearing aids, dentures or bridgework may not be worn into surgery.   For patients admitted to the hospital, discharge time will be determined by your treatment team.   Special instructions:   [place]- Preparing For  Surgery  Before surgery, you can play an important role. Because skin is not sterile, your skin needs to be as free of germs as possible. You can reduce the number of germs on your skin by washing with CHG (chlorahexidine gluconate) Soap before surgery.  CHG is an antiseptic cleaner which kills germs and bonds with the skin to continue killing germs even after washing.    Oral Hygiene is also important to reduce your risk of infection.  Remember - BRUSH YOUR TEETH THE MORNING OF SURGERY WITH YOUR REGULAR TOOTHPASTE  Please do not use if you have an allergy to CHG or antibacterial soaps. If your skin becomes reddened/irritated stop using the CHG.  Do not shave (including legs and underarms) for at least 48 hours prior to first CHG shower. It is OK to shave your face.  Please follow these instructions carefully.   1. Shower the [employer] BEFORE SURGERY (Tues) and the MORNING OF SURGERY (Wed) with CHG Soap.   2. If you chose to wash your hair, wash your hair first as usual with your normal shampoo.  3. After you shampoo, rinse your hair and body thoroughly to remove the shampoo.  4. Use CHG as you would any other liquid soap. You can apply CHG directly to the skin and wash gently with a scrungie or a clean washcloth.   5. Apply the CHG Soap to your body ONLY FROM THE NECK DOWN.  Do not use on open wounds or  open sores. Avoid contact with your eyes, ears, mouth and genitals (private parts). Wash Face and genitals (private parts)  with your normal soap.   6. Wash thoroughly, paying special attention to the area where your surgery will be performed.  7. Thoroughly rinse your body with warm water from the neck down.  8. DO NOT shower/wash with your normal soap after using and rinsing off the CHG Soap.  9. Pat yourself dry with a CLEAN TOWEL.  10. Wear CLEAN PAJAMAS to bed the night before surgery, wear comfortable clothes the morning of surgery  11. Place CLEAN SHEETS on your bed the night of  your first shower and DO NOT SLEEP WITH PETS.    Day of Surgery:  Do not apply any deodorants/lotions. Please shower the morning of surgery with the CHG soap  Please wear clean clothes to the hospital/surgery center.   Remember to brush your teeth WITH YOUR REGULAR TOOTHPASTE.   Please read over the following fact sheets that you were given.

## 2019-01-16 NOTE — Telephone Encounter (Signed)
Sending to you for return call.

## 2019-01-16 NOTE — Telephone Encounter (Signed)
Patient returned Dr. Lorin Mercy call.  She is available to talk to him.  CB#[phone]

## 2019-01-17 ENCOUNTER — Encounter (HOSPITAL_COMMUNITY): Payer: Self-pay

## 2019-01-17 ENCOUNTER — Other Ambulatory Visit: Payer: Self-pay

## 2019-01-17 ENCOUNTER — Encounter (HOSPITAL_COMMUNITY)
Admission: RE | Admit: 2019-01-17 | Discharge: 2019-01-17 | Disposition: A | Discharge status: Home / Self Care | Payer: PRIVATE HEALTH INSURANCE | Source: Ambulatory Visit | Attending: Orthopaedic Surgery | Admitting: Orthopaedic Surgery

## 2019-01-17 DIAGNOSIS — Z01818 Encounter for other preprocedural examination: Secondary | ICD-10-CM | POA: Insufficient documentation

## 2019-01-17 LAB — CBC
HCT: 44.6 % (ref 36.0–46.0)
Hemoglobin: 14.6 g/dL (ref 12.0–15.0)
MCH: 30.3 pg (ref 26.0–34.0)
MCHC: 32.7 g/dL (ref 30.0–36.0)
MCV: 92.5 fL (ref 80.0–100.0)
Platelets: 311 10*3/uL (ref 150–400)
RBC: 4.82 MIL/uL (ref 3.87–5.11)
RDW: 12.1 % (ref 11.5–15.5)
WBC: 11.5 10*3/uL — ABNORMAL HIGH (ref 4.0–10.5)
nRBC: 0 % (ref 0.0–0.2)

## 2019-01-17 LAB — COMPREHENSIVE METABOLIC PANEL
ALT: 14 U/L (ref 0–44)
AST: 15 U/L (ref 15–41)
Albumin: 3.7 g/dL (ref 3.5–5.0)
Alkaline Phosphatase: 78 U/L (ref 38–126)
Anion gap: 8 (ref 5–15)
BUN: 10 mg/dL (ref 6–20)
CO2: 24 mmol/L (ref 22–32)
Calcium: 8.7 mg/dL — ABNORMAL LOW (ref 8.9–10.3)
Chloride: 104 mmol/L (ref 98–111)
Creatinine, Ser: 0.63 mg/dL (ref 0.44–1.00)
GFR calc Af Amer: >60 mL/min (ref >60)
GFR calc non Af Amer: >60 mL/min (ref >60)
Glucose, Bld: 107 mg/dL — ABNORMAL HIGH (ref 70–99)
Potassium: 3.8 mmol/L (ref 3.5–5.1)
Sodium: 136 mmol/L (ref 135–145)
Total Bilirubin: 0.7 mg/dL (ref 0.3–1.2)
Total Protein: 6.8 g/dL (ref 6.5–8.1)

## 2019-01-17 LAB — URINALYSIS, ROUTINE W REFLEX MICROSCOPIC
Bacteria, UA: NONE SEEN
Bilirubin Urine: NEGATIVE
Glucose, UA: NEGATIVE mg/dL
Ketones, ur: NEGATIVE mg/dL
Leukocytes,Ua: NEGATIVE
Nitrite: NEGATIVE
Protein, ur: NEGATIVE mg/dL
Specific Gravity, Urine: 1.017 (ref 1.005–1.030)
pH: 5 (ref 5.0–8.0)

## 2019-01-17 LAB — SURGICAL PCR SCREEN
MRSA, PCR: NEGATIVE
Staphylococcus aureus: NEGATIVE

## 2019-01-17 NOTE — Progress Notes (Signed)
[facility] - [place], [place] - [id number] [place] [id number] Phone: [phone] Fax: [phone]  [facility] - [place], [place] - [address] [place] [id number] Phone: [phone] Fax: [phone]                 Your procedure is scheduled on Wednesday, 01/25/19.             Report to [facility] Main Entrance "A" at 10:30 A.M., and check in at the Admitting office.             Call this number if you have problems the morning of surgery:             [phone]  Call [phone] if you have any questions prior to your surgery date Monday-Friday 8am-4pm               Remember:             Do not eat after midnight the night before your surgery (Tues)  You may drink Clear liquids until 930 AM the morning of surgery. EX: Water, Juice (non-citric and without pulp), Clear Tea, Black Coffee only and Gatorade  Please complete your PRE-SURGERY ENSURE that was provided to you by 930 AM the morning of surgery.  Please, if able, drink it in one setting. DO NOT SIP.             Take these medicines the morning of surgery with A SIP OF WATER  Celexa and Tylenol if needed.  7 days prior to surgery STOP taking any Aspirin (unless otherwise instructed by your surgeon), Aleve, Naproxen, Ibuprofen, Motrin, Advil, Goody's, BC's, all herbal medications, fish oil, and all vitamins.               The Morning of Surgery             Do not wear jewelry, make-up or nail polish.             Do not wear lotions, powders, or perfumes/colognes, or deodorant             Do not shave 48 hours prior to surgery.               Do not bring valuables to the hospital.             [facility] is not responsible for any belongings or valuables.  If you are a smoker, DO NOT Smoke 24 hours prior to surgery.  IF you wear a CPAP at night please bring your mask and tubing the morning of surgery.   Remember that  you must have someone to transport you home after your surgery, and remain with you for 24 hours if you are discharged the same day.  Patients discharged the day of surgery will not be allowed to drive home.  Contacts, glasses, hearing aids, dentures or bridgework may not be worn into surgery.   For patients admitted to the hospital, discharge time will be determined by your treatment team.   Special instructions:   [place]- Preparing For Surgery  Before surgery, you can play an important role. Because skin is not sterile, your skin needs to be as free of germs as possible. You can reduce the number of germs on your skin by washing with CHG (chlorahexidine gluconate) Soap before surgery.  CHG is an antiseptic cleaner which kills germs and bonds with  the skin to continue killing germs even after washing.    Oral Hygiene is also important to reduce your risk of infection.  Remember - BRUSH YOUR TEETH THE MORNING OF SURGERY WITH YOUR REGULAR TOOTHPASTE  Please do not use if you have an allergy to CHG or antibacterial soaps. If your skin becomes reddened/irritated stop using the CHG.  Do not shave (including legs and underarms) for at least 48 hours prior to first CHG shower. It is OK to shave your face.  Please follow these instructions carefully.                                                                                                                               1. Shower the [employer] BEFORE SURGERY (Tues) and the MORNING OF SURGERY (Wed) with CHG Soap.   2. If you chose to wash your hair, wash your hair first as usual with your normal shampoo.  3. After you shampoo, rinse your hair and body thoroughly to remove the shampoo.  4. Use CHG as you would any other liquid soap. You can apply CHG directly to the skin and wash gently with a scrungie or a clean washcloth.   5. Apply the CHG Soap to your body ONLY FROM THE NECK DOWN.  Do not use on open wounds or open sores. Avoid  contact with your eyes, ears, mouth and genitals (private parts). Wash Face and genitals (private parts)  with your normal soap.   6. Wash thoroughly, paying special attention to the area where your surgery will be performed.  7. Thoroughly rinse your body with warm water from the neck down.  8. DO NOT shower/wash with your normal soap after using and rinsing off the CHG Soap.  9. Pat yourself dry with a CLEAN TOWEL.  10. Wear CLEAN PAJAMAS to bed the night before surgery, wear comfortable clothes the morning of surgery  11. Place CLEAN SHEETS on your bed the night of your first shower and DO NOT SLEEP WITH PETS.    Day of Surgery:  Do not apply any deodorants/lotions. Please shower the morning of surgery with the CHG soap  Please wear clean clothes to the hospital/surgery center.   Remember to brush your teeth WITH YOUR REGULAR TOOTHPASTE.   Please read over the following fact sheets that you were given.

## 2019-01-17 NOTE — Progress Notes (Addendum)
PCP - Lanelle Bal, PA-C Cardiologist - pt denies  Chest x-ray - pt denies EKG - pt denies  Stress Test - pt denies ECHO - pt denies  Cardiac Cath - pt denies  Sleep Study - pt denies CPAP -   Fasting Blood Sugar - n/a Checks Blood Sugar _____ times a day-n/a  Blood Thinner Instructions:  n/a Aspirin Instructions: n/a  Anesthesia review: pending labs, EKG  Patient denies shortness of breath, fever, cough and chest pain at PAT appointment  Patient verbalized understanding of instructions that were given to them at the PAT appointment. Patient was also instructed that they will need to review over the PAT instructions again at home before surgery.   Coronavirus Screening  Have you experienced the following symptoms:  Cough yes/no: No Fever (>100.62F)  yes/no: No Runny nose yes/no: No Sore throat yes/no: No Difficulty breathing/shortness of breath  yes/no: No  Have you or a family member traveled in the last 14 days and where? yes/no: No   If the patient indicates "YES" to the above questions, their PAT will be rescheduled to limit the exposure to others and, the surgeon will be notified. THE PATIENT WILL NEED TO BE ASYMPTOMATIC FOR 14 DAYS.   If the patient is not experiencing any of these symptoms, the PAT nurse will instruct them to NOT bring anyone with them to their appointment since they may have these symptoms or traveled as well.   Please remind your patients and families that hospital visitation restrictions are in effect and the importance of the restrictions.

## 2019-01-18 ENCOUNTER — Encounter: Payer: Self-pay | Admitting: Surgery

## 2019-01-18 ENCOUNTER — Ambulatory Visit (INDEPENDENT_AMBULATORY_CARE_PROVIDER_SITE_OTHER): Payer: PRIVATE HEALTH INSURANCE | Admitting: Surgery

## 2019-01-18 ENCOUNTER — Other Ambulatory Visit: Payer: Self-pay

## 2019-01-18 ENCOUNTER — Ambulatory Visit (INDEPENDENT_AMBULATORY_CARE_PROVIDER_SITE_OTHER): Payer: PRIVATE HEALTH INSURANCE

## 2019-01-18 ENCOUNTER — Ambulatory Visit: Payer: Self-pay

## 2019-01-18 VITALS — BP 133/88 | HR 79 | Ht 66.0 in | Wt 220.0 lb

## 2019-01-18 DIAGNOSIS — M5126 Other intervertebral disc displacement, lumbar region: Secondary | ICD-10-CM

## 2019-01-18 DIAGNOSIS — M25521 Pain in right elbow: Secondary | ICD-10-CM | POA: Diagnosis not present

## 2019-01-18 NOTE — Progress Notes (Signed)
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  States that symptoms continue to worsen.  She is wanting to proceed with left L5-S1 microdiscectomy as scheduled.  Patient states that on Monday she was walking at a gas station when her left leg gave out and she fell onto her right arm.  She was seen by her PCP who ordered x-rays.  Patient was advised that she did not have a fracture.  She was put in a sling.  She continues to complain of pain into the right elbow and forearm.  No numbness or tingling.  Exam Patient is moderate to markedly tender around the radial head.  Pain in this area with forearm supination and pronation.  She does have some proximal and mid forearm swelling.  Neurovascular intact.  Plan X-rays reviewed with Dr. Lorin Mercy today.  He recommends proceeding with surgery next Wednesday as scheduled.  I did advise patient to contact our office next Monday if she feels like her elbow pain is worsening.  Today history and physical performed.  Surgical procedure for L5-S1 microdiscectomy discussed.  All questions answered.

## 2019-01-21 ENCOUNTER — Other Ambulatory Visit (HOSPITAL_COMMUNITY)
Admission: RE | Admit: 2019-01-21 | Discharge: 2019-01-21 | Disposition: A | Discharge status: Home / Self Care | Payer: PRIVATE HEALTH INSURANCE | Source: Ambulatory Visit | Attending: Orthopaedic Surgery | Admitting: Orthopaedic Surgery

## 2019-01-21 DIAGNOSIS — Z01812 Encounter for preprocedural laboratory examination: Secondary | ICD-10-CM | POA: Insufficient documentation

## 2019-01-21 DIAGNOSIS — Z20828 Contact with and (suspected) exposure to other viral communicable diseases: Secondary | ICD-10-CM | POA: Insufficient documentation

## 2019-01-21 LAB — SARS CORONAVIRUS 2 (TAT 6-24 HRS): SARS Coronavirus 2: NEGATIVE

## 2019-01-24 ENCOUNTER — Other Ambulatory Visit (HOSPITAL_COMMUNITY): Payer: PRIVATE HEALTH INSURANCE

## 2019-01-25 ENCOUNTER — Encounter (HOSPITAL_COMMUNITY): Payer: Self-pay

## 2019-01-25 ENCOUNTER — Ambulatory Visit (HOSPITAL_COMMUNITY): Payer: PRIVATE HEALTH INSURANCE | Admitting: Certified Registered"

## 2019-01-25 ENCOUNTER — Ambulatory Visit (HOSPITAL_COMMUNITY): Payer: PRIVATE HEALTH INSURANCE

## 2019-01-25 ENCOUNTER — Other Ambulatory Visit: Payer: Self-pay

## 2019-01-25 ENCOUNTER — Observation Stay (HOSPITAL_COMMUNITY)
Admission: RE | Admit: 2019-01-25 | Discharge: 2019-01-26 | Disposition: A | Discharge status: Home / Self Care | Payer: PRIVATE HEALTH INSURANCE | Attending: Orthopaedic Surgery | Admitting: Orthopaedic Surgery

## 2019-01-25 ENCOUNTER — Encounter (HOSPITAL_COMMUNITY)
Admission: RE | Disposition: A | Discharge status: Home / Self Care | Payer: Self-pay | Source: Home / Self Care | Attending: Orthopaedic Surgery

## 2019-01-25 DIAGNOSIS — M5126 Other intervertebral disc displacement, lumbar region: Secondary | ICD-10-CM | POA: Diagnosis present

## 2019-01-25 DIAGNOSIS — Z886 Allergy status to analgesic agent status: Secondary | ICD-10-CM | POA: Insufficient documentation

## 2019-01-25 DIAGNOSIS — Z87891 Personal history of nicotine dependence: Secondary | ICD-10-CM | POA: Insufficient documentation

## 2019-01-25 DIAGNOSIS — Z419 Encounter for procedure for purposes other than remedying health state, unspecified: Secondary | ICD-10-CM

## 2019-01-25 DIAGNOSIS — M5117 Intervertebral disc disorders with radiculopathy, lumbosacral region: Secondary | ICD-10-CM | POA: Diagnosis not present

## 2019-01-25 DIAGNOSIS — Z79899 Other long term (current) drug therapy: Secondary | ICD-10-CM | POA: Insufficient documentation

## 2019-01-25 HISTORY — PX: LUMBAR LAMINECTOMY: SHX95

## 2019-01-25 SURGERY — MICRODISCECTOMY LUMBAR LAMINECTOMY
Anesthesia: General | Laterality: Left

## 2019-01-25 MED ORDER — ONDANSETRON HCL 4 MG/2ML IJ SOLN: 4.0000 mg | Freq: Four times a day (QID) | INTRAMUSCULAR | Status: DC | PRN

## 2019-01-25 MED ORDER — ACETAMINOPHEN 325 MG PO TABS
650.0000 mg | ORAL_TABLET | ORAL | Status: DC | PRN
  Administered 2019-01-25 – 2019-01-26 (×2): 650 mg via ORAL
  Filled 2019-01-25 (×2): qty 2

## 2019-01-25 MED ORDER — OXYCODONE HCL 5 MG PO TABS
ORAL_TABLET | ORAL | Status: AC
  Administered 2019-01-25: 15:00:00 5 mg via ORAL
  Filled 2019-01-25: qty 1

## 2019-01-25 MED ORDER — METHOCARBAMOL 500 MG PO TABS
500.0000 mg | ORAL_TABLET | Freq: Four times a day (QID) | ORAL | Status: DC | PRN
  Administered 2019-01-25 – 2019-01-26 (×3): 500 mg via ORAL
  Filled 2019-01-25 (×3): qty 1

## 2019-01-25 MED ORDER — OXYCODONE HCL 5 MG/5ML PO SOLN: 5.0000 mg | Freq: Once | ORAL | Status: AC | PRN

## 2019-01-25 MED ORDER — ONDANSETRON HCL 4 MG/2ML IJ SOLN
INTRAMUSCULAR | Status: AC
  Filled 2019-01-25: qty 2

## 2019-01-25 MED ORDER — 0.9 % SODIUM CHLORIDE (POUR BTL) OPTIME
TOPICAL | Status: DC | PRN
  Administered 2019-01-25: 1000 mL

## 2019-01-25 MED ORDER — ROCURONIUM BROMIDE 10 MG/ML (PF) SYRINGE
PREFILLED_SYRINGE | INTRAVENOUS | Status: DC | PRN
  Administered 2019-01-25: 60 mg via INTRAVENOUS
  Administered 2019-01-25: 20 mg via INTRAVENOUS

## 2019-01-25 MED ORDER — DEXAMETHASONE SODIUM PHOSPHATE 10 MG/ML IJ SOLN
INTRAMUSCULAR | Status: AC
  Filled 2019-01-25: qty 1

## 2019-01-25 MED ORDER — ONDANSETRON HCL 4 MG PO TABS: 4.0000 mg | ORAL_TABLET | Freq: Four times a day (QID) | ORAL | Status: DC | PRN

## 2019-01-25 MED ORDER — MENTHOL 3 MG MT LOZG: 1.0000 | LOZENGE | OROMUCOSAL | Status: DC | PRN

## 2019-01-25 MED ORDER — CEFAZOLIN SODIUM-DEXTROSE 2-4 GM/100ML-% IV SOLN
2.0000 g | INTRAVENOUS | Status: AC
  Administered 2019-01-25: 2 g via INTRAVENOUS

## 2019-01-25 MED ORDER — LIDOCAINE 2% (20 MG/ML) 5 ML SYRINGE
INTRAMUSCULAR | Status: DC | PRN
  Administered 2019-01-25: 60 mg via INTRAVENOUS
  Administered 2019-01-25: 40 mg via INTRAVENOUS

## 2019-01-25 MED ORDER — ONDANSETRON HCL 4 MG/2ML IJ SOLN
INTRAMUSCULAR | Status: DC | PRN
  Administered 2019-01-25: 4 mg via INTRAVENOUS

## 2019-01-25 MED ORDER — SODIUM CHLORIDE 0.9% FLUSH
3.0000 mL | Freq: Two times a day (BID) | INTRAVENOUS | Status: DC
  Administered 2019-01-25: 3 mL via INTRAVENOUS

## 2019-01-25 MED ORDER — PROPOFOL 10 MG/ML IV BOLUS
INTRAVENOUS | Status: AC
  Filled 2019-01-25: qty 20

## 2019-01-25 MED ORDER — FENTANYL CITRATE (PF) 100 MCG/2ML IJ SOLN: 25.0000 ug | INTRAMUSCULAR | Status: DC | PRN

## 2019-01-25 MED ORDER — GLYCOPYRROLATE PF 0.2 MG/ML IJ SOSY
PREFILLED_SYRINGE | INTRAMUSCULAR | Status: AC
  Filled 2019-01-25: qty 1

## 2019-01-25 MED ORDER — SCOPOLAMINE 1 MG/3DAYS TD PT72
MEDICATED_PATCH | TRANSDERMAL | Status: AC
  Administered 2019-01-25: 1.5 mg via TRANSDERMAL
  Filled 2019-01-25: qty 1

## 2019-01-25 MED ORDER — ARTIFICIAL TEARS OPHTHALMIC OINT
TOPICAL_OINTMENT | OPHTHALMIC | Status: AC
  Filled 2019-01-25: qty 3.5

## 2019-01-25 MED ORDER — ACETAMINOPHEN 650 MG RE SUPP: 650.0000 mg | RECTAL | Status: DC | PRN

## 2019-01-25 MED ORDER — SODIUM CHLORIDE 0.9 % IV SOLN: INTRAVENOUS | Status: DC

## 2019-01-25 MED ORDER — CITALOPRAM HYDROBROMIDE 20 MG PO TABS
40.0000 mg | ORAL_TABLET | Freq: Every day | ORAL | Status: DC
  Administered 2019-01-25: 40 mg via ORAL
  Filled 2019-01-25 (×2): qty 2

## 2019-01-25 MED ORDER — METHOCARBAMOL 500 MG PO TABS
ORAL_TABLET | ORAL | Status: AC
  Administered 2019-01-25: 500 mg via ORAL
  Filled 2019-01-25: qty 1

## 2019-01-25 MED ORDER — FENTANYL CITRATE (PF) 250 MCG/5ML IJ SOLN
INTRAMUSCULAR | Status: AC
  Filled 2019-01-25: qty 5

## 2019-01-25 MED ORDER — OXYCODONE HCL 5 MG PO TABS
5.0000 mg | ORAL_TABLET | Freq: Once | ORAL | Status: AC | PRN
  Administered 2019-01-25: 15:00:00 5 mg via ORAL

## 2019-01-25 MED ORDER — LACTATED RINGERS IV SOLN
INTRAVENOUS | Status: DC
  Administered 2019-01-25: 11:00:00 via INTRAVENOUS

## 2019-01-25 MED ORDER — SODIUM CHLORIDE 0.9% FLUSH: 3.0000 mL | INTRAVENOUS | Status: DC | PRN

## 2019-01-25 MED ORDER — PROMETHAZINE HCL 25 MG/ML IJ SOLN: 6.2500 mg | INTRAMUSCULAR | Status: DC | PRN

## 2019-01-25 MED ORDER — BUPIVACAINE HCL (PF) 0.25 % IJ SOLN
INTRAMUSCULAR | Status: DC | PRN
  Administered 2019-01-25: 10 mL

## 2019-01-25 MED ORDER — SODIUM CHLORIDE 0.9 % IV SOLN
INTRAVENOUS | Status: DC | PRN
  Administered 2019-01-25: 30 ug/min via INTRAVENOUS

## 2019-01-25 MED ORDER — DEXAMETHASONE SODIUM PHOSPHATE 10 MG/ML IJ SOLN
INTRAMUSCULAR | Status: DC | PRN
  Administered 2019-01-25: 10 mg via INTRAVENOUS

## 2019-01-25 MED ORDER — HYDROMORPHONE HCL 1 MG/ML IJ SOLN
0.5000 mg | INTRAMUSCULAR | Status: DC | PRN
  Administered 2019-01-25 (×2): 0.5 mg via INTRAVENOUS
  Filled 2019-01-25 (×2): qty 0.5

## 2019-01-25 MED ORDER — SCOPOLAMINE 1 MG/3DAYS TD PT72
1.0000 | MEDICATED_PATCH | Freq: Once | TRANSDERMAL | Status: DC
  Administered 2019-01-25: 11:00:00 1.5 mg via TRANSDERMAL

## 2019-01-25 MED ORDER — MIDAZOLAM HCL 5 MG/5ML IJ SOLN
INTRAMUSCULAR | Status: DC | PRN
  Administered 2019-01-25: 2 mg via INTRAVENOUS

## 2019-01-25 MED ORDER — METHOCARBAMOL 1000 MG/10ML IJ SOLN
500.0000 mg | Freq: Four times a day (QID) | INTRAVENOUS | Status: DC | PRN
  Filled 2019-01-25: qty 5

## 2019-01-25 MED ORDER — PROPOFOL 500 MG/50ML IV EMUL
INTRAVENOUS | Status: DC | PRN
  Administered 2019-01-25: 50 ug/kg/min via INTRAVENOUS

## 2019-01-25 MED ORDER — CEFAZOLIN SODIUM-DEXTROSE 1-4 GM/50ML-% IV SOLN
1.0000 g | Freq: Three times a day (TID) | INTRAVENOUS | Status: AC
  Administered 2019-01-25 – 2019-01-26 (×2): 1 g via INTRAVENOUS
  Filled 2019-01-25 (×2): qty 50

## 2019-01-25 MED ORDER — FENTANYL CITRATE (PF) 100 MCG/2ML IJ SOLN
INTRAMUSCULAR | Status: DC | PRN
  Administered 2019-01-25 (×2): 100 ug via INTRAVENOUS

## 2019-01-25 MED ORDER — LIDOCAINE 2% (20 MG/ML) 5 ML SYRINGE
INTRAMUSCULAR | Status: AC
  Filled 2019-01-25: qty 5

## 2019-01-25 MED ORDER — OXYCODONE HCL 5 MG PO TABS
5.0000 mg | ORAL_TABLET | ORAL | Status: DC | PRN
  Administered 2019-01-25 – 2019-01-26 (×5): 5 mg via ORAL
  Filled 2019-01-25 (×5): qty 1

## 2019-01-25 MED ORDER — ACETAMINOPHEN 10 MG/ML IV SOLN: 1000.0000 mg | Freq: Once | INTRAVENOUS | Status: DC | PRN

## 2019-01-25 MED ORDER — ROCURONIUM BROMIDE 10 MG/ML (PF) SYRINGE
PREFILLED_SYRINGE | INTRAVENOUS | Status: AC
  Filled 2019-01-25: qty 10

## 2019-01-25 MED ORDER — BUPIVACAINE HCL (PF) 0.25 % IJ SOLN
INTRAMUSCULAR | Status: AC
  Filled 2019-01-25: qty 30

## 2019-01-25 MED ORDER — CEFAZOLIN SODIUM-DEXTROSE 2-4 GM/100ML-% IV SOLN
INTRAVENOUS | Status: AC
  Filled 2019-01-25: qty 100

## 2019-01-25 MED ORDER — SUGAMMADEX SODIUM 200 MG/2ML IV SOLN
INTRAVENOUS | Status: DC | PRN
  Administered 2019-01-25: 200 mg via INTRAVENOUS

## 2019-01-25 MED ORDER — CHLORHEXIDINE GLUCONATE 4 % EX LIQD: 60.0000 mL | Freq: Once | CUTANEOUS | Status: DC

## 2019-01-25 MED ORDER — POLYETHYLENE GLYCOL 3350 17 G PO PACK: 17.0000 g | PACK | Freq: Every day | ORAL | Status: DC | PRN

## 2019-01-25 MED ORDER — DOCUSATE SODIUM 100 MG PO CAPS
100.0000 mg | ORAL_CAPSULE | Freq: Two times a day (BID) | ORAL | Status: DC
  Administered 2019-01-25: 20:00:00 100 mg via ORAL
  Filled 2019-01-25: qty 1

## 2019-01-25 MED ORDER — PHENOL 1.4 % MT LIQD: 1.0000 | OROMUCOSAL | Status: DC | PRN

## 2019-01-25 MED ORDER — MIDAZOLAM HCL 2 MG/2ML IJ SOLN
INTRAMUSCULAR | Status: AC
  Filled 2019-01-25: qty 2

## 2019-01-25 MED ORDER — PROPOFOL 10 MG/ML IV BOLUS
INTRAVENOUS | Status: DC | PRN
  Administered 2019-01-25: 180 mg via INTRAVENOUS
  Administered 2019-01-25: 20 mg via INTRAVENOUS

## 2019-01-25 SURGICAL SUPPLY — 48 items
ADH SKN CLS APL DERMABOND .7 (GAUZE/BANDAGES/DRESSINGS)
APL SKNCLS STERI-STRIP NONHPOA (GAUZE/BANDAGES/DRESSINGS) ×1
BENZOIN TINCTURE PRP APPL 2/3 (GAUZE/BANDAGES/DRESSINGS) ×2 IMPLANT
BUR ROUND FLUTED 4 SOFT TCH (BURR) IMPLANT
BUR ROUND FLUTED 4MM SOFT TCH (BURR)
CANISTER SUCT 3000ML PPV (MISCELLANEOUS) ×3 IMPLANT
CLOSURE STERI-STRIP 1/2X4 (GAUZE/BANDAGES/DRESSINGS) ×1
CLSR STERI-STRIP ANTIMIC 1/2X4 (GAUZE/BANDAGES/DRESSINGS) ×1 IMPLANT
COVER SURGICAL LIGHT HANDLE (MISCELLANEOUS) ×3 IMPLANT
COVER WAND RF STERILE (DRAPES) ×3 IMPLANT
DERMABOND ADVANCED (GAUZE/BANDAGES/DRESSINGS)
DERMABOND ADVANCED .7 DNX12 (GAUZE/BANDAGES/DRESSINGS) ×1 IMPLANT
DRAPE MICROSCOPE LEICA (MISCELLANEOUS) ×3 IMPLANT
DRSG MEPILEX BORDER 4X4 (GAUZE/BANDAGES/DRESSINGS) ×2 IMPLANT
DRSG MEPILEX BORDER 4X8 (GAUZE/BANDAGES/DRESSINGS) IMPLANT
DURAPREP 26ML APPLICATOR (WOUND CARE) ×3 IMPLANT
DURASEAL SPINE SEALANT 3ML (MISCELLANEOUS) IMPLANT
ELECT REM PT RETURN 9FT ADLT (ELECTROSURGICAL) ×3
ELECTRODE REM PT RTRN 9FT ADLT (ELECTROSURGICAL) ×1 IMPLANT
GAUZE SPONGE 4X4 12PLY STRL (GAUZE/BANDAGES/DRESSINGS) ×3 IMPLANT
GLOVE BIOGEL PI IND STRL 8 (GLOVE) ×2 IMPLANT
GLOVE BIOGEL PI INDICATOR 8 (GLOVE) ×4
GLOVE ORTHO TXT STRL SZ7.5 (GLOVE) ×6 IMPLANT
GOWN STRL REUS W/ TWL LRG LVL3 (GOWN DISPOSABLE) ×1 IMPLANT
GOWN STRL REUS W/ TWL XL LVL3 (GOWN DISPOSABLE) ×1 IMPLANT
GOWN STRL REUS W/TWL 2XL LVL3 (GOWN DISPOSABLE) ×3 IMPLANT
GOWN STRL REUS W/TWL LRG LVL3 (GOWN DISPOSABLE) ×3
GOWN STRL REUS W/TWL XL LVL3 (GOWN DISPOSABLE) ×3
KIT BASIN OR (CUSTOM PROCEDURE TRAY) ×3 IMPLANT
KIT TURNOVER KIT B (KITS) ×3 IMPLANT
NDL SPNL 18GX3.5 QUINCKE PK (NEEDLE) ×1 IMPLANT
NEEDLE SPNL 18GX3.5 QUINCKE PK (NEEDLE) ×3 IMPLANT
NS IRRIG 1000ML POUR BTL (IV SOLUTION) ×3 IMPLANT
PACK LAMINECTOMY ORTHO (CUSTOM PROCEDURE TRAY) ×3 IMPLANT
PAD ARMBOARD 7.5X6 YLW CONV (MISCELLANEOUS) ×6 IMPLANT
PATTIES SURGICAL .5 X.5 (GAUZE/BANDAGES/DRESSINGS) IMPLANT
PATTIES SURGICAL .75X.75 (GAUZE/BANDAGES/DRESSINGS) IMPLANT
SUT VIC AB 0 CT1 27 (SUTURE) ×3
SUT VIC AB 0 CT1 27XBRD ANBCTR (SUTURE) IMPLANT
SUT VIC AB 1 CTX 36 (SUTURE) ×3
SUT VIC AB 1 CTX36XBRD ANBCTR (SUTURE) ×1 IMPLANT
SUT VIC AB 2-0 CT1 27 (SUTURE) ×3
SUT VIC AB 2-0 CT1 TAPERPNT 27 (SUTURE) ×1 IMPLANT
SUT VIC AB 3-0 X1 27 (SUTURE) ×2 IMPLANT
SYR 20ML ECCENTRIC (SYRINGE) IMPLANT
TOWEL GREEN STERILE (TOWEL DISPOSABLE) ×3 IMPLANT
TOWEL GREEN STERILE FF (TOWEL DISPOSABLE) ×3 IMPLANT
WATER STERILE IRR 1000ML POUR (IV SOLUTION) ×3 IMPLANT

## 2019-01-25 NOTE — H&P (Signed)
Tonya BrickHeather H [name] is an 33 y.o. female.   Chief Complaint: low back pain and Left LE radiculopathy HPI: 33 year old white female history of low back pain and left lower extremity radiculopathy secondary to recurrent left L5-S1 HNP comes in for preop evaluation.  States that symptoms continue to worsen.  She is wanting to proceed with left L5-S1 microdiscectomy as scheduled.   Past Medical History:  Diagnosis Date  . Cystitis    Occas  . Dizziness   . Frequent UTI   . Hand tingling    And feet  . Headache(784.0)   . Hypertension    no meds, patient reports this only while pregnant, not currently  . PONV (postoperative nausea and vomiting)   . Rapid heart rate    during pregnancy     Past Surgical History:  Procedure Laterality Date  . ABDOMINAL HYSTERECTOMY    . BACK SURGERY  2012  . BREAST LUMPECTOMY Left    Benign  . LAPAROSCOPIC VAGINAL HYSTERECTOMY WITH SALPINGECTOMY Bilateral 03/18/2017   Procedure: LAPAROSCOPIC ASSISTED VAGINAL HYSTERECTOMY WITH SALPINGECTOMY;  Surgeon: Zelphia CairoAdkins, Gretchen, MD;  [facility];  Service: Gynecology;  Laterality: Bilateral;  NEEDS BED  . SHOULDER SURGERY  2008, 2009   L shoulder, acromion shave and recurrent dislocation.  Marland Kitchen. SPINE SURGERY  03/06/11   lumbar disc L5  . TUBAL LIGATION Bilateral 05/07/2014   Procedure: POST PARTUM TUBAL LIGATION;  Surgeon: Zelphia CairoGretchen Adkins, MD;  Location: [facility];  Service: Gynecology;  Laterality: Bilateral;  . VAGINAL DELIVERY  2014, 2015    Family History  Problem Relation Age of Onset  . Diabetes Paternal Uncle   . Hypertension Paternal Grandmother   . Obesity Other   . Sleep apnea Other   . Hypertension Maternal Grandmother   . Hypertension Maternal Grandfather   . Diabetes Maternal Grandfather   . Stroke Maternal Grandfather   . Hypertension Paternal Grandfather   . Thrombophlebitis Paternal Grandfather   . Hyperlipidemia Paternal Grandfather    Social History:  reports that she  quit smoking about 5 years ago. Her smoking use included cigarettes. She has never used smokeless tobacco. She reports current alcohol use. She reports that she does not use drugs.  Allergies:  Allergies  Allergen Reactions  . Green Dyes Other (See Comments)    REACTION: hives  . Ibuprofen Other (See Comments)    Esophagitis and chest pain - pt states that its ok to take with heartburn medication  . Milk-Related Compounds     Medications Prior to Admission  Medication Sig Dispense Refill  . acetaminophen (TYLENOL) 500 MG tablet Take 500 mg by mouth every 6 (six) hours as needed (pain).    . citalopram (CELEXA) 40 MG tablet Take 40 mg by mouth daily.       No results found for this or any previous visit (from the past 48 hour(s)). No results found.  Review of Systems  Constitutional: Negative.   HENT: Negative.   Respiratory: Negative.   Cardiovascular: Negative.   Musculoskeletal: Positive for back pain.  Neurological: Positive for tingling.  Psychiatric/Behavioral: Negative.     Last menstrual period 03/02/2017. Physical Exam  Constitutional: She is oriented to person, place, and time. She appears well-developed.  HENT:  Head: Normocephalic and atraumatic.  Eyes: Pupils are equal, round, and reactive to light. EOM are normal.  Neck: Normal range of motion.  Cardiovascular: Normal heart sounds.  Respiratory: Effort normal. No respiratory distress.  GI: Soft. She exhibits no distension.  There is no abdominal tenderness.  Musculoskeletal:        General: Tenderness present.  Neurological: She is alert and oriented to person, place, and time.  Skin: Skin is warm and dry.  Psychiatric: She has a normal mood and affect.     Assessment/Plan Recurrent Left L5-S1 HNP  We will proceed with L5-S1 microdiscectomy as scheduled.  Surgical procedure along with possible risks and complications discussed.  All questions answered and she wishes to proceed   Benjiman Core,  PA-C 01/25/2019, 10:32 AM

## 2019-01-25 NOTE — Interval H&P Note (Signed)
History and Physical Interval Note:  01/25/2019 12:25 PM  Tonya [name]  has presented today for surgery, with the diagnosis of recurrent left L5-S1, protrusion.  The various methods of treatment have been discussed with the patient and family. After consideration of risks, benefits and other options for treatment, the patient has consented to  Procedure(s): LEFT L5-S1, RECURRENT MICRODISCECTOMY (N/A) as a surgical intervention.  The patient's history has been reviewed, patient examined, no change in status, stable for surgery.  I have reviewed the patient's chart and labs.  Questions were answered to the patient's satisfaction.     Marybelle Killings

## 2019-01-25 NOTE — Op Note (Signed)
Preop diagnosis: Left L5-S1 recurrent disc protrusion with radiculopathy.  Postop diagnosis: Same  Procedure: Left L5-S1 microdiscectomy for recurrent disc protrusion.  Surgeon: Rodell Perna, MD  Assistant: Benjiman Core, PA-C medically necessary and present for the entire procedure  Anesthesia General plus Marcaine skin local.  Procedure after induction general anesthesia patient placed prone on chest rolls preoperative Ancef prophylaxis 2 g careful padding positioning arms at 9090 rolled yellow foam crates anterior to the shoulder.  Area was prepped with DuraPrep 1010 was applied at the S2 level.  Area squared with towels sterile skin marker on the old incision and Betadine Steri-Drape.  Timeout procedure completed.  Incision was made opening the old incision we progressed down through 7 cm adipose layers to the fascia and release the fascia off of the spinous process L5 and S1 progressing up the lamina.  Patient already had a laminotomy on the left we moved up and took a little bit of more lamina on the left side thinning it with a 4 mm bur falling around laterally removing a small portion of the facet joint for exposure and then removing chunks of ligament which had reformed which was small in amount.  There is some scar tissue bipolar cautery was used for some epidural veins.  Pedicle was visualized medial aspect of the pedicle superior aspect of the pedicle and disc was cleaned off trach was used to protect the nerve root and the dura midline.  Annulus was incised passes were made with micropituitary straight and up-biting regular pituitary and also regular upbiter.  Gradually progressed removing some pieces there was some disc protruding at the midline which was not extruded was retained by the ligament is able to be pushed down into the disc space and then removed with the micropituitary.  Continue working until sweeps anterior to the dura was flat without compression and nerve root was free as it  exited around and inferior to the pedicle on the left side.  Out laterally at the foramina nerve was free L5 and there was no areas of compression.  Irrigation with saline solution final passes through the disc base make sure there were no remaining fragments passes anterior to the dura above the disc without evidence of protrusion.  Repeat irrigation fascia closed with #1 Vicryl 2-0 Vicryl subtenons tissue 3-0 Vicryl subcuticular closure Marcaine infiltration the skin tincture benzoin Steri-Strips 4 x 4 and dressing was applied.

## 2019-01-25 NOTE — Anesthesia Procedure Notes (Addendum)
Procedure Name: Intubation Date/Time: 01/25/2019 12:51 PM Performed by: Cleda Daub, CRNA Pre-anesthesia Checklist: Patient identified, Emergency Drugs available, Suction available and Patient being monitored Patient Re-evaluated:Patient Re-evaluated prior to induction Oxygen Delivery Method: Circle system utilized Preoxygenation: Pre-oxygenation with 100% oxygen Induction Type: IV induction Ventilation: Mask ventilation without difficulty and Mask ventilation throughout procedure Laryngoscope Size: Mac and 3 Grade View: Grade I Tube type: Oral Tube size: 7.0 mm Number of attempts: 1 Airway Equipment and Method: Stylet Placement Confirmation: ETT inserted through vocal cords under direct vision,  positive ETCO2 and breath sounds checked- equal and bilateral Secured at: 21 cm Tube secured with: Tape Dental Injury: Teeth and Oropharynx as per pre-operative assessment

## 2019-01-25 NOTE — Anesthesia Preprocedure Evaluation (Addendum)
Anesthesia Evaluation  Patient identified by MRN, date of birth, ID band Patient awake    Reviewed: Allergy & Precautions, NPO status , Patient's Chart, lab work & pertinent test results  History of Anesthesia Complications (+) PONV and history of anesthetic complications  Airway Mallampati: II  TM Distance: >3 FB Neck ROM: Full    Dental no notable dental hx.    Pulmonary former smoker,    Pulmonary exam normal        Cardiovascular hypertension, Normal cardiovascular exam     Neuro/Psych  Headaches,    GI/Hepatic negative GI ROS, Neg liver ROS,   Endo/Other  negative endocrine ROS  Renal/GU negative Renal ROS     Musculoskeletal negative musculoskeletal ROS (+)   Abdominal   Peds  Hematology negative hematology ROS (+)   Anesthesia Other Findings Day of surgery medications reviewed with the patient.  Reproductive/Obstetrics                            Anesthesia Physical Anesthesia Plan  ASA: II  Anesthesia Plan: General   Post-op Pain Management:    Induction: Intravenous  PONV Risk Score and Plan: 4 or greater and Treatment may vary due to age or medical condition, Ondansetron, Dexamethasone, Midazolam, Scopolamine patch - Pre-op and Propofol infusion  Airway Management Planned: Oral ETT  Additional Equipment: None  Intra-op Plan:   Post-operative Plan: Extubation in OR  Informed Consent: I have reviewed the patients History and Physical, chart, labs and discussed the procedure including the risks, benefits and alternatives for the proposed anesthesia with the patient or authorized representative who has indicated his/her understanding and acceptance.     Dental advisory given  Plan Discussed with: CRNA  Anesthesia Plan Comments:        Anesthesia Quick Evaluation

## 2019-01-25 NOTE — Evaluation (Signed)
Occupational Therapy Evaluation Patient Details Name: Tonya [name] MRN: [id number] DOB: Jul 02, 1985 Today's Date: 01/25/2019    History of Present Illness 33 yo female presenting with lower back pain and LLE weakness. S/p left L5-S1 microdiscectomy. PMH including HTN, back surgery (2012), and Shoulder surgery (LUE).   Clinical Impression   PTA, pt was living with her husband and two children and was independent. Currently, pt performing for ADLs and functional mobility at Mod I- Independent level. Provided handout and education on back precautions, bed mobility, LB ADLs, and toileting; pt demonstrated understanding. Answered all pt questions. Recommend dc home once medically stable per physician. All acute OT needs met and will sign off. Thank you.     Follow Up Recommendations  No OT follow up    Equipment Recommendations  None recommended by OT    Recommendations for Other Services       Precautions / Restrictions Precautions Precautions: Back Precaution Booklet Issued: Yes (comment) Precaution Comments: Provided handout and educatio non abck precautions and compensatory techniques Required Braces or Orthoses: Other Brace Other Brace: No brace per MD order Restrictions Weight Bearing Restrictions: No      Mobility Bed Mobility Overal bed mobility: Independent             General bed mobility comments: Educating pt on log roll technique. Pt performing independently  Transfers Overall transfer level: Independent                    Balance Overall balance assessment: No apparent balance deficits (not formally assessed)                                         ADL either performed or assessed with clinical judgement   ADL Overall ADL's : Modified independent                                       General ADL Comments: Pt performing ADLs and functional mobility with increased time as needed. Demonstrating understanding of  precautions and techniques     Vision         Perception     Praxis      Pertinent Vitals/Pain Pain Assessment: Faces Faces Pain Scale: Hurts even more Pain Location: Back Pain Descriptors / Indicators: Discomfort;Grimacing Pain Intervention(s): Monitored during session;Repositioned;Premedicated before session     Hand Dominance Right   Extremity/Trunk Assessment Upper Extremity Assessment Upper Extremity Assessment: Overall WFL for tasks assessed   Lower Extremity Assessment Lower Extremity Assessment: LLE deficits/detail LLE Deficits / Details: Left leg weakness prior to sx   Cervical / Trunk Assessment Cervical / Trunk Assessment: Other exceptions Cervical / Trunk Exceptions: s/p back sx   Communication Communication Communication: No difficulties   Cognition Arousal/Alertness: Awake/alert Behavior During Therapy: WFL for tasks assessed/performed Overall Cognitive Status: Within Functional Limits for tasks assessed                                     General Comments  Practiced stairs in preparation for dc home    Exercises     Shoulder Instructions      Home Living Family/patient expects to be discharged to:: Private residence Living Arrangements: Spouse/significant other;Children(5 and 60 yo)  Available Help at Discharge: Family;Available 24 hours/day Type of Home: House Home Access: Stairs to enter [employer] of Steps: 5 and 7 (front and back) Entrance Stairs-Rails: Can reach both Home Layout: Two level;Able to live on main level with bedroom/bathroom;Laundry or work area in basement     [employer]: Occupational psychologist: Handicapped height     [place]: [place] in          Prior Functioning/Environment Level of Independence: Independent        Comments: ADLs, IADLs, drives, works        OT Problem List: Decreased activity tolerance;Decreased knowledge of  precautions;Pain      OT Treatment/Interventions:      OT Goals(Current goals can be found in the care plan section) Acute Rehab OT Goals Patient Stated Goal: "Go home tomorrow" OT Goal Formulation: All assessment and education complete, DC therapy  OT Frequency:     Barriers to D/C:            Co-evaluation              AM-PAC OT "6 Clicks" Daily Activity     Outcome Measure Help from another person eating meals?: None Help from another person taking care of personal grooming?: None Help from another person toileting, which includes using toliet, bedpan, or urinal?: None Help from another person bathing (including washing, rinsing, drying)?: None Help from another person to put on and taking off regular upper body clothing?: None Help from another person to put on and taking off regular lower body clothing?: None 6 Click Score: 24   End of Session Nurse Communication: Mobility status  Activity Tolerance: Patient tolerated treatment well Patient left: in chair;with call bell/phone within reach  OT Visit Diagnosis: Unsteadiness on feet (R26.81);Other abnormalities of gait and mobility (R26.89);Muscle weakness (generalized) (M62.81)                Time: 0923-3007 OT Time Calculation (min): 21 min Charges:  OT General Charges $OT Visit: 1 Visit OT Evaluation $OT Eval Low Complexity: [place], OTR/L Acute Rehab Pager: [phone] Office: [place] 01/25/2019, 4:42 PM

## 2019-01-25 NOTE — Transfer of Care (Signed)
Immediate Anesthesia Transfer of Care Note  Patient: Tonya [name]  Procedure(s) Performed: LEFT LUMBAR FIVE TO SACRAL ONE, RECURRENT MICRODISCECTOMY (Left )  Patient Location: PACU  Anesthesia Type:General  Level of Consciousness: awake, alert  and oriented  Airway & Oxygen Therapy: Patient Spontanous Breathing  Post-op Assessment: Report given to RN and Post -op Vital signs reviewed and stable  Post vital signs: Reviewed and stable  Last Vitals:  Vitals Value Taken Time  BP 144/93 01/25/19 1421  Temp    Pulse 93 01/25/19 1422  Resp 16 01/25/19 1422  SpO2 94 % 01/25/19 1422  Vitals shown include unvalidated device data.  Last Pain:  Vitals:   01/25/19 1036  TempSrc: Oral         Complications: No apparent anesthesia complications

## 2019-01-26 ENCOUNTER — Ambulatory Visit: Payer: PRIVATE HEALTH INSURANCE | Admitting: Allergy & Immunology

## 2019-01-26 ENCOUNTER — Encounter (HOSPITAL_COMMUNITY): Payer: Self-pay | Admitting: Orthopaedic Surgery

## 2019-01-26 DIAGNOSIS — M5117 Intervertebral disc disorders with radiculopathy, lumbosacral region: Secondary | ICD-10-CM | POA: Diagnosis not present

## 2019-01-26 MED ORDER — METHOCARBAMOL 500 MG PO TABS: 500.0000 mg | ORAL_TABLET | Freq: Three times a day (TID) | ORAL | 0 refills | Status: DC | PRN

## 2019-01-26 MED ORDER — OXYCODONE-ACETAMINOPHEN 5-325 MG PO TABS: 1.0000 | ORAL_TABLET | ORAL | 0 refills | Status: DC | PRN

## 2019-01-26 NOTE — Discharge Instructions (Signed)
Your dressing is waterproof it is okay to shower and dry off.  Walk daily gradually progressing into you can work your way up over several weeks to 2 miles a day.  Avoid bending and repetitive stooping as well as lifting.  Pain medication has been sent in as well as muscle relaxant to your pharmacy.  Take the pain medicine as needed if you take it regularly you may have problems with constipation.  See Dr. Lorin Mercy in 1 week.

## 2019-01-26 NOTE — Progress Notes (Signed)
Pt doing well. Pt given D/C instructions with verbal understanding. Rx's were sent to pharmacy by MD. Pt's incision is clean and dry with no sign of infection. Dressing was changed per MD order. IV was removed prior to D/C. Pt D/C'd home via wheelchair @ [phone] per MD order. Pt is stable @ D/C and has no other needs at this time. Holli Humbles, RN

## 2019-01-26 NOTE — Anesthesia Postprocedure Evaluation (Signed)
Anesthesia Post Note  Patient: Tonya [name]  Procedure(s) Performed: LEFT LUMBAR FIVE TO SACRAL ONE, RECURRENT MICRODISCECTOMY (Left )     Patient location during evaluation: PACU Anesthesia Type: General Level of consciousness: awake and alert and oriented Pain management: pain level controlled Vital Signs Assessment: post-procedure vital signs reviewed and stable Respiratory status: spontaneous breathing, nonlabored ventilation and respiratory function stable Cardiovascular status: blood pressure returned to baseline Postop Assessment: no apparent nausea or vomiting Anesthetic complications: no    Brennan Bailey

## 2019-01-27 NOTE — Discharge Summary (Signed)
Patient ID: FAYOLA MECKES MRN: [id number] DOB/AGE: Jul 09, 1985 33 y.o.  Admit date: 01/25/2019 Discharge date: 01/27/2019  Admission Diagnoses:  Active Problems:   Protrusion of lumbar intervertebral disc   HNP (herniated nucleus pulposus), lumbar   Discharge Diagnoses:  Active Problems:   Protrusion of lumbar intervertebral disc   HNP (herniated nucleus pulposus), lumbar  status post Procedure(s): LEFT LUMBAR FIVE TO SACRAL ONE, RECURRENT MICRODISCECTOMY  Past Medical History:  Diagnosis Date  . Cystitis    Occas  . Dizziness   . Frequent UTI   . Hand tingling    And feet  . Headache(784.0)   . Hypertension    no meds, patient reports this only while pregnant, not currently  . PONV (postoperative nausea and vomiting)   . Rapid heart rate    during pregnancy     Surgeries: Procedure(s): LEFT LUMBAR FIVE TO SACRAL ONE, RECURRENT MICRODISCECTOMY on 01/25/2019   Consultants:   Discharged Condition: Improved  Hospital Course: ALIANA KREISCHER is an 33 y.o. female who was admitted 01/25/2019 for operative treatment of recurrent lumbar HNP. Patient failed conservative treatments (please see the history and physical for the specifics) and had severe unremitting pain that affects sleep, daily activities and work/hobbies. After pre-op clearance, the patient was taken to the operating room on 01/25/2019 and underwent  Procedure(s): LEFT LUMBAR FIVE TO SACRAL ONE, RECURRENT MICRODISCECTOMY.    Patient was given perioperative antibiotics:  Anti-infectives (From admission, onward)   Start     Dose/Rate Route Frequency Ordered Stop   01/25/19 2100  ceFAZolin (ANCEF) IVPB 1 g/50 mL premix     1 g 100 mL/hr over 30 Minutes Intravenous Every 8 hours 01/25/19 1532 01/26/19 0430   01/25/19 1045  ceFAZolin (ANCEF) IVPB 2g/100 mL premix     2 g 200 mL/hr over 30 Minutes Intravenous On call to O.R. 01/25/19 1043 01/25/19 1300       Patient was given sequential compression devices  and early ambulation to prevent DVT.   Patient benefited maximally from hospital stay and there were no complications. At the time of discharge, the patient was urinating/moving their bowels without difficulty, tolerating a regular diet, pain is controlled with oral pain medications and they have been cleared by PT/OT.   Recent vital signs: No data found.   Recent laboratory studies: No results for input(s): WBC, HGB, HCT, PLT, NA, K, CL, CO2, BUN, CREATININE, GLUCOSE, INR, CALCIUM in the last 72 hours.  Invalid input(s): PT, 2   Discharge Medications:   Allergies as of 01/26/2019      Reactions   Green Dyes Other (See Comments)   REACTION: hives   Ibuprofen Other (See Comments)   Esophagitis and chest pain - pt states that its ok to take with heartburn medication   Milk-related Compounds       Medication List    STOP taking these medications   acetaminophen 500 MG tablet Commonly known as: TYLENOL     TAKE these medications   citalopram 40 MG tablet Commonly known as: CELEXA Take 40 mg by mouth daily.   methocarbamol 500 MG tablet Commonly known as: ROBAXIN Take 1 tablet (500 mg total) by mouth every 8 (eight) hours as needed for muscle spasms.   oxyCODONE-acetaminophen 5-325 MG tablet Commonly known as: Percocet Take 1 tablet by mouth every 4 (four) hours as needed for severe pain. Post op pain       Diagnostic Studies: Dg Lumbar Spine 1 View  Result Date:  01/25/2019 CLINICAL DATA:  L5-S1 micro discectomy. EXAM: LUMBAR SPINE - 1 VIEW COMPARISON:  Radiographs of November 30, 2018. FINDINGS: Single intraoperative cross-table lateral projection of the lumbar spine demonstrates surgical probe directed toward the L5-S1 level. IMPRESSION: Surgical localization as described above. Electronically Signed   By: Lupita RaiderJames  Green Jr M.D.   On: 01/25/2019 13:26   Xr C-arm No Report  Result Date: 01/03/2019 Please see Notes tab for imaging impression.  Xr Forearm Right  Result Date:  01/18/2019 Right forearm x-rays reviewed with Dr. Ophelia CharterYates today.  He did not see an obvious fracture.   Discharge Instructions    Incentive spirometry RT   Complete by: As directed       Follow-up Information    Eldred MangesYates, Mark C, MD Follow up in 1 week(s).   Specialty: Orthopedic Surgery Contact information: [address] [place] [address] [phone]           Discharge Plan:  discharge to home  Disposition:     Signed: Zonia KiefJames Bodhi Stenglein  01/27/2019, 3:26 PM

## 2019-02-02 ENCOUNTER — Ambulatory Visit (INDEPENDENT_AMBULATORY_CARE_PROVIDER_SITE_OTHER): Payer: PRIVATE HEALTH INSURANCE | Admitting: Surgery

## 2019-02-02 ENCOUNTER — Encounter: Payer: Self-pay | Admitting: Surgery

## 2019-02-02 DIAGNOSIS — Z9889 Other specified postprocedural states: Secondary | ICD-10-CM

## 2019-02-02 MED ORDER — HYDROCODONE-ACETAMINOPHEN 7.5-325 MG PO TABS: 1.0000 | ORAL_TABLET | Freq: Four times a day (QID) | ORAL | 0 refills | Status: DC | PRN

## 2019-02-02 NOTE — Progress Notes (Signed)
Post-Op Visit Note   Patient: Tonya [name]           Date of Birth: 11-03-1985           MRN: [id number] Visit Date: 02/02/2019 PCP: Lianne Morisarroll, Erin, PA-C   Assessment & Plan:  Chief Complaint:  Chief Complaint  Patient presents with  . Lower Back - Routine Post Op  Patient returns.  She is 1 week status post left L5-S1 microdiscectomy for recurrent HNP.  States that she had been doing well up until a couple of days ago when she began having some radicular pain down the left leg.  She does have 2 small kids at home that she must take care of.  She also has been doing some walking around the home. Visit Diagnoses:  1. S/P lumbar microdiscectomy     Plan: Patient will avoid bending, twisting, lifting.  Gradually increase her walking.  She has 2 small kids at home that she is taking care of and this does force her to do a little bit more which can aggravate her back.  She will be very cautious of her body mechanics.  Given prescription for Norco 7.5/325.  Follow-up in 5 weeks for recheck with Dr. Ophelia CharterYates.  Recommend that she be out of work at least 6 weeks postop.  Follow-Up Instructions: Return in about 5 weeks (around 03/09/2019) for with Dr Ophelia CharterYates for postop lumbar .   Orders:  No orders of the defined types were placed in this encounter.  Meds ordered this encounter  Medications  . HYDROcodone-acetaminophen (NORCO) 7.5-325 MG tablet    Sig: Take 1 tablet by mouth every 6 (six) hours as needed for moderate pain.    Dispense:  50 tablet    Refill:  0    Imaging: No results found.  PMFS History: Patient Active Problem List   Diagnosis Date Noted  . HNP (herniated nucleus pulposus), lumbar 01/25/2019  . Protrusion of lumbar intervertebral disc 12/13/2018  . Menorrhagia 03/18/2017  . Status post induction of labor 05/07/2014  . SVD (spontaneous vaginal delivery) 05/07/2014  . Near syncope 01/15/2014  . Pregnancy 01/15/2014  . URI 04/01/2010  . COMMON MIGRAINE 12/17/2006   . HYPERTENSION 12/17/2006   Past Medical History:  Diagnosis Date  . Cystitis    Occas  . Dizziness   . Frequent UTI   . Hand tingling    And feet  . Headache(784.0)   . Hypertension    no meds, patient reports this only while pregnant, not currently  . PONV (postoperative nausea and vomiting)   . Rapid heart rate    during pregnancy     Family History  Problem Relation Age of Onset  . Diabetes Paternal Uncle   . Hypertension Paternal Grandmother   . Obesity Other   . Sleep apnea Other   . Hypertension Maternal Grandmother   . Hypertension Maternal Grandfather   . Diabetes Maternal Grandfather   . Stroke Maternal Grandfather   . Hypertension Paternal Grandfather   . Thrombophlebitis Paternal Grandfather   . Hyperlipidemia Paternal Grandfather     Past Surgical History:  Procedure Laterality Date  . ABDOMINAL HYSTERECTOMY    . BACK SURGERY  2012  . BREAST LUMPECTOMY Left    Benign  . LAPAROSCOPIC VAGINAL HYSTERECTOMY WITH SALPINGECTOMY Bilateral 03/18/2017   Procedure: LAPAROSCOPIC ASSISTED VAGINAL HYSTERECTOMY WITH SALPINGECTOMY;  Surgeon: Zelphia CairoAdkins, Gretchen, MD;  [facility];  Service: Gynecology;  Laterality: Bilateral;  NEEDS BED  .  LUMBAR LAMINECTOMY Left 01/25/2019   Procedure: LEFT LUMBAR FIVE TO SACRAL ONE, RECURRENT MICRODISCECTOMY;  Surgeon: Marybelle Killings, MD;  Location: [facility];  Service: Orthopedics;  Laterality: Left;  . SHOULDER SURGERY  2008, 2009   L shoulder, acromion shave and recurrent dislocation.  Marland Kitchen SPINE SURGERY  03/06/11   lumbar disc L5  . TUBAL LIGATION Bilateral 05/07/2014   Procedure: POST PARTUM TUBAL LIGATION;  Surgeon: Marylynn Pearson, MD;  Location: [facility];  Service: Gynecology;  Laterality: Bilateral;  . VAGINAL DELIVERY  2014, 2015   Social History   Occupational History  . Not on file  Tobacco Use  . Smoking status: Former Smoker    Types: Cigarettes    Quit date: 2015    Years since quitting: 5.6  .  Smokeless tobacco: Never Used  Substance and Sexual Activity  . Alcohol use: Yes    Alcohol/week: 0.0 standard drinks    Comment: occas  . Drug use: No  . Sexual activity: Not Currently    Birth control/protection: None   Exam Wound looks good.  When I did remove a few of patient Steri-Strips she did have slight bleeding.  New Steri-Strips applied.  No signs of infection.  Neurologically intact.

## 2019-02-07 ENCOUNTER — Encounter: Payer: Self-pay | Admitting: Orthopaedic Surgery

## 2019-02-08 ENCOUNTER — Encounter: Payer: Self-pay | Admitting: Orthopaedic Surgery

## 2019-02-09 ENCOUNTER — Ambulatory Visit (INDEPENDENT_AMBULATORY_CARE_PROVIDER_SITE_OTHER): Payer: PRIVATE HEALTH INSURANCE

## 2019-02-09 ENCOUNTER — Ambulatory Visit (INDEPENDENT_AMBULATORY_CARE_PROVIDER_SITE_OTHER): Payer: PRIVATE HEALTH INSURANCE | Admitting: Orthopaedic Surgery

## 2019-02-09 ENCOUNTER — Encounter: Payer: Self-pay | Admitting: Orthopaedic Surgery

## 2019-02-09 ENCOUNTER — Other Ambulatory Visit: Payer: Self-pay

## 2019-02-09 VITALS — BP 129/68 | HR 71 | Ht 66.0 in | Wt 220.0 lb

## 2019-02-09 DIAGNOSIS — M545 Low back pain: Secondary | ICD-10-CM

## 2019-02-09 DIAGNOSIS — Z9889 Other specified postprocedural states: Secondary | ICD-10-CM

## 2019-02-09 DIAGNOSIS — M5126 Other intervertebral disc displacement, lumbar region: Secondary | ICD-10-CM
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
## 2019-02-20 ENCOUNTER — Encounter: Payer: Self-pay | Admitting: Orthopaedic Surgery

## 2019-03-01 ENCOUNTER — Encounter: Payer: Self-pay | Admitting: Orthopaedic Surgery

## 2019-03-01 ENCOUNTER — Other Ambulatory Visit: Payer: Self-pay

## 2019-03-01 ENCOUNTER — Ambulatory Visit (INDEPENDENT_AMBULATORY_CARE_PROVIDER_SITE_OTHER): Payer: PRIVATE HEALTH INSURANCE | Admitting: Orthopaedic Surgery

## 2019-03-01 VITALS — BP 148/103 | HR 85 | Ht 66.0 in | Wt 212.0 lb

## 2019-03-01 DIAGNOSIS — Z9889 Other specified postprocedural states: Secondary | ICD-10-CM

## 2019-03-01 DIAGNOSIS — M7062 Trochanteric bursitis, left hip: Secondary | ICD-10-CM | POA: Diagnosis not present

## 2019-03-01 MED ORDER — HYDROCODONE-ACETAMINOPHEN 5-325 MG PO TABS: 1.0000 | ORAL_TABLET | Freq: Three times a day (TID) | ORAL | 0 refills | Status: DC | PRN

## 2019-03-01 MED ORDER — LIDOCAINE HCL 1 % IJ SOLN
3.0000 mL | INTRAMUSCULAR | Status: AC | PRN
  Administered 2019-03-01: 10:00:00 3 mL

## 2019-03-01 MED ORDER — METHOCARBAMOL 500 MG PO TABS: 500.0000 mg | ORAL_TABLET | Freq: Three times a day (TID) | ORAL | 0 refills | Status: DC | PRN
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
## 2019-03-09 NOTE — Telephone Encounter (Signed)
IC pt insurance company and left vm with [employer] asking if authorization is required for MRI. Pending call back from [place].

## 2019-03-14 ENCOUNTER — Other Ambulatory Visit: Payer: Self-pay

## 2019-03-14 ENCOUNTER — Ambulatory Visit (HOSPITAL_COMMUNITY)
Admission: RE | Admit: 2019-03-14 | Discharge: 2019-03-14 | Disposition: A | Discharge status: Home / Self Care | Payer: PRIVATE HEALTH INSURANCE | Source: Ambulatory Visit | Attending: Surgery | Admitting: Surgery

## 2019-03-14 DIAGNOSIS — Z9889 Other specified postprocedural states: Secondary | ICD-10-CM | POA: Insufficient documentation

## 2019-03-14 MED ORDER — GADOBUTROL 1 MMOL/ML IV SOLN
10.0000 mL | Freq: Once | INTRAVENOUS | Status: AC | PRN
  Administered 2019-03-14: 10 mL via INTRAVENOUS

## 2019-03-15 ENCOUNTER — Ambulatory Visit (HOSPITAL_COMMUNITY): Payer: PRIVATE HEALTH INSURANCE

## 2019-03-15 ENCOUNTER — Encounter: Payer: Self-pay | Admitting: Surgery

## 2019-03-15 ENCOUNTER — Ambulatory Visit (INDEPENDENT_AMBULATORY_CARE_PROVIDER_SITE_OTHER): Payer: PRIVATE HEALTH INSURANCE | Admitting: Surgery

## 2019-03-15 DIAGNOSIS — M5126 Other intervertebral disc displacement, lumbar region: Secondary | ICD-10-CM

## 2019-03-15 DIAGNOSIS — Z9889 Other specified postprocedural states: Secondary | ICD-10-CM

## 2019-03-15 DIAGNOSIS — M5416 Radiculopathy, lumbar region: Secondary | ICD-10-CM

## 2019-03-15 MED ORDER — METHYLPREDNISOLONE 4 MG PO TABS: ORAL_TABLET | ORAL | 0 refills | Status: DC

## 2019-03-15 MED ORDER — KETOROLAC TROMETHAMINE 30 MG/ML IJ SOLN: 30.0000 mg | Freq: Once | INTRAMUSCULAR | Status: AC
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
## 2019-03-21 ENCOUNTER — Telehealth: Payer: Self-pay | Admitting: Surgery

## 2019-03-21 NOTE — Telephone Encounter (Signed)
Please advise 

## 2019-03-21 NOTE — Telephone Encounter (Signed)
Patient called to let Jeneen Rinks know that she had a reaction to the Prednisone taper and that her back is still hurting.  CB#[phone].  Thank you.

## 2019-03-24 NOTE — Telephone Encounter (Signed)
Please call patient.  I sent you another message regarding this.  Thank you.

## 2019-03-24 NOTE — Telephone Encounter (Signed)
Please advise that my hands were somewhat tight at this point since Dr. Lorin Mercy is out of the clinic.  Recommend that she continue out of work until he returns to discuss possible surgical options or you can schedule her an appointment to see Dr. Louanne Skye next week and he can review the study and decide if something needs to be done before Dr. Lorin Mercy gets back.

## 2019-03-24 NOTE — Telephone Encounter (Signed)
Message sent to [facility] via My Chart. Awaiting response.

## 2019-03-29 ENCOUNTER — Encounter: Payer: Self-pay | Admitting: Radiology

## 2019-03-29 ENCOUNTER — Encounter: Payer: Self-pay | Admitting: Specialist

## 2019-03-29 ENCOUNTER — Ambulatory Visit: Payer: PRIVATE HEALTH INSURANCE | Admitting: Surgery

## 2019-03-29 ENCOUNTER — Ambulatory Visit (INDEPENDENT_AMBULATORY_CARE_PROVIDER_SITE_OTHER): Payer: PRIVATE HEALTH INSURANCE | Admitting: Specialist

## 2019-03-29 VITALS — BP 137/97 | HR 81 | Ht 66.0 in | Wt 209.0 lb

## 2019-03-29 DIAGNOSIS — M5126 Other intervertebral disc displacement, lumbar region: Secondary | ICD-10-CM

## 2019-03-29 DIAGNOSIS — Z9889 Other specified postprocedural states: Secondary | ICD-10-CM

## 2019-03-29 NOTE — Patient Instructions (Signed)
Avoid bending, stooping and avoid lifting weights greater than 10 lbs. Avoid prolong standing and walking. Order for a new walker with wheels. Surgery scheduling secretary Kandice Hams, will call you in the next week to schedule for surgery.  Surgery recommended is a one level lumbar L5-S1  this would be done with microscope. Take hydrocodone for for pain. Risk of surgery includes risk of infection 1 in 300 patients, bleeding 06/998% chance you would need a transfusion.   Risk to the nerves is one in 10,000.  Expect improved walking and standing tolerance. Expect relief of leg pain but numbness may persist depending on the length and degree of pressure that has been present.
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
## 2019-04-06 ENCOUNTER — Other Ambulatory Visit (HOSPITAL_COMMUNITY)
Admission: RE | Admit: 2019-04-06 | Discharge: 2019-04-06 | Disposition: A | Discharge status: Home / Self Care | Payer: PRIVATE HEALTH INSURANCE | Source: Ambulatory Visit | Attending: Specialist | Admitting: Specialist

## 2019-04-06 DIAGNOSIS — Z01812 Encounter for preprocedural laboratory examination: Secondary | ICD-10-CM | POA: Insufficient documentation

## 2019-04-06 DIAGNOSIS — Z20828 Contact with and (suspected) exposure to other viral communicable diseases: Secondary | ICD-10-CM | POA: Diagnosis not present

## 2019-04-07 ENCOUNTER — Encounter (HOSPITAL_COMMUNITY): Payer: Self-pay | Admitting: *Deleted

## 2019-04-07 ENCOUNTER — Other Ambulatory Visit: Payer: Self-pay

## 2019-04-07 LAB — NOVEL CORONAVIRUS, NAA (HOSP ORDER, SEND-OUT TO REF LAB; TAT 18-24 HRS): SARS-CoV-2, NAA: NOT DETECTED

## 2019-04-07 MED ORDER — BUPIVACAINE LIPOSOME 1.3 % IJ SUSP
20.0000 mL | Freq: Once | INTRAMUSCULAR | Status: DC
  Filled 2019-04-07: qty 20

## 2019-04-07 NOTE — Progress Notes (Signed)
Patient denies shortness of breath, fever, cough and chest pain.  PCP -  Lanelle Bal. PA Cardiologist - denits  Chest x-ray - denies EKG - 01/17/19 Stress Test - denies ECHO - denies Cardiac Cath - denies  ERAS: Clears til 4:30 DOS, no drink.  Anesthesia review: No  STOP now taking any Aspirin (unless otherwise instructed by your surgeon), Aleve, Naproxen, Ibuprofen, Motrin, Advil, Goody's, BC's, all herbal medications, fish oil, and all vitamins.   Coronavirus Screening Covid test on 04/06/19 was negative.  Patient verbalized understanding of instructions that were given via phone.

## 2019-04-09 NOTE — Anesthesia Preprocedure Evaluation (Addendum)
Anesthesia Evaluation  Patient identified by MRN, date of birth, ID band Patient awake    Reviewed: Allergy & Precautions, NPO status , Patient's Chart, lab work & pertinent test results  History of Anesthesia Complications (+) PONV and history of anesthetic complications  Airway Mallampati: I       Dental no notable dental hx. (+) Teeth Intact   Pulmonary former smoker,    Pulmonary exam normal breath sounds clear to auscultation       Cardiovascular hypertension, Normal cardiovascular exam Rhythm:Regular Rate:Normal     Neuro/Psych  Headaches, negative psych ROS   GI/Hepatic Neg liver ROS,   Endo/Other  negative endocrine ROS  Renal/GU negative Renal ROS     Musculoskeletal negative musculoskeletal ROS (+)   Abdominal (+) + obese,   Peds  Hematology negative hematology ROS (+)   Anesthesia Other Findings Day of surgery medications reviewed with the patient.  Reproductive/Obstetrics                            Anesthesia Physical  Anesthesia Plan  ASA: II  Anesthesia Plan: General   Post-op Pain Management:    Induction: Intravenous  PONV Risk Score and Plan: 4 or greater and Treatment may vary due to age or medical condition, Ondansetron, Dexamethasone, Midazolam, Scopolamine patch - Pre-op and Propofol infusion  Airway Management Planned: Oral ETT  Additional Equipment: None  Intra-op Plan:   Post-operative Plan: Extubation in OR  Informed Consent: I have reviewed the patients History and Physical, chart, labs and discussed the procedure including the risks, benefits and alternatives for the proposed anesthesia with the patient or authorized representative who has indicated his/her understanding and acceptance.     Dental advisory given  Plan Discussed with: CRNA  Anesthesia Plan Comments:        Anesthesia Quick Evaluation

## 2019-04-10 ENCOUNTER — Encounter (HOSPITAL_COMMUNITY): Payer: Self-pay | Admitting: Surgery

## 2019-04-10 ENCOUNTER — Ambulatory Visit (HOSPITAL_COMMUNITY): Payer: Self-pay | Admitting: Certified Registered Nurse Anesthetist

## 2019-04-10 ENCOUNTER — Inpatient Hospital Stay (HOSPITAL_COMMUNITY)
Admission: AD | Admit: 2019-04-10 | Discharge: 2019-04-11 | DRG: 520 | Disposition: A | Discharge status: Home Health | Payer: PRIVATE HEALTH INSURANCE | Attending: Specialist | Admitting: Specialist

## 2019-04-10 ENCOUNTER — Inpatient Hospital Stay (HOSPITAL_COMMUNITY)
Admission: AD | Disposition: A | Discharge status: Home Health | Payer: Self-pay | Source: Home / Self Care | Attending: Specialist

## 2019-04-10 ENCOUNTER — Other Ambulatory Visit: Payer: Self-pay

## 2019-04-10 ENCOUNTER — Ambulatory Visit (HOSPITAL_COMMUNITY): Payer: Self-pay

## 2019-04-10 DIAGNOSIS — K219 Gastro-esophageal reflux disease without esophagitis: Secondary | ICD-10-CM | POA: Diagnosis present

## 2019-04-10 DIAGNOSIS — M5126 Other intervertebral disc displacement, lumbar region: Secondary | ICD-10-CM | POA: Diagnosis not present

## 2019-04-10 DIAGNOSIS — Z91048 Other nonmedicinal substance allergy status: Secondary | ICD-10-CM

## 2019-04-10 DIAGNOSIS — Z09 Encounter for follow-up examination after completed treatment for conditions other than malignant neoplasm: Secondary | ICD-10-CM

## 2019-04-10 DIAGNOSIS — Z419 Encounter for procedure for purposes other than remedying health state, unspecified: Secondary | ICD-10-CM

## 2019-04-10 DIAGNOSIS — M5127 Other intervertebral disc displacement, lumbosacral region: Principal | ICD-10-CM | POA: Diagnosis present

## 2019-04-10 DIAGNOSIS — Z87891 Personal history of nicotine dependence: Secondary | ICD-10-CM

## 2019-04-10 DIAGNOSIS — Z886 Allergy status to analgesic agent status: Secondary | ICD-10-CM

## 2019-04-10 DIAGNOSIS — M5116 Intervertebral disc disorders with radiculopathy, lumbar region: Secondary | ICD-10-CM | POA: Diagnosis not present

## 2019-04-10 HISTORY — PX: LUMBAR LAMINECTOMY/DECOMPRESSION MICRODISCECTOMY: SHX5026

## 2019-04-10 HISTORY — DX: Other seasonal allergic rhinitis: J30.2

## 2019-04-10 HISTORY — DX: Gastro-esophageal reflux disease without esophagitis: K21.9

## 2019-04-10 LAB — CBC
HCT: 40.8 % (ref 36.0–46.0)
Hemoglobin: 13.2 g/dL (ref 12.0–15.0)
MCH: 30.1 pg (ref 26.0–34.0)
MCHC: 32.4 g/dL (ref 30.0–36.0)
MCV: 92.9 fL (ref 80.0–100.0)
Platelets: 296 10*3/uL (ref 150–400)
RBC: 4.39 MIL/uL (ref 3.87–5.11)
RDW: 12.1 % (ref 11.5–15.5)
WBC: 9.7 10*3/uL (ref 4.0–10.5)
nRBC: 0 % (ref 0.0–0.2)

## 2019-04-10 LAB — COMPREHENSIVE METABOLIC PANEL
ALT: 13 U/L (ref 0–44)
AST: 16 U/L (ref 15–41)
Albumin: 3.4 g/dL — ABNORMAL LOW (ref 3.5–5.0)
Alkaline Phosphatase: 74 U/L (ref 38–126)
Anion gap: 10 (ref 5–15)
BUN: 12 mg/dL (ref 6–20)
CO2: 20 mmol/L — ABNORMAL LOW (ref 22–32)
Calcium: 8.3 mg/dL — ABNORMAL LOW (ref 8.9–10.3)
Chloride: 107 mmol/L (ref 98–111)
Creatinine, Ser: 0.68 mg/dL (ref 0.44–1.00)
GFR calc Af Amer: >60 mL/min (ref >60)
GFR calc non Af Amer: >60 mL/min (ref >60)
Glucose, Bld: 109 mg/dL — ABNORMAL HIGH (ref 70–99)
Potassium: 3.8 mmol/L (ref 3.5–5.1)
Sodium: 137 mmol/L (ref 135–145)
Total Bilirubin: 0.5 mg/dL (ref 0.3–1.2)
Total Protein: 6.3 g/dL — ABNORMAL LOW (ref 6.5–8.1)

## 2019-04-10 SURGERY — LUMBAR LAMINECTOMY/DECOMPRESSION MICRODISCECTOMY
Anesthesia: General

## 2019-04-10 MED ORDER — 0.9 % SODIUM CHLORIDE (POUR BTL) OPTIME
TOPICAL | Status: DC | PRN
  Administered 2019-04-10: 1000 mL

## 2019-04-10 MED ORDER — SUCCINYLCHOLINE CHLORIDE 200 MG/10ML IV SOSY
PREFILLED_SYRINGE | INTRAVENOUS | Status: AC
  Filled 2019-04-10: qty 10

## 2019-04-10 MED ORDER — ACETAMINOPHEN 650 MG RE SUPP: 650.0000 mg | RECTAL | Status: DC | PRN

## 2019-04-10 MED ORDER — DEXMEDETOMIDINE HCL IN NACL 200 MCG/50ML IV SOLN
INTRAVENOUS | Status: DC | PRN
  Administered 2019-04-10: 20 ug via INTRAVENOUS

## 2019-04-10 MED ORDER — DEXAMETHASONE SODIUM PHOSPHATE 10 MG/ML IJ SOLN
INTRAMUSCULAR | Status: AC
  Filled 2019-04-10: qty 1

## 2019-04-10 MED ORDER — PROPOFOL 10 MG/ML IV BOLUS
INTRAVENOUS | Status: DC | PRN
  Administered 2019-04-10: 150 mg via INTRAVENOUS
  Administered 2019-04-10: 50 mg via INTRAVENOUS

## 2019-04-10 MED ORDER — GABAPENTIN 300 MG PO CAPS
300.0000 mg | ORAL_CAPSULE | Freq: Three times a day (TID) | ORAL | Status: DC
  Administered 2019-04-10 (×3): 300 mg via ORAL
  Filled 2019-04-10 (×3): qty 1

## 2019-04-10 MED ORDER — THROMBIN 20000 UNITS EX SOLR
CUTANEOUS | Status: DC | PRN
  Administered 2019-04-10: 20 mL via TOPICAL

## 2019-04-10 MED ORDER — LACTATED RINGERS IV SOLN
INTRAVENOUS | Status: DC | PRN
  Administered 2019-04-10 (×2): via INTRAVENOUS

## 2019-04-10 MED ORDER — PROPOFOL 10 MG/ML IV BOLUS
INTRAVENOUS | Status: AC
  Filled 2019-04-10: qty 20

## 2019-04-10 MED ORDER — LIDOCAINE 2% (20 MG/ML) 5 ML SYRINGE
INTRAMUSCULAR | Status: AC
  Filled 2019-04-10: qty 5

## 2019-04-10 MED ORDER — IBUPROFEN 200 MG PO TABS
600.0000 mg | ORAL_TABLET | Freq: Four times a day (QID) | ORAL | Status: DC
  Administered 2019-04-10 (×3): 600 mg via ORAL
  Filled 2019-04-10 (×3): qty 3

## 2019-04-10 MED ORDER — SODIUM CHLORIDE 0.9% FLUSH: 3.0000 mL | INTRAVENOUS | Status: DC | PRN

## 2019-04-10 MED ORDER — CHLORHEXIDINE GLUCONATE 4 % EX LIQD: 60.0000 mL | Freq: Once | CUTANEOUS | Status: DC

## 2019-04-10 MED ORDER — BUPIVACAINE HCL 0.5 % IJ SOLN
INTRAMUSCULAR | Status: DC | PRN
  Administered 2019-04-10 (×2): 5 mL

## 2019-04-10 MED ORDER — METHOCARBAMOL 500 MG PO TABS
ORAL_TABLET | ORAL | Status: AC
  Filled 2019-04-10: qty 1

## 2019-04-10 MED ORDER — ONDANSETRON HCL 4 MG PO TABS: 4.0000 mg | ORAL_TABLET | Freq: Four times a day (QID) | ORAL | Status: DC | PRN

## 2019-04-10 MED ORDER — CEFAZOLIN SODIUM-DEXTROSE 2-4 GM/100ML-% IV SOLN
2.0000 g | INTRAVENOUS | Status: AC
  Administered 2019-04-10: 08:00:00 2 g via INTRAVENOUS

## 2019-04-10 MED ORDER — LORATADINE 10 MG PO TABS
10.0000 mg | ORAL_TABLET | Freq: Every day | ORAL | Status: DC
  Filled 2019-04-10: qty 1

## 2019-04-10 MED ORDER — ACETAMINOPHEN 10 MG/ML IV SOLN: 1000.0000 mg | Freq: Once | INTRAVENOUS | Status: DC | PRN

## 2019-04-10 MED ORDER — FENTANYL CITRATE (PF) 250 MCG/5ML IJ SOLN
INTRAMUSCULAR | Status: AC
  Filled 2019-04-10: qty 5

## 2019-04-10 MED ORDER — BUPIVACAINE HCL (PF) 0.5 % IJ SOLN
INTRAMUSCULAR | Status: AC
  Filled 2019-04-10: qty 30

## 2019-04-10 MED ORDER — ONDANSETRON HCL 4 MG/2ML IJ SOLN
INTRAMUSCULAR | Status: DC | PRN
  Administered 2019-04-10: 4 mg via INTRAVENOUS

## 2019-04-10 MED ORDER — FLEET ENEMA 7-19 GM/118ML RE ENEM: 1.0000 | ENEMA | Freq: Once | RECTAL | Status: DC | PRN

## 2019-04-10 MED ORDER — BISACODYL 5 MG PO TBEC: 5.0000 mg | DELAYED_RELEASE_TABLET | Freq: Every day | ORAL | Status: DC | PRN

## 2019-04-10 MED ORDER — POLYVINYL ALCOHOL-POVIDONE PF 1.4-0.6 % OP SOLN: 1.0000 [drp] | Freq: Two times a day (BID) | OPHTHALMIC | Status: DC

## 2019-04-10 MED ORDER — SODIUM CHLORIDE 0.9 % IV SOLN: INTRAVENOUS | Status: DC

## 2019-04-10 MED ORDER — ONDANSETRON HCL 4 MG/2ML IJ SOLN
INTRAMUSCULAR | Status: AC
  Filled 2019-04-10: qty 2

## 2019-04-10 MED ORDER — HYDROCODONE-ACETAMINOPHEN 7.5-325 MG PO TABS
1.0000 | ORAL_TABLET | Freq: Four times a day (QID) | ORAL | Status: DC
  Administered 2019-04-10 – 2019-04-11 (×4): 1 via ORAL
  Filled 2019-04-10 (×4): qty 1

## 2019-04-10 MED ORDER — SCOPOLAMINE 1 MG/3DAYS TD PT72
MEDICATED_PATCH | TRANSDERMAL | Status: DC | PRN
  Administered 2019-04-10: 1 via TRANSDERMAL

## 2019-04-10 MED ORDER — DOCUSATE SODIUM 100 MG PO CAPS
100.0000 mg | ORAL_CAPSULE | Freq: Two times a day (BID) | ORAL | Status: DC
  Administered 2019-04-10 (×2): 100 mg via ORAL
  Filled 2019-04-10 (×2): qty 1

## 2019-04-10 MED ORDER — PHENOL 1.4 % MT LIQD: 1.0000 | OROMUCOSAL | Status: DC | PRN

## 2019-04-10 MED ORDER — THROMBIN (RECOMBINANT) 20000 UNITS EX SOLR
CUTANEOUS | Status: AC
  Filled 2019-04-10: qty 20000

## 2019-04-10 MED ORDER — MENTHOL 3 MG MT LOZG: 1.0000 | LOZENGE | OROMUCOSAL | Status: DC | PRN

## 2019-04-10 MED ORDER — SCOPOLAMINE 1 MG/3DAYS TD PT72
MEDICATED_PATCH | TRANSDERMAL | Status: AC
  Filled 2019-04-10: qty 1

## 2019-04-10 MED ORDER — PHENYLEPHRINE 40 MCG/ML (10ML) SYRINGE FOR IV PUSH (FOR BLOOD PRESSURE SUPPORT)
PREFILLED_SYRINGE | INTRAVENOUS | Status: AC
  Filled 2019-04-10: qty 10

## 2019-04-10 MED ORDER — POLYETHYLENE GLYCOL 3350 17 G PO PACK: 17.0000 g | PACK | Freq: Every day | ORAL | Status: DC | PRN

## 2019-04-10 MED ORDER — LIDOCAINE 2% (20 MG/ML) 5 ML SYRINGE
INTRAMUSCULAR | Status: DC | PRN
  Administered 2019-04-10: 60 mg via INTRAVENOUS

## 2019-04-10 MED ORDER — FAMOTIDINE 20 MG PO TABS
20.0000 mg | ORAL_TABLET | Freq: Two times a day (BID) | ORAL | Status: DC
  Administered 2019-04-10 (×2): 20 mg via ORAL
  Filled 2019-04-10 (×3): qty 1

## 2019-04-10 MED ORDER — BUPIVACAINE LIPOSOME 1.3 % IJ SUSP
INTRAMUSCULAR | Status: DC | PRN
  Administered 2019-04-10 (×2): 5 mL

## 2019-04-10 MED ORDER — ACETAMINOPHEN 325 MG PO TABS: 650.0000 mg | ORAL_TABLET | ORAL | Status: DC | PRN

## 2019-04-10 MED ORDER — MIDAZOLAM HCL 2 MG/2ML IJ SOLN
INTRAMUSCULAR | Status: AC
  Filled 2019-04-10: qty 2

## 2019-04-10 MED ORDER — METHOCARBAMOL 500 MG PO TABS
500.0000 mg | ORAL_TABLET | Freq: Four times a day (QID) | ORAL | Status: DC | PRN
  Administered 2019-04-10: 500 mg via ORAL

## 2019-04-10 MED ORDER — MEPERIDINE HCL 25 MG/ML IJ SOLN: 6.2500 mg | INTRAMUSCULAR | Status: DC | PRN

## 2019-04-10 MED ORDER — SUGAMMADEX SODIUM 200 MG/2ML IV SOLN
INTRAVENOUS | Status: DC | PRN
  Administered 2019-04-10: 200 mg via INTRAVENOUS

## 2019-04-10 MED ORDER — ALUM & MAG HYDROXIDE-SIMETH 200-200-20 MG/5ML PO SUSP: 30.0000 mL | Freq: Four times a day (QID) | ORAL | Status: DC | PRN

## 2019-04-10 MED ORDER — HYDROMORPHONE HCL 1 MG/ML IJ SOLN
0.2500 mg | INTRAMUSCULAR | Status: DC | PRN
  Administered 2019-04-10 (×2): 0.5 mg via INTRAVENOUS

## 2019-04-10 MED ORDER — MORPHINE SULFATE (PF) 2 MG/ML IV SOLN: 1.0000 mg | INTRAVENOUS | Status: DC | PRN

## 2019-04-10 MED ORDER — ONDANSETRON HCL 4 MG/2ML IJ SOLN: 4.0000 mg | Freq: Four times a day (QID) | INTRAMUSCULAR | Status: DC | PRN

## 2019-04-10 MED ORDER — PROMETHAZINE HCL 25 MG/ML IJ SOLN: 6.2500 mg | INTRAMUSCULAR | Status: DC | PRN

## 2019-04-10 MED ORDER — DEXAMETHASONE SODIUM PHOSPHATE 10 MG/ML IJ SOLN
INTRAMUSCULAR | Status: DC | PRN
  Administered 2019-04-10: 10 mg via INTRAVENOUS

## 2019-04-10 MED ORDER — CEFAZOLIN SODIUM-DEXTROSE 2-4 GM/100ML-% IV SOLN
2.0000 g | Freq: Three times a day (TID) | INTRAVENOUS | Status: AC
  Administered 2019-04-10 (×2): 2 g via INTRAVENOUS
  Filled 2019-04-10 (×2): qty 100

## 2019-04-10 MED ORDER — FENTANYL CITRATE (PF) 100 MCG/2ML IJ SOLN
INTRAMUSCULAR | Status: DC | PRN
  Administered 2019-04-10 (×2): 50 ug via INTRAVENOUS
  Administered 2019-04-10: 100 ug via INTRAVENOUS
  Administered 2019-04-10: 50 ug via INTRAVENOUS

## 2019-04-10 MED ORDER — KETOROLAC TROMETHAMINE 15 MG/ML IJ SOLN
15.0000 mg | Freq: Four times a day (QID) | INTRAMUSCULAR | Status: AC
  Administered 2019-04-10 – 2019-04-11 (×4): 15 mg via INTRAVENOUS
  Filled 2019-04-10 (×4): qty 1

## 2019-04-10 MED ORDER — HYDROCODONE-ACETAMINOPHEN 10-325 MG PO TABS: 1.0000 | ORAL_TABLET | ORAL | Status: DC | PRN

## 2019-04-10 MED ORDER — HYDROMORPHONE HCL 1 MG/ML IJ SOLN
INTRAMUSCULAR | Status: AC
  Filled 2019-04-10: qty 1

## 2019-04-10 MED ORDER — CEFAZOLIN SODIUM-DEXTROSE 2-4 GM/100ML-% IV SOLN
INTRAVENOUS | Status: AC
  Filled 2019-04-10: qty 100

## 2019-04-10 MED ORDER — MIDAZOLAM HCL 2 MG/2ML IJ SOLN
INTRAMUSCULAR | Status: DC | PRN
  Administered 2019-04-10: 2 mg via INTRAVENOUS

## 2019-04-10 MED ORDER — ROCURONIUM BROMIDE 50 MG/5ML IV SOSY
PREFILLED_SYRINGE | INTRAVENOUS | Status: DC | PRN
  Administered 2019-04-10: 30 mg via INTRAVENOUS
  Administered 2019-04-10: 100 mg via INTRAVENOUS

## 2019-04-10 MED ORDER — HYDROCODONE-ACETAMINOPHEN 10-325 MG PO TABS
2.0000 | ORAL_TABLET | ORAL | Status: DC | PRN
  Administered 2019-04-11: 2 via ORAL
  Filled 2019-04-10: qty 2

## 2019-04-10 MED ORDER — ROCURONIUM BROMIDE 10 MG/ML (PF) SYRINGE
PREFILLED_SYRINGE | INTRAVENOUS | Status: AC
  Filled 2019-04-10: qty 10

## 2019-04-10 MED ORDER — METHOCARBAMOL 1000 MG/10ML IJ SOLN
500.0000 mg | Freq: Four times a day (QID) | INTRAVENOUS | Status: DC | PRN
  Filled 2019-04-10: qty 5

## 2019-04-10 MED ORDER — SODIUM CHLORIDE 0.9% FLUSH
3.0000 mL | Freq: Two times a day (BID) | INTRAVENOUS | Status: DC
  Administered 2019-04-10: 22:00:00 3 mL via INTRAVENOUS

## 2019-04-10 MED ORDER — DEXMEDETOMIDINE HCL IN NACL 200 MCG/50ML IV SOLN
INTRAVENOUS | Status: AC
  Filled 2019-04-10: qty 50

## 2019-04-10 MED ORDER — POLYVINYL ALCOHOL 1.4 % OP SOLN
1.0000 [drp] | Freq: Two times a day (BID) | OPHTHALMIC | Status: DC
  Filled 2019-04-10: qty 15

## 2019-04-10 SURGICAL SUPPLY — 50 items
ADH SKN CLS APL DERMABOND .7 (GAUZE/BANDAGES/DRESSINGS) ×1
BUR SABER RD CUTTING 3.0 (BURR) ×1 IMPLANT
BUR SABER RD CUTTING 3.0MM (BURR) ×1
CANISTER SUCT 3000ML PPV (MISCELLANEOUS) ×3 IMPLANT
COVER SURGICAL LIGHT HANDLE (MISCELLANEOUS) ×3 IMPLANT
COVER WAND RF STERILE (DRAPES) ×3 IMPLANT
DERMABOND ADVANCED (GAUZE/BANDAGES/DRESSINGS) ×2
DERMABOND ADVANCED .7 DNX12 (GAUZE/BANDAGES/DRESSINGS) ×1 IMPLANT
DRAPE HALF SHEET 40X57 (DRAPES) ×2 IMPLANT
DRAPE INCISE IOBAN 66X45 STRL (DRAPES) IMPLANT
DRAPE MICROSCOPE LEICA (MISCELLANEOUS) ×3 IMPLANT
DRAPE SURG 17X23 STRL (DRAPES) ×12 IMPLANT
DRSG MEPILEX BORDER 4X4 (GAUZE/BANDAGES/DRESSINGS) ×2 IMPLANT
DRSG MEPILEX BORDER 4X8 (GAUZE/BANDAGES/DRESSINGS) IMPLANT
DURAPREP 26ML APPLICATOR (WOUND CARE) ×3 IMPLANT
ELECT REM PT RETURN 9FT ADLT (ELECTROSURGICAL) ×3
ELECTRODE REM PT RTRN 9FT ADLT (ELECTROSURGICAL) ×1 IMPLANT
EVACUATOR 1/8 PVC DRAIN (DRAIN) IMPLANT
GLOVE BIOGEL PI IND STRL 8 (GLOVE) ×1 IMPLANT
GLOVE BIOGEL PI INDICATOR 8 (GLOVE) ×2
GLOVE ECLIPSE 9.0 STRL (GLOVE) ×3 IMPLANT
GLOVE ORTHO TXT STRL SZ7.5 (GLOVE) ×3 IMPLANT
GLOVE SURG 8.5 LATEX PF (GLOVE) ×3 IMPLANT
GOWN STRL REUS W/ TWL LRG LVL3 (GOWN DISPOSABLE) ×1 IMPLANT
GOWN STRL REUS W/TWL 2XL LVL3 (GOWN DISPOSABLE) ×6 IMPLANT
GOWN STRL REUS W/TWL LRG LVL3 (GOWN DISPOSABLE) ×3
KIT BASIN OR (CUSTOM PROCEDURE TRAY) ×3 IMPLANT
KIT TURNOVER KIT B (KITS) ×3 IMPLANT
NDL SPNL 18GX3.5 QUINCKE PK (NEEDLE) ×2 IMPLANT
NEEDLE SPNL 18GX3.5 QUINCKE PK (NEEDLE) ×6 IMPLANT
NS IRRIG 1000ML POUR BTL (IV SOLUTION) ×3 IMPLANT
PACK LAMINECTOMY ORTHO (CUSTOM PROCEDURE TRAY) ×3 IMPLANT
PAD ARMBOARD 7.5X6 YLW CONV (MISCELLANEOUS) ×6 IMPLANT
PATTIES SURGICAL .5 X.5 (GAUZE/BANDAGES/DRESSINGS) IMPLANT
PATTIES SURGICAL .75X.75 (GAUZE/BANDAGES/DRESSINGS) IMPLANT
PATTIES SURGICAL 1X1 (DISPOSABLE) IMPLANT
SPONGE LAP 4X18 RFD (DISPOSABLE) ×2 IMPLANT
SPONGE SURGIFOAM ABS GEL 100 (HEMOSTASIS) ×2 IMPLANT
SUT VIC AB 0 CT1 27 (SUTURE)
SUT VIC AB 0 CT1 27XBRD ANBCTR (SUTURE) IMPLANT
SUT VIC AB 1 CT1 27 (SUTURE)
SUT VIC AB 1 CT1 27XBRD ANBCTR (SUTURE) IMPLANT
SUT VIC AB 2-0 CT1 27 (SUTURE)
SUT VIC AB 2-0 CT1 TAPERPNT 27 (SUTURE) IMPLANT
SUT VIC AB 3-0 X1 27 (SUTURE) ×3 IMPLANT
SUT VICRYL 0 UR6 27IN ABS (SUTURE) ×3 IMPLANT
TOWEL GREEN STERILE (TOWEL DISPOSABLE) ×3 IMPLANT
TOWEL GREEN STERILE FF (TOWEL DISPOSABLE) ×3 IMPLANT
TRAY FOLEY MTR SLVR 16FR STAT (SET/KITS/TRAYS/PACK) IMPLANT
WATER STERILE IRR 1000ML POUR (IV SOLUTION) ×3 IMPLANT

## 2019-04-10 NOTE — Brief Op Note (Signed)
04/10/2019  9:50 AM  PATIENT:  Tonya [name]  33 y.o. female  PRE-OPERATIVE DIAGNOSIS:  recurrent L5-S1 herniated disc  POST-OPERATIVE DIAGNOSIS:  recurrent L5-S1 herniated disc  PROCEDURE:  Procedure(s): LEFT L5-S1 REDO MICRODISCECTOMY (N/A)  SURGEON:  Surgeon(s) and Role:    * Jessy Oto, MD - Primary  PHYSICIAN ASSISTANT: Esaw Grandchild  ANESTHESIA:   local and general, Dr. Lyn Hollingshead  EBL:  50  BLOOD ADMINISTERED:none  DRAINS: none   LOCAL MEDICATIONS USED:  MARCAINE 0.%5 1:1 EXPAREL 1.3% Amount: 20 ml  SPECIMEN:  No Specimen  DISPOSITION OF SPECIMEN:  N/A  COUNTS:  YES  TOURNIQUET:  * No tourniquets in log *  DICTATION: .Dragon Dictation  PLAN OF CARE: Admit for overnight observation  PATIENT DISPOSITION:  PACU - hemodynamically stable.   Delay start of Pharmacological VTE agent (>24hrs) due to surgical blood loss or risk of bleeding: yes

## 2019-04-10 NOTE — Anesthesia Procedure Notes (Addendum)
Procedure Name: Intubation Date/Time: 04/10/2019 7:39 AM Performed by: Genelle Bal, CRNA Pre-anesthesia Checklist: Patient identified, Emergency Drugs available, Suction available and Patient being monitored Patient Re-evaluated:Patient Re-evaluated prior to induction Oxygen Delivery Method: Circle system utilized Preoxygenation: Pre-oxygenation with 100% oxygen Induction Type: IV induction Ventilation: Mask ventilation without difficulty Laryngoscope Size: Mac and 3 Grade View: Grade I Tube type: Oral Tube size: 7.0 mm Number of attempts: 1 Airway Equipment and Method: Stylet Placement Confirmation: ETT inserted through vocal cords under direct vision,  positive ETCO2 and breath sounds checked- equal and bilateral Secured at: 21 cm Tube secured with: Tape Dental Injury: Teeth and Oropharynx as per pre-operative assessment  Comments: Intubated by Orma Flaming, SRNA

## 2019-04-10 NOTE — Discharge Instructions (Signed)
    No lifting greater than 10 lbs. Avoid bending, stooping and twisting. Walk in house for first week them may start to get out slowly increasing distance up to one mile by 3 weeks post op. Keep incision dry for 3 days, may use tegaderm or similar water impervious dressing.  
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
## 2019-04-10 NOTE — Transfer of Care (Signed)
Immediate Anesthesia Transfer of Care Note  Patient: Tonya [name]  Procedure(s) Performed: LEFT L5-S1 REDO MICRODISCECTOMY (N/A )  Patient Location: PACU  Anesthesia Type:General  Level of Consciousness: awake, alert  and oriented  Airway & Oxygen Therapy: Patient Spontanous Breathing and Patient connected to face mask oxygen  Post-op Assessment: Report given to RN and Post -op Vital signs reviewed and stable  Post vital signs: Reviewed and stable  Last Vitals:  Vitals Value Taken Time  BP 149/95 04/10/19 1024  Temp    Pulse 86 04/10/19 1024  Resp 12 04/10/19 1024  SpO2 100 % 04/10/19 1024  Vitals shown include unvalidated device data.  Last Pain:  Vitals:   04/10/19 0613  TempSrc: Oral  PainSc:          Complications: No apparent anesthesia complications

## 2019-04-10 NOTE — Op Note (Signed)
04/10/2019  9:53 AM  PATIENT:  Tonya [name]  33 y.o. female  MRN: [id number]  OPERATIVE REPORT  PRE-OPERATIVE DIAGNOSIS:  recurrent L5-S1 herniated disc  POST-OPERATIVE DIAGNOSIS:  recurrent L5-S1 herniated disc  PROCEDURE:  Procedure(s): LEFT L5-S1 REDO MICRODISCECTOMY    SURGEON:  James E. Nitka, MD     ASSISTANT:  James Owens, PA-C  (Present throughout the entire procedure and necessary for completion of procedure in a timely manner)     ANESTHESIA:  General,    COMPLICATIONS:  None.     EBL:50CC  PROCEDURE:The patient was met in the holding area, and the appropriate left Lumbar level L5-S1 identified and marked with "x" and my initials.The patient was then transported to OR and was placed under general anesthesia without difficulty. The patient received appropriate preoperative antibiotic prophylaxis. The patient after intubation atraumatically was transferred to the operating room table, prone position, Wilson frame, sliding OR table. All pressure points were well padded. The arms in 90-90 well-padded at the elbows. Standard prep with DuraPrep solution lower dorsal spine to the mid sacral segment. Draped in the usual manner iodine Vi-Drape was used. Time-out procedure was called and correct. Cross table lateral radiograph with 2 spinal needles in place demonstrating the upper needle posterior to the left S1 pedicleThe skin incision scar at L5-S1 marked and was infiltrated with marcaine 0.5% 1:1 exparel 1.3% total of 20 cc used. An incision approximately an inch inch and a half in length was then made through skin and subcutaneous layers ellipsing the old skin incision in midline. An incision made into the left lumbosacral fascia approximately an inch in length along the left L5-S1 spinous processes. The paraspinal muscles elevated from the left side of the spinous processes and lamina of L5 and S1 Boss McCollough retractor then placed and a Penfield #4 placed at the inferior aspect  of the left L5-S1 facet a lateral radiograph identified the marker at the level of the L5-S1 facet. The operating room microscope sterilely draped brought into the field. Under the operating room microscope, the L5-S1 interspace carefully debrided the small amount of muscle attachment here and microcurettes used to define the previous laminotomy left L5-S1. A high-speed bur used to drill the medial aspect of the inferior articular process of L5. In order to medialize the laminotomy the left side of the L5 spinous process was thinned using a high speed bur. 2 mm Kerrison then used to enter the spinal canal over the medial aspect of the L5 inferior articular process carefully using the Kerrison to debris the attachment as a curet. Foraminotomy was then performed over the left S1 nerve root. The medial 10% superior articular process of S1 then resected using 2 mm Kerrison. This allowed for identification of the thecal sac. Penfield 4 was then used to carefully mobilize the thecal sac medially and the left S1 nerve root identified within the lateral recess flattened over the posterior aspect of the herniated disc. Carefully the lateral aspect of the S1 nerve root was identified and a Penfield 4 was used to mobilize the nerve medially such that the L5-S1 disc was visible with microscope. Using a Derricho for retraction and a Penfield #4 was used to define the rent in the posterior longitudinal ligament within the lateral recess on the left side longitudinally. Disc material s was removed using micropituitary rongeurs and nerve hook nerve root and then the thecal sac more easily able to be mobilized medially and retracted using a Derricho retractor. Further foraminotomies   was performed over the L5 nerve root the nerve root was noted to be exiting without further compression. The nerve root able to be retracted along the medial aspect of the L5 pedicle and disc material found to be subligamentous at this level was  further resected current pituitary rongeurs. Ligamentum flavum was further debrided superior to the level L5-S1 disc. Had a small amount of further resection of the L5 lamina inferiorly was performed. With this then the disc space at L5-S1 was easily visualized. Micropituitary was used to further debride this material superficially from the posterior aspect of the intervertebral disc is posterior lateral aspect of the disc. Small amount of further disc material was found subligamentous extending laterally from the disc this was removed using micropituitary rongeurs. Ligamentum flavum was debrided and lateral recess along the medial aspect L5-S1 facet no further decompression was necessary. Ball tip nerve probe was then able to carefully palpate the neuroforamen for L5 and S1 finding these to be well decompressed. Bleeding was then controlled using thrombin-soaked Gelfoam small cottonoids. Small amount of bleeding within the soft tissue mass the laminotomy area was controlled using bipolar electrocautery. Irrigation was carried out using copious amounts of irrigant solution. All Gelfoam were then removed. No significant active bleeding present at the time of removal. All instruments sponge counts were correct traction system was then carefully removed. Lumbodorsal fascia was then carefully approximated with interrupted 0 Vicryl sutures with a UR 6 needle, the deep subcutaneous layers were approximated with interrupted 0 Vicryl sutures on UR 6 the appear subcutaneous layers approximated with interrupted 2-0 Vicryl sutures and the skin closed with a running subcutaneous stitch of 4-0 Vicryl. Dermabond was applied allowed to dry and then Mepilex bandage applied. Patient was then carefully returned to supine position on a stretcher, reactivated and extubated. She was then returned to recovery room in satisfactory condition.  Benjiman Core, PA-C perform the duties of assistant surgeon during this case. He was present  from the beginning of the case to the end of the case assisting in transfer the patient from his stretcher to the OR table and back to the stretcher at the end of the case. Assisted in careful retraction and suction of the laminectomy site delicate neural structures operating under the operating room microscope. He also assisted with closure of the incision from the fascia to the skin applying the dressing.        Basil Dess  04/10/2019, 9:53 AM

## 2019-04-11 ENCOUNTER — Other Ambulatory Visit: Payer: Self-pay | Admitting: Specialist

## 2019-04-11 ENCOUNTER — Encounter: Payer: Self-pay | Admitting: Specialist

## 2019-04-11 ENCOUNTER — Encounter (HOSPITAL_COMMUNITY): Payer: Self-pay | Admitting: Specialist

## 2019-04-11 ENCOUNTER — Telehealth: Payer: Self-pay | Admitting: Specialist

## 2019-04-11 MED ORDER — GABAPENTIN 300 MG PO CAPS: 300.0000 mg | ORAL_CAPSULE | Freq: Every day | ORAL | 2 refills | Status: DC

## 2019-04-11 MED ORDER — METHOCARBAMOL 500 MG PO TABS: 500.0000 mg | ORAL_TABLET | Freq: Three times a day (TID) | ORAL | 0 refills | Status: DC | PRN

## 2019-04-11 MED ORDER — HYDROCODONE-ACETAMINOPHEN 7.5-325 MG PO TABS: 1.0000 | ORAL_TABLET | Freq: Four times a day (QID) | ORAL | 0 refills | Status: DC | PRN

## 2019-04-11 MED FILL — Thrombin (Recombinant) For Soln 20000 Unit: CUTANEOUS | Qty: 1 | Status: AC

## 2019-04-11 NOTE — Plan of Care (Signed)
Pt doing well. Pt given D/C instructions with verbal understanding. Rx's were sent to pharmacy by MD. Pt's IV was removed prior to D/C. Pt's incision is clean and dry with no sign of infection. Pt's IV was removed prior to D/C. Pt D/C'd home via wheelchair per MD order. Pt is stable @ D/C and has no other needs at this time. Holli Humbles, RN

## 2019-04-11 NOTE — Telephone Encounter (Signed)
Pt called in said dr.nitka wanted to put pt on a prescription for Gabapentin but never sent it in? Please check on that and have it sent to [facility].   [phone]

## 2019-04-11 NOTE — Telephone Encounter (Signed)
This has been taken care of pt is aware

## 2019-04-11 NOTE — Progress Notes (Signed)
     Subjective: 1 Day Post-Op Procedure(s) (LRB): LEFT L5-S1 REDO MICRODISCECTOMY (N/A) Awake, alert and oriented x 4. Voiding without difficulty, she has been able to stand and ambulate in room and hallway and she is ready to go. Pain is mild. Some soreness left thigh anteriorly.  Patient reports pain as mild.    Objective:   VITALS:  Temp:  [98 F (36.7 C)-98.3 F (36.8 C)] 98.1 F (36.7 C) (11/03 0409) Pulse Rate:  [70-89] 70 (11/03 0409) Resp:  [12-20] 20 (11/03 0409) BP: (103-149)/(57-95) 116/77 (11/03 0409) SpO2:  [93 %-100 %] 100 % (11/03 0409)  Neurologically intact ABD soft Neurovascular intact Sensation intact distally Intact pulses distally Dorsiflexion/Plantar flexion intact Incision: dressing C/D/I and scant drainage Compartment soft Able to toe and heel stand.    LABS Recent Labs    04/10/19 0544  HGB 13.2  WBC 9.7  PLT 296   Recent Labs    04/10/19 0544  NA 137  K 3.8  CL 107  CO2 20*  BUN 12  CREATININE 0.68  GLUCOSE 109*   No results for input(s): LABPT, INR in the last 72 hours.   Assessment/Plan: 1 Day Post-Op Procedure(s) (LRB): LEFT L5-S1 REDO MICRODISCECTOMY (N/A)  Advance diet Up with therapy D/C IV fluids Discharge home with home health  Basil Dess 04/11/2019, 7:46 AMPatient ID: Tonya [name], female   DOB: 21-Sep-1985, 33 y.o.   MRN: [id number]

## 2019-04-11 NOTE — Discharge Summary (Signed)
Physician Discharge Summary      Patient ID: Tonya [name] MRN: [id number] DOB/AGE: 33/11/1985 33 y.o.  Admit date: 04/10/2019 Discharge date: 04/11/2019  Admission Diagnoses:  Principal Problem:   Recurrent herniation of lumbar disc Active Problems:   Surgery follow-up   Discharge Diagnoses:  Same  Past Medical History:  Diagnosis Date  . Cystitis    Occas  . Dizziness   . Frequent UTI   . GERD (gastroesophageal reflux disease)   . Hand tingling    And feet  . Headache(784.0)   . Hypertension    no meds, patient reports this only while pregnant, not currently  . PONV (postoperative nausea and vomiting)   . Rapid heart rate    during pregnancy only  . Seasonal allergies     Surgeries: Procedure(s): LEFT L5-S1 REDO MICRODISCECTOMY on 04/10/2019   Consultants:   Discharged Condition: Improved  Hospital Course: JILLIENNE EGNER is an 33 y.o. female who was admitted 04/10/2019 with a chief complaint of No chief complaint on file. , and found to have a diagnosis of Recurrent herniation of lumbar disc.  She was brought to the operating room on 04/10/2019 and underwent the above named procedures.    She was given perioperative antibiotics:  Anti-infectives (From admission, onward)   Start     Dose/Rate Route Frequency Ordered Stop   04/10/19 1200  ceFAZolin (ANCEF) IVPB 2g/100 mL premix     2 g 200 mL/hr over 30 Minutes Intravenous Every 8 hours 04/10/19 1156 04/10/19 2014   04/10/19 0600  ceFAZolin (ANCEF) IVPB 2g/100 mL premix     2 g 200 mL/hr over 30 Minutes Intravenous On call to O.R. 04/10/19 0543 04/10/19 0814   04/10/19 0551  ceFAZolin (ANCEF) 2-4 GM/100ML-% IVPB    Note to Pharmacy: Laurita Quint   : cabinet override      04/10/19 0551 04/10/19 0744    Recovered uneventfully in the PACU and was transferred to [address] [facility] Room 7, VSS, no complaints stood at bedside that evening and was able to void without difficulty. Left leg pain was relieve and her legs NV  normal. POD#1 awake, alert and oriented x 4. Dressing changed without difficulty and she demonstrated normal heel and toe standing. VSS, she was tolerating po meds and nourishment. She was discharged home on POD#1.   She was given sequential compression devices, early ambulation, and chemoprophylaxis for DVT prophylaxis.  She benefited maximally from their hospital stay and there were no complications.    Recent vital signs:  Vitals:   04/11/19 0409 04/11/19 0747  BP: 116/77 118/79  Pulse: 70 72  Resp: 20 18  Temp: 98.1 F (36.7 C) 97.7 F (36.5 C)  SpO2: 100% 100%    Recent laboratory studies:  Results for orders placed or performed during the hospital encounter of 04/10/19  CBC  Result Value Ref Range   WBC 9.7 4.0 - 10.5 K/uL   RBC 4.39 3.87 - 5.11 MIL/uL   Hemoglobin 13.2 12.0 - 15.0 g/dL   HCT 40.8 36.0 - 46.0 %   MCV 92.9 80.0 - 100.0 fL   MCH 30.1 26.0 - 34.0 pg   MCHC 32.4 30.0 - 36.0 g/dL   RDW 12.1 11.5 - 15.5 %   Platelets 296 150 - 400 K/uL   nRBC 0.0 0.0 - 0.2 %  Comprehensive metabolic panel  Result Value Ref Range   Sodium 137 135 - 145 mmol/L   Potassium 3.8 3.5 - 5.1 mmol/L  Chloride 107 98 - 111 mmol/L   CO2 20 (L) 22 - 32 mmol/L   Glucose, Bld 109 (H) 70 - 99 mg/dL   BUN 12 6 - 20 mg/dL   Creatinine, Ser 6.57 0.44 - 1.00 mg/dL   Calcium 8.3 (L) 8.9 - 10.3 mg/dL   Total Protein 6.3 (L) 6.5 - 8.1 g/dL   Albumin 3.4 (L) 3.5 - 5.0 g/dL   AST 16 15 - 41 U/L   ALT 13 0 - 44 U/L   Alkaline Phosphatase 74 38 - 126 U/L   Total Bilirubin 0.5 0.3 - 1.2 mg/dL   GFR calc non Af Amer >60 >60 mL/min   GFR calc Af Amer >60 >60 mL/min   Anion gap 10 5 - 15    Discharge Medications:   Allergies as of 04/11/2019      Reactions   Green Dyes Hives   Ibuprofen Other (See Comments)   Esophagitis and chest pain - pt states that its ok to take with heartburn medication      Medication List    STOP taking these medications   methylPREDNISolone 4 MG tablet  Commonly known as: Medrol   oxyCODONE-acetaminophen 5-325 MG tablet Commonly known as: Percocet     TAKE these medications   cetirizine 10 MG tablet Commonly known as: ZYRTEC Take 10 mg by mouth daily.   HYDROcodone-acetaminophen 7.5-325 MG tablet Commonly known as: NORCO Take 1-2 tablets by mouth every 6 (six) hours as needed for moderate pain. What changed:   how much to take  Another medication with the same name was removed. Continue taking this medication, and follow the directions you see here.   ibuprofen 200 MG tablet Commonly known as: ADVIL Take 600-800 mg by mouth every 6 (six) hours as needed for headache.   methocarbamol 500 MG tablet Commonly known as: ROBAXIN Take 1 tablet (500 mg total) by mouth every 8 (eight) hours as needed for muscle spasms. What changed: when to take this   ranitidine 150 MG capsule Commonly known as: ZANTAC Take 150 mg by mouth daily as needed for heartburn (when taking ibu).   REFRESH OP Place 1 drop into both eyes daily as needed (dry eyes).       Diagnostic Studies: Dg Lumbar Spine 2-3 Views  Result Date: 04/10/2019 CLINICAL DATA:  L5-S1 micro discectomy. EXAM: LUMBAR SPINE - 2-3 VIEW COMPARISON:  MRI 03/14/2019 FINDINGS: Lateral portable radiograph of the lumbar spine. Lumbar spine levels have been annotated. On the first image there is a surgical probe posterior to the L5-S1 disc space. On the subsequent image there are tissue spreaders posterior to L5-S1 with a surgical probe directed towards the S1 vertebra. IMPRESSION: 1. Portable lateral radiograph of the lumbar spine used for localization of the L5-S1 disc space. Electronically Signed   By: Signa Kell M.D.   On: 04/10/2019 10:24   Mr Lumbar Spine W Wo Contrast  Result Date: 03/14/2019 CLINICAL DATA:  Persistent low back pain radiating into the left leg for 1 week. No known injury. History of back surgery approximately 6 weeks ago. EXAM: MRI LUMBAR SPINE WITHOUT AND  WITH CONTRAST TECHNIQUE: Multiplanar and multiecho pulse sequences of the lumbar spine were obtained without and with intravenous contrast. CONTRAST:  73mL GADAVIST GADOBUTROL 1 MMOL/ML IV SOLN COMPARISON:  Radiographs 01/25/2019. MRI 12/07/2018. FINDINGS: Segmentation: Conventional anatomy assumed, with the last open disc space designated L5-S1.Concordant with previous imaging. Alignment: Normal. Vertebrae: No worrisome osseous lesion, acute fracture or pars defect.  The visualized sacroiliac joints appear unremarkable. Conus medullaris: Extends to the L1 level and appears normal. No abnormal intradural enhancement. Paraspinal and other soft tissues: Interval extension of the left laminectomy at L5-S1 with associated enhancing granulation tissue. No focal fluid collection. No other paraspinal abnormality. Disc levels: The discs remain well hydrated with preserved height from T11-12 through L4-5. There is no spinal stenosis or nerve root encroachment. L5-S1: Interval extension of the left-sided laminectomy with partial facetectomy. There is enhancing granulation tissue within the lateral recess, surrounding the left S1 nerve root. A left paracentral disc protrusion has not significantly enlarged. Both foramina are patent without exiting L4 nerve root encroachment. IMPRESSION: 1. Interval extension of the left-sided laminectomy and partial facetectomy at L5-S1. There is enhancing granulation tissue within the lateral recess which may contribute to left S1 nerve root irritation. Underlying left paracentral disc protrusion has not significantly changed. 2. The additional disc space levels appear normal. Electronically Signed   By: Carey BullocksWilliam  Veazey M.D.   On: 03/14/2019 16:52    Disposition: Discharge disposition: 01-Home or Self Care       Discharge Instructions    Call MD / Call 911   Complete by: As directed    If you experience chest pain or shortness of breath, CALL 911 and be transported to the hospital  emergency room.  If you develope a fever above 101 F, pus (white drainage) or increased drainage or redness at the wound, or calf pain, call your surgeon's office.   Constipation Prevention   Complete by: As directed    Drink plenty of fluids.  Prune juice may be helpful.  You may use a stool softener, such as Colace (over the counter) 100 mg twice a day.  Use MiraLax (over the counter) for constipation as needed.   Diet - low sodium heart healthy   Complete by: As directed    Discharge instructions   Complete by: As directed    No lifting greater than 10 lbs. Avoid bending, stooping and twisting. Walk in house for first week them may start to get out slowly increasing distance up to one mile by 3 weeks post op. Keep incision dry for 3 days, may use tegaderm or similar water impervious dressing.   Driving restrictions   Complete by: As directed    No driving for 2 weeks   Increase activity slowly as tolerated   Complete by: As directed    Lifting restrictions   Complete by: As directed    No lifting for 8 weeks      Follow-up Information    Kerrin ChampagneNitka, James E, MD In 2 weeks.   Specialty: Orthopedic Surgery Why: For wound re-check Contact information: [address] [place] [address] [phone]            Signed: Vira BrownsJames Nitka 04/11/2019, 7:53 AM
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
## 2019-04-13 NOTE — Anesthesia Postprocedure Evaluation (Signed)
Anesthesia Post Note  Patient: Tonya [name]  Procedure(s) Performed: LEFT L5-S1 REDO MICRODISCECTOMY (N/A )     Patient location during evaluation: PACU Anesthesia Type: General Level of consciousness: awake and sedated Pain management: pain level controlled Vital Signs Assessment: post-procedure vital signs reviewed and stable Respiratory status: spontaneous breathing Cardiovascular status: stable Postop Assessment: no apparent nausea or vomiting Anesthetic complications: no    Last Vitals:  Vitals:   04/11/19 0409 04/11/19 0747  BP: 116/77 118/79  Pulse: 70 72  Resp: 20 18  Temp: 36.7 C 36.5 C  SpO2: 100% 100%    Last Pain:  Vitals:   04/11/19 0955  TempSrc:   PainSc: 4    Pain Goal: Patients Stated Pain Goal: 3 (04/11/19 0920)                 Huston Foley

## 2019-04-17 ENCOUNTER — Encounter: Payer: Self-pay | Admitting: Specialist

## 2019-04-17 NOTE — Telephone Encounter (Signed)
Please advise patient that I think it is normal swelling.  We will look at it when she has follow-up visit.

## 2019-04-26 ENCOUNTER — Other Ambulatory Visit: Payer: Self-pay

## 2019-04-26 ENCOUNTER — Ambulatory Visit (INDEPENDENT_AMBULATORY_CARE_PROVIDER_SITE_OTHER): Payer: PRIVATE HEALTH INSURANCE | Admitting: Specialist

## 2019-04-26 ENCOUNTER — Encounter: Payer: Self-pay | Admitting: Specialist

## 2019-04-26 VITALS — BP 122/83 | HR 89 | Ht 66.0 in | Wt 209.0 lb

## 2019-04-26 DIAGNOSIS — Z9889 Other specified postprocedural states: Secondary | ICD-10-CM

## 2019-04-26 NOTE — Patient Instructions (Addendum)
    No lifting greater than 10 lbs. Avoid bending, stooping and twisting. Slowly increasing distance up to one mile by 3 weeks post op. Vit E capsules rubbed in scar daily. Therapy at [facility]. Gabapentin at night for pain Ibuprofen 800 mg up to three times a day.

## 2019-04-26 NOTE — Progress Notes (Signed)
Post-Op Visit Note   Patient: Tonya [name]           Date of Birth: [birth year]           MRN: [id number] Visit Date: 04/26/2019 PCP: Lanelle Bal, PA-C   Assessment & Plan:2 weeks Post redo microdiscectomy  Chief Complaint:  Chief Complaint  Patient presents with  . Lower Back - Routine Post Op  Incision with no drainage, no erythrema, non tender. Legs NV normal SLR to 80 degrees. Reflexes Ankle 2+/2+ Knee 2+/2+ Visit Diagnoses: No diagnosis found.  Plan: No lifting greater than 10 lbs. Avoid bending, stooping and twisting. Slowly increasing distance up to one mile by 3 weeks post op. Vit E capsules rubbed in scar daily. Therapy at [facility].  Follow-Up Instructions: Return in about 4 weeks (around 05/24/2019).   Orders:  No orders of the defined types were placed in this encounter.  No orders of the defined types were placed in this encounter.   Imaging: No results found.  PMFS History: Patient Active Problem List   Diagnosis Date Noted  . Recurrent herniation of lumbar disc 04/10/2019    Priority: High    Class: Acute  . Surgery follow-up 04/10/2019  . HNP (herniated nucleus pulposus), lumbar 01/25/2019  . Protrusion of lumbar intervertebral disc 12/13/2018  . Menorrhagia 03/18/2017  . Status post induction of labor 05/07/2014  . SVD (spontaneous vaginal delivery) 05/07/2014  . Near syncope 01/15/2014  . Pregnancy 01/15/2014  . URI 04/01/2010  . COMMON MIGRAINE 12/17/2006  . HYPERTENSION 12/17/2006   Past Medical History:  Diagnosis Date  . Cystitis    Occas  . Dizziness   . Frequent UTI   . GERD (gastroesophageal reflux disease)   . Hand tingling    And feet  . Headache(784.0)   . Hypertension    no meds, patient reports this only while pregnant, not currently  . PONV (postoperative nausea and vomiting)   . Rapid heart rate    during pregnancy only  . Seasonal allergies     Family History  Problem Relation Age of Onset  . Diabetes  Paternal Uncle   . Hypertension Paternal Grandmother   . Obesity Other   . Sleep apnea Other   . Hypertension Maternal Grandmother   . Hypertension Maternal Grandfather   . Diabetes Maternal Grandfather   . Stroke Maternal Grandfather   . Hypertension Paternal Grandfather   . Thrombophlebitis Paternal Grandfather   . Hyperlipidemia Paternal Grandfather     Past Surgical History:  Procedure Laterality Date  . ABDOMINAL HYSTERECTOMY    . BACK SURGERY  2012  . BREAST LUMPECTOMY Left    Benign  . LAPAROSCOPIC VAGINAL HYSTERECTOMY WITH SALPINGECTOMY Bilateral 03/18/2017   Procedure: LAPAROSCOPIC ASSISTED VAGINAL HYSTERECTOMY WITH SALPINGECTOMY;  Surgeon: Marylynn Pearson, MD;  Location: [place];  Service: Gynecology;  Laterality: Bilateral;  NEEDS BED  . LUMBAR LAMINECTOMY Left 01/25/2019   Procedure: LEFT LUMBAR FIVE TO SACRAL ONE, RECURRENT MICRODISCECTOMY;  Surgeon: Marybelle Killings, MD;  Location: [place];  Service: Orthopedics;  Laterality: Left;  . LUMBAR LAMINECTOMY/DECOMPRESSION MICRODISCECTOMY N/A 04/10/2019   Procedure: LEFT L5-S1 REDO MICRODISCECTOMY;  Surgeon: Jessy Oto, MD;  Location: [place];  Service: Orthopedics;  Laterality: N/A;  . SHOULDER SURGERY  2008, 2009   L shoulder, acromion shave and recurrent dislocation.  Marland Kitchen SPINE SURGERY  03/06/11   lumbar disc L5  . TUBAL LIGATION Bilateral 05/07/2014   Procedure: POST PARTUM TUBAL LIGATION;  Surgeon: Zelphia Cairo, MD;  Location: [facility];  Service: Gynecology;  Laterality: Bilateral;  . VAGINAL DELIVERY  2014, 2015  . WISDOM TOOTH EXTRACTION     Social History   Occupational History  . Not on file  Tobacco Use  . Smoking status: Former Smoker    Types: Cigarettes    Quit date: 2015    Years since quitting: 5.8  . Smokeless tobacco: Never Used  Substance and Sexual Activity  . Alcohol use: Yes    Alcohol/week: 0.0 standard drinks    Comment: occas  . Drug use: No  . Sexual activity: Not Currently     Birth control/protection: None    Comment: Hysterectomy

## 2019-05-09 ENCOUNTER — Ambulatory Visit (HOSPITAL_COMMUNITY): Payer: PRIVATE HEALTH INSURANCE | Admitting: Physical Therapy

## 2019-05-31 ENCOUNTER — Encounter: Payer: Self-pay | Admitting: Specialist

## 2019-05-31 ENCOUNTER — Other Ambulatory Visit: Payer: Self-pay

## 2019-05-31 ENCOUNTER — Ambulatory Visit (INDEPENDENT_AMBULATORY_CARE_PROVIDER_SITE_OTHER): Payer: PRIVATE HEALTH INSURANCE | Admitting: Specialist

## 2019-05-31 VITALS — BP 146/102 | HR 103 | Ht 66.0 in | Wt 209.0 lb

## 2019-05-31 DIAGNOSIS — Z9889 Other specified postprocedural states: Secondary | ICD-10-CM

## 2019-05-31 NOTE — Patient Instructions (Signed)
Avoid frequent bending and stooping  No lifting greater than 10 lbs. May use ice or moist heat for pain. Weight loss is of benefit. Best medication for lumbar disc disease is arthritis medications like motrin, celebrex and naprosyn. Exercise is important to improve your indurance and does allow people to function better inspite of back pain.   

## 2019-05-31 NOTE — Progress Notes (Signed)
Post-Op Visit Note   Patient: Tonya [name]           Date of Birth: 10/24/1985           MRN: [id number] Visit Date: 05/31/2019 PCP: Lanelle Bal, PA-C   Assessment & Plan:7 weeks post op redo microdiscectomy L4-5 Doing well.  Chief Complaint:  Chief Complaint  Patient presents with  . Lower Back - Routine Post Op  Awake , alert and oriented x 4. Heel and toe walking Forward bends nearly to the floor. Incision is healed  Visit Diagnoses: No diagnosis found.  Plan: Avoid frequent bending and stooping  No lifting greater than 10 lbs. May use ice or moist heat for pain. Weight loss is of benefit. Best medication for lumbar disc disease is arthritis medications like motrin, celebrex and naprosyn. Exercise is important to improve your indurance and does allow people to function better inspite of back pain.    Follow-Up Instructions: No follow-ups on file.   Orders:  No orders of the defined types were placed in this encounter.  No orders of the defined types were placed in this encounter.   Imaging: No results found.  PMFS History: Patient Active Problem List   Diagnosis Date Noted  . Recurrent herniation of lumbar disc 04/10/2019    Priority: High    Class: Acute  . Surgery follow-up 04/10/2019  . HNP (herniated nucleus pulposus), lumbar 01/25/2019  . Protrusion of lumbar intervertebral disc 12/13/2018  . Menorrhagia 03/18/2017  . Status post induction of labor 05/07/2014  . SVD (spontaneous vaginal delivery) 05/07/2014  . Near syncope 01/15/2014  . Pregnancy 01/15/2014  . URI 04/01/2010  . COMMON MIGRAINE 12/17/2006  . HYPERTENSION 12/17/2006   Past Medical History:  Diagnosis Date  . Cystitis    Occas  . Dizziness   . Frequent UTI   . GERD (gastroesophageal reflux disease)   . Hand tingling    And feet  . Headache(784.0)   . Hypertension    no meds, patient reports this only while pregnant, not currently  . PONV (postoperative nausea and  vomiting)   . Rapid heart rate    during pregnancy only  . Seasonal allergies     Family History  Problem Relation Age of Onset  . Diabetes Paternal Uncle   . Hypertension Paternal Grandmother   . Obesity Other   . Sleep apnea Other   . Hypertension Maternal Grandmother   . Hypertension Maternal Grandfather   . Diabetes Maternal Grandfather   . Stroke Maternal Grandfather   . Hypertension Paternal Grandfather   . Thrombophlebitis Paternal Grandfather   . Hyperlipidemia Paternal Grandfather     Past Surgical History:  Procedure Laterality Date  . ABDOMINAL HYSTERECTOMY    . BACK SURGERY  2012  . BREAST LUMPECTOMY Left    Benign  . LAPAROSCOPIC VAGINAL HYSTERECTOMY WITH SALPINGECTOMY Bilateral 03/18/2017   Procedure: LAPAROSCOPIC ASSISTED VAGINAL HYSTERECTOMY WITH SALPINGECTOMY;  Surgeon: Marylynn Pearson, MD;  Location: [place];  Service: Gynecology;  Laterality: Bilateral;  NEEDS BED  . LUMBAR LAMINECTOMY Left 01/25/2019   Procedure: LEFT LUMBAR FIVE TO SACRAL ONE, RECURRENT MICRODISCECTOMY;  Surgeon: Marybelle Killings, MD;  Location: [place];  Service: Orthopedics;  Laterality: Left;  . LUMBAR LAMINECTOMY/DECOMPRESSION MICRODISCECTOMY N/A 04/10/2019   Procedure: LEFT L5-S1 REDO MICRODISCECTOMY;  Surgeon: Jessy Oto, MD;  Location: [place];  Service: Orthopedics;  Laterality: N/A;  . SHOULDER SURGERY  2008, 2009   L shoulder, acromion shave  and recurrent dislocation.  Marland Kitchen SPINE SURGERY  03/06/11   lumbar disc L5  . TUBAL LIGATION Bilateral 05/07/2014   Procedure: POST PARTUM TUBAL LIGATION;  Surgeon: Zelphia Cairo, MD;  Location: [facility];  Service: Gynecology;  Laterality: Bilateral;  . VAGINAL DELIVERY  2014, 2015  . WISDOM TOOTH EXTRACTION     Social History   Occupational History  . Not on file  Tobacco Use  . Smoking status: Former Smoker    Types: Cigarettes    Quit date: 2015    Years since quitting: 5.9  . Smokeless tobacco: Never Used  Substance  and Sexual Activity  . Alcohol use: Yes    Alcohol/week: 0.0 standard drinks    Comment: occas  . Drug use: No  . Sexual activity: Not Currently    Birth control/protection: None    Comment: Hysterectomy

## 2019-07-12 ENCOUNTER — Ambulatory Visit (INDEPENDENT_AMBULATORY_CARE_PROVIDER_SITE_OTHER): Payer: PRIVATE HEALTH INSURANCE | Admitting: Specialist

## 2019-07-12 ENCOUNTER — Other Ambulatory Visit: Payer: Self-pay

## 2019-07-12 ENCOUNTER — Encounter: Payer: Self-pay | Admitting: Specialist

## 2019-07-12 VITALS — BP 135/91 | HR 73 | Ht 66.0 in | Wt 209.0 lb

## 2019-07-12 DIAGNOSIS — Z9889 Other specified postprocedural states: Secondary | ICD-10-CM
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
## 2019-07-18 ENCOUNTER — Ambulatory Visit: Payer: PRIVATE HEALTH INSURANCE | Attending: Internal Medicine

## 2019-07-18 ENCOUNTER — Other Ambulatory Visit: Payer: Self-pay

## 2019-07-18 DIAGNOSIS — Z20822 Contact with and (suspected) exposure to covid-19: Secondary | ICD-10-CM

## 2019-07-19 LAB — NOVEL CORONAVIRUS, NAA: SARS-CoV-2, NAA: NOT DETECTED

## 2020-01-10 ENCOUNTER — Ambulatory Visit (INDEPENDENT_AMBULATORY_CARE_PROVIDER_SITE_OTHER): Payer: PRIVATE HEALTH INSURANCE

## 2020-01-10 ENCOUNTER — Ambulatory Visit (INDEPENDENT_AMBULATORY_CARE_PROVIDER_SITE_OTHER): Payer: PRIVATE HEALTH INSURANCE | Admitting: Orthopaedic Surgery

## 2020-01-10 ENCOUNTER — Encounter: Payer: Self-pay | Admitting: Orthopaedic Surgery

## 2020-01-10 VITALS — BP 153/108 | HR 79 | Ht 66.0 in | Wt 198.0 lb

## 2020-01-10 DIAGNOSIS — G8929 Other chronic pain: Secondary | ICD-10-CM

## 2020-01-10 DIAGNOSIS — M4807 Spinal stenosis, lumbosacral region: Secondary | ICD-10-CM

## 2020-01-10 DIAGNOSIS — M5442 Lumbago with sciatica, left side: Secondary | ICD-10-CM

## 2020-01-10 DIAGNOSIS — Z9889 Other specified postprocedural states: Secondary | ICD-10-CM

## 2020-01-10 NOTE — Progress Notes (Signed)
Office Visit Note   Patient: Tonya [name]           Date of Birth: 1985-11-03           MRN: [id number] Visit Date: 01/10/2020              Requested by: Lianne Moris, PA-C [address] [place],  [place] [address] PCP: Lianne Moris, PA-C   Assessment & Plan: Visit Diagnoses:  1. S/P lumbar microdiscectomy   2. Chronic left-sided low back pain with left-sided sciatica     Plan: We will proceed with MRI scan with and without contrast at [facility] rule out disc recurrence.  Office follow-up after scan for review.  Follow-Up Instructions: Office follow-up after lumbar MRI scan.  Orders:  Orders Placed This Encounter  Procedures  . XR Lumbar Spine Complete   No orders of the defined types were placed in this encounter.     Procedures: No procedures performed   Clinical Data: No additional findings.   Subjective: Chief Complaint  Patient presents with  . Lower Back - Pain    HPI 34 year old female returns post microdiscectomy 01/25/2019 left L5-S1 then recurrent procedure 04/10/2019 for disc recurrence.  Patient states she has done well since that time until 2 months ago and started having back pain bilateral leg pain worse on the left than right leg and states it is her disc bothering her again.  She woke up at night from sleep she has throbbing shooting pain states her legs been cramping for 2 weeks she is taken hydrocodone 1 time since she had some was residual tablets.  She is tried Tylenol and ibuprofen without relief she has difficulty sitting pain is been constant she has been trying to stand some at work.  Patient states she needs an MRI scan.  Review of Systems No fever or chills.  No change in past problems including history of migraines in the past also hypertension.  All other 14 point systems are negative as pertains HPI.  Objective: Vital Signs: BP (!) 153/108 (BP Location: Left Arm, Patient Position: Sitting, Cuff Size: Normal)   Pulse 79   Ht 5'  6" (1.676 m)   Wt 198 lb (89.8 kg)   LMP 03/02/2017 (LMP Unknown)   BMI 31.96 kg/m   Physical Exam Constitutional:      Appearance: She is well-developed.  HENT:     Head: Normocephalic.     Right Ear: External ear normal.     Left Ear: External ear normal.  Eyes:     Pupils: Pupils are equal, round, and reactive to light.  Neck:     Thyroid: No thyromegaly.     Trachea: No tracheal deviation.  Cardiovascular:     Rate and Rhythm: Normal rate.  Pulmonary:     Effort: Pulmonary effort is normal.  Abdominal:     Palpations: Abdomen is soft.  Skin:    General: Skin is warm and dry.  Neurological:     Mental Status: She is alert and oriented to person, place, and time.  Psychiatric:        Behavior: Behavior normal.     Ortho Exam patient has pain with straight leg raising at 70 degrees on the left.  Positive sciatic notch tenderness.  Lumbar incisions well-healed.  No sciatic notch tenderness on the right negative logroll hips.  Negative straight leg raising on the right.  Negative logroll the hips.  Knee and ankle jerk are intact distal  pulses are 2+.  Specialty Comments:  No specialty comments available.  Imaging: No results found.   PMFS History: Patient Active Problem List   Diagnosis Date Noted  . Recurrent herniation of lumbar disc 04/10/2019    Class: Acute  . Surgery follow-up 04/10/2019  . HNP (herniated nucleus pulposus), lumbar 01/25/2019  . Protrusion of lumbar intervertebral disc 12/13/2018  . Menorrhagia 03/18/2017  . Status post induction of labor 05/07/2014  . SVD (spontaneous vaginal delivery) 05/07/2014  . Near syncope 01/15/2014  . Pregnancy 01/15/2014  . URI 04/01/2010  . COMMON MIGRAINE 12/17/2006  . HYPERTENSION 12/17/2006   Past Medical History:  Diagnosis Date  . Cystitis    Occas  . Dizziness   . Frequent UTI   . GERD (gastroesophageal reflux disease)   . Hand tingling    And feet  . Headache(784.0)   . Hypertension    no  meds, patient reports this only while pregnant, not currently  . PONV (postoperative nausea and vomiting)   . Rapid heart rate    during pregnancy only  . Seasonal allergies     Family History  Problem Relation Age of Onset  . Diabetes Paternal Uncle   . Hypertension Paternal Grandmother   . Obesity Other   . Sleep apnea Other   . Hypertension Maternal Grandmother   . Hypertension Maternal Grandfather   . Diabetes Maternal Grandfather   . Stroke Maternal Grandfather   . Hypertension Paternal Grandfather   . Thrombophlebitis Paternal Grandfather   . Hyperlipidemia Paternal Grandfather     Past Surgical History:  Procedure Laterality Date  . ABDOMINAL HYSTERECTOMY    . BACK SURGERY  2012  . BREAST LUMPECTOMY Left    Benign  . LAPAROSCOPIC VAGINAL HYSTERECTOMY WITH SALPINGECTOMY Bilateral 03/18/2017   Procedure: LAPAROSCOPIC ASSISTED VAGINAL HYSTERECTOMY WITH SALPINGECTOMY;  Surgeon: Zelphia Cairo, MD;  Location: [facility];  Service: Gynecology;  Laterality: Bilateral;  NEEDS BED  . LUMBAR LAMINECTOMY Left 01/25/2019   Procedure: LEFT LUMBAR FIVE TO SACRAL ONE, RECURRENT MICRODISCECTOMY;  Surgeon: Eldred Manges, MD;  Location: [facility] OR;  Service: Orthopedics;  Laterality: Left;  . LUMBAR LAMINECTOMY/DECOMPRESSION MICRODISCECTOMY N/A 04/10/2019   Procedure: LEFT L5-S1 REDO MICRODISCECTOMY;  Surgeon: Kerrin Champagne, MD;  Location: [facility] OR;  Service: Orthopedics;  Laterality: N/A;  . SHOULDER SURGERY  2008, 2009   L shoulder, acromion shave and recurrent dislocation.  Marland Kitchen SPINE SURGERY  03/06/11   lumbar disc L5  . TUBAL LIGATION Bilateral 05/07/2014   Procedure: POST PARTUM TUBAL LIGATION;  Surgeon: Zelphia Cairo, MD;  Location: [facility];  Service: Gynecology;  Laterality: Bilateral;  . VAGINAL DELIVERY  2014, 2015  . WISDOM TOOTH EXTRACTION     Social History   Occupational History  . Not on file  Tobacco Use  . Smoking status: Former Smoker    Types: Cigarettes      Quit date: 2015    Years since quitting: 6.5  . Smokeless tobacco: Never Used  Vaping Use  . Vaping Use: Never used  Substance and Sexual Activity  . Alcohol use: Yes    Alcohol/week: 0.0 standard drinks    Comment: occas  . Drug use: No  . Sexual activity: Not Currently    Birth control/protection: None    Comment: Hysterectomy

## 2020-01-18 ENCOUNTER — Ambulatory Visit (HOSPITAL_COMMUNITY)
Admission: RE | Admit: 2020-01-18 | Discharge: 2020-01-18 | Disposition: A | Discharge status: Home / Self Care | Payer: PRIVATE HEALTH INSURANCE | Source: Ambulatory Visit | Attending: Orthopaedic Surgery | Admitting: Orthopaedic Surgery

## 2020-01-18 ENCOUNTER — Other Ambulatory Visit: Payer: Self-pay

## 2020-01-18 DIAGNOSIS — M4807 Spinal stenosis, lumbosacral region: Secondary | ICD-10-CM | POA: Diagnosis not present

## 2020-01-18 MED ORDER — GADOBUTROL 1 MMOL/ML IV SOLN
7.0000 mL | Freq: Once | INTRAVENOUS | Status: AC | PRN
  Administered 2020-01-18: 7 mL via INTRAVENOUS

## 2020-01-22 ENCOUNTER — Telehealth: Payer: Self-pay | Admitting: Orthopaedic Surgery

## 2020-01-22 NOTE — Telephone Encounter (Signed)
Patient called.   She wanted to know if she could be seen for her MRI review before the next available appointment date. She stated that her MRI is already done but nobody ever called her to set up the review.   Call back: [phone]

## 2020-01-22 NOTE — Telephone Encounter (Signed)
I called patient and worked in to schedule tomorrow. She has fallen twice over the weekend and actually fell down the stairs. She states that she has bruising and thinks that she has messed her knee up. Her leg is giving out. She will discuss at appt tomorrow.

## 2020-01-23 ENCOUNTER — Encounter: Payer: Self-pay | Admitting: Orthopaedic Surgery

## 2020-01-23 ENCOUNTER — Ambulatory Visit (INDEPENDENT_AMBULATORY_CARE_PROVIDER_SITE_OTHER): Payer: PRIVATE HEALTH INSURANCE | Admitting: Orthopaedic Surgery

## 2020-01-23 VITALS — Ht 66.0 in | Wt 198.0 lb

## 2020-01-23 DIAGNOSIS — M5442 Lumbago with sciatica, left side: Secondary | ICD-10-CM

## 2020-01-23 DIAGNOSIS — G8929 Other chronic pain: Secondary | ICD-10-CM

## 2020-01-23 MED ORDER — GABAPENTIN 100 MG PO CAPS: 100.0000 mg | ORAL_CAPSULE | Freq: Three times a day (TID) | ORAL | 0 refills | Status: DC

## 2020-01-23 NOTE — Progress Notes (Signed)
Office Visit Note   Patient: Tonya [name]           Date of Birth: 07-27-1985           MRN: [id number] Visit Date: 01/23/2020              Requested by: Lianne Moris, PA-C [address] [place],  [place] [address] PCP: Lianne Moris, PA-C   Assessment & Plan: Visit Diagnoses:  1. Chronic left-sided low back pain with left-sided sciatica     Plan: We will set patient up for some physical therapy for evaluation and treatment.  We reviewed her previous scans that showed some scar tissue present postoperatively on MRI 01/18/2020.  Small residual left paracentral disc protrusion L5-S1 not appreciably changed without stenosis or neural impingement.  Follow-Up Instructions: Return in about 8 weeks (around 03/19/2020).   Orders:  Orders Placed This Encounter  Procedures  . Ambulatory referral to Physical Therapy   Meds ordered this encounter  Medications  . gabapentin (NEURONTIN) 100 MG capsule    Sig: Take 1 capsule (100 mg total) by mouth 3 (three) times daily.    Dispense:  60 capsule    Refill:  0      Procedures: No procedures performed   Clinical Data: No additional findings.   Subjective: Chief Complaint  Patient presents with  . Lower Back - Follow-up    MRI Review    HPI 34 year old female returns post left L5-S1 microdiscectomy with surgery 01/25/2019 and then surgery for recurrent disc herniation 04/10/2019.  Patient states she is fallen twice since the last visit in early August.  Most recently she fell on the stairs with a sudden sharp pain in her leg and giving way.  She is used ibuprofen without relief she has multiple bruises.  Ongoing back pain left leg weakness and numbness.  Review of Systems 14 point system update unchanged from 01/10/2020 office visit.  Negative for chills or fever.  Previous lumbar surgery as above.  She does have hypertension and migraines.   Objective: Vital Signs: Ht 5\' 6"  (1.676 m)   Wt 198 lb (89.8 kg)   LMP 03/02/2017 (LMP  Unknown)   BMI 31.96 kg/m   Physical Exam Constitutional:      Appearance: She is well-developed.  HENT:     Head: Normocephalic.     Right Ear: External ear normal.     Left Ear: External ear normal.  Eyes:     Pupils: Pupils are equal, round, and reactive to light.  Neck:     Thyroid: No thyromegaly.     Trachea: No tracheal deviation.  Cardiovascular:     Rate and Rhythm: Normal rate.  Pulmonary:     Effort: Pulmonary effort is normal.  Abdominal:     Palpations: Abdomen is soft.  Skin:    General: Skin is warm and dry.  Neurological:     Mental Status: She is alert and oriented to person, place, and time.  Psychiatric:        Behavior: Behavior normal.     Ortho Exam positive straight leg raising on the left 70 degrees positive popliteal compression test.  Sciatic notch tenderness.  Lumbar incisions well-healed.  Knee and ankle jerk are intact.  Anterior tib gastrocsoleus exam demonstrates a left gastrocsoleus weakness with toe raising, normal on the right.  Specialty Comments:  No specialty comments available.  Imaging: No results found.   PMFS History: Patient Active Problem List   Diagnosis Date  Noted  . Recurrent herniation of lumbar disc 04/10/2019    Class: Acute  . Surgery follow-up 04/10/2019  . HNP (herniated nucleus pulposus), lumbar 01/25/2019  . Protrusion of lumbar intervertebral disc 12/13/2018  . Menorrhagia 03/18/2017  . Status post induction of labor 05/07/2014  . SVD (spontaneous vaginal delivery) 05/07/2014  . Near syncope 01/15/2014  . Pregnancy 01/15/2014  . URI 04/01/2010  . COMMON MIGRAINE 12/17/2006  . HYPERTENSION 12/17/2006   Past Medical History:  Diagnosis Date  . Cystitis    Occas  . Dizziness   . Frequent UTI   . GERD (gastroesophageal reflux disease)   . Hand tingling    And feet  . Headache(784.0)   . Hypertension    no meds, patient reports this only while pregnant, not currently  . PONV (postoperative nausea  and vomiting)   . Rapid heart rate    during pregnancy only  . Seasonal allergies     Family History  Problem Relation Age of Onset  . Diabetes Paternal Uncle   . Hypertension Paternal Grandmother   . Obesity Other   . Sleep apnea Other   . Hypertension Maternal Grandmother   . Hypertension Maternal Grandfather   . Diabetes Maternal Grandfather   . Stroke Maternal Grandfather   . Hypertension Paternal Grandfather   . Thrombophlebitis Paternal Grandfather   . Hyperlipidemia Paternal Grandfather     Past Surgical History:  Procedure Laterality Date  . ABDOMINAL HYSTERECTOMY    . BACK SURGERY  2012  . BREAST LUMPECTOMY Left    Benign  . LAPAROSCOPIC VAGINAL HYSTERECTOMY WITH SALPINGECTOMY Bilateral 03/18/2017   Procedure: LAPAROSCOPIC ASSISTED VAGINAL HYSTERECTOMY WITH SALPINGECTOMY;  Surgeon: Zelphia Cairo, MD;  Location: [facility];  Service: Gynecology;  Laterality: Bilateral;  NEEDS BED  . LUMBAR LAMINECTOMY Left 01/25/2019   Procedure: LEFT LUMBAR FIVE TO SACRAL ONE, RECURRENT MICRODISCECTOMY;  Surgeon: Eldred Manges, MD;  Location: [facility] OR;  Service: Orthopedics;  Laterality: Left;  . LUMBAR LAMINECTOMY/DECOMPRESSION MICRODISCECTOMY N/A 04/10/2019   Procedure: LEFT L5-S1 REDO MICRODISCECTOMY;  Surgeon: Kerrin Champagne, MD;  Location: [facility] OR;  Service: Orthopedics;  Laterality: N/A;  . SHOULDER SURGERY  2008, 2009   L shoulder, acromion shave and recurrent dislocation.  Marland Kitchen SPINE SURGERY  03/06/11   lumbar disc L5  . TUBAL LIGATION Bilateral 05/07/2014   Procedure: POST PARTUM TUBAL LIGATION;  Surgeon: Zelphia Cairo, MD;  Location: [facility];  Service: Gynecology;  Laterality: Bilateral;  . VAGINAL DELIVERY  2014, 2015  . WISDOM TOOTH EXTRACTION     Social History   Occupational History  . Not on file  Tobacco Use  . Smoking status: Former Smoker    Types: Cigarettes    Quit date: 2015    Years since quitting: 6.6  . Smokeless tobacco: Never Used  Vaping  Use  . Vaping Use: Never used  Substance and Sexual Activity  . Alcohol use: Yes    Alcohol/week: 0.0 standard drinks    Comment: occas  . Drug use: No  . Sexual activity: Not Currently    Birth control/protection: None    Comment: Hysterectomy

## 2020-02-05 ENCOUNTER — Other Ambulatory Visit: Payer: Self-pay

## 2020-02-05 ENCOUNTER — Encounter: Payer: Self-pay | Admitting: Rehabilitative and Restorative Service Providers"

## 2020-02-05 ENCOUNTER — Ambulatory Visit (INDEPENDENT_AMBULATORY_CARE_PROVIDER_SITE_OTHER): Payer: PRIVATE HEALTH INSURANCE | Admitting: Rehabilitative and Restorative Service Providers"

## 2020-02-05 DIAGNOSIS — M5442 Lumbago with sciatica, left side: Secondary | ICD-10-CM

## 2020-02-05 DIAGNOSIS — G8929 Other chronic pain: Secondary | ICD-10-CM | POA: Diagnosis not present

## 2020-02-05 DIAGNOSIS — R262 Difficulty in walking, not elsewhere classified: Secondary | ICD-10-CM | POA: Diagnosis not present

## 2020-02-05 DIAGNOSIS — M79605 Pain in left leg: Secondary | ICD-10-CM | POA: Diagnosis not present

## 2020-02-05 NOTE — Therapy (Signed)
[facility] Physical Therapy [address] [place], [place], [address] Phone: [phone]   Fax:  [phone]  Physical Therapy Evaluation  Patient Details  Name: Tonya [name] MRN: [id number] Date of Birth: 11-07-1985 Referring Provider (PT): Dr. Ophelia Charter   Encounter Date: 02/05/2020   PT End of Session - 02/05/20 1145    Visit Number 1    Number of Visits 12    Date for PT Re-Evaluation 04/01/20    Progress Note Due on Visit 10    PT Start Time 1148    PT Stop Time 1225    PT Time Calculation (min) 37 min    Activity Tolerance Patient tolerated treatment well    Behavior During Therapy [facility] for tasks assessed/performed           Past Medical History:  Diagnosis Date  . Cystitis    Occas  . Dizziness   . Frequent UTI   . GERD (gastroesophageal reflux disease)   . Hand tingling    And feet  . Headache(784.0)   . Hypertension    no meds, patient reports this only while pregnant, not currently  . PONV (postoperative nausea and vomiting)   . Rapid heart rate    during pregnancy only  . Seasonal allergies     Past Surgical History:  Procedure Laterality Date  . ABDOMINAL HYSTERECTOMY    . BACK SURGERY  2012  . BREAST LUMPECTOMY Left    Benign  . LAPAROSCOPIC VAGINAL HYSTERECTOMY WITH SALPINGECTOMY Bilateral 03/18/2017   Procedure: LAPAROSCOPIC ASSISTED VAGINAL HYSTERECTOMY WITH SALPINGECTOMY;  Surgeon: Zelphia Cairo, MD;  Location: [facility];  Service: Gynecology;  Laterality: Bilateral;  NEEDS BED  . LUMBAR LAMINECTOMY Left 01/25/2019   Procedure: LEFT LUMBAR FIVE TO SACRAL ONE, RECURRENT MICRODISCECTOMY;  Surgeon: Eldred Manges, MD;  Location: [facility] OR;  Service: Orthopedics;  Laterality: Left;  . LUMBAR LAMINECTOMY/DECOMPRESSION MICRODISCECTOMY N/A 04/10/2019   Procedure: LEFT L5-S1 REDO MICRODISCECTOMY;  Surgeon: Kerrin Champagne, MD;  Location: [facility] OR;  Service: Orthopedics;  Laterality: N/A;  . SHOULDER SURGERY  2008, 2009   L  shoulder, acromion shave and recurrent dislocation.  Marland Kitchen SPINE SURGERY  03/06/11   lumbar disc L5  . TUBAL LIGATION Bilateral 05/07/2014   Procedure: POST PARTUM TUBAL LIGATION;  Surgeon: Zelphia Cairo, MD;  Location: [facility];  Service: Gynecology;  Laterality: Bilateral;  . VAGINAL DELIVERY  2014, 2015  . WISDOM TOOTH EXTRACTION      There were no vitals filed for this visit.    Subjective Assessment - 02/05/20 1150    Subjective Pt. stated having multiple surgeries on low back.   Pt. stated waking up with increased complaints and returned to Dr. Ophelia Charter c MRI showing progression of disc.  Pt. indicated possible step for fusion pending overall improvement.  Pt. stated constant symptoms primarily in back and also into Lt LE which resultings in severa falls in last 6 months.    Pertinent History Lumbar surgery hx 3, 2 in last year.    Limitations Sitting;Walking;House hold activities;Other (comment)   sleeping difficulty   Patient Stated Goals Reduce pain, get surgery approval    Currently in Pain? Yes    Pain Score 7    at worst 10/10   Pain Location Back    Pain Orientation Left    Pain Type Chronic pain    Pain Radiating Towards Lt LE    Pain Onset More than a month ago    Pain Frequency Constant  Aggravating Factors  limiting in standing, walking, lying down time (prolonged static positioning    Pain Relieving Factors Nerve medicine helps at times, change positioning    Effect of Pain on Daily Activities Limiting in most things              [facility] PT Assessment - 02/05/20 0001      Assessment   Medical Diagnosis Chronic Lt side back pain, sciatica    Referring Provider (PT) Dr. Ophelia Charter    Onset Date/Surgical Date 10/07/19    Hand Dominance Right    Next MD Visit Oct 12      Restrictions   Weight Bearing Restrictions No      Balance Screen   Has the patient fallen in the past 6 months Yes    How many times? "several times" due to Lt LE pain      Home Environment    Living Environment Private residence    Type of Home House    Home Access Stairs to enter    Entrance Stairs-Number of Steps 6    Entrance Stairs-Rails Left      Prior Function   Vocation Full time employment    Vocation Requirements Desk work    Leisure Off road activity, household activity, kid activity      Cognition   Overall Cognitive Status Within Functional Limits for tasks assessed      Sensation   Light Touch Appears Intact      Posture/Postural Control   Posture Comments Rt lateral trunk shift mild in stance, correction produced complaints x5, mild reduction in lumbar lordosis      ROM / Strength   AROM / PROM / Strength Strength;PROM;AROM      AROM   AROM Assessment Site Lumbar    Lumbar Extension 50% ERP, REIS x 5 c peripiherization to Lt upper thigh laterally.  Prone press up on elbows centralzied symptoms to mid central lumbar, lower lumbar      PROM   PROM Assessment Site Lumbar;Hip      Strength   Strength Assessment Site Ankle;Knee;Hip    Right/Left Hip Left;Right    Right/Left Knee Left;Right    Right/Left Ankle Left;Right      Palpation   SI assessment  Localized symptoms c L1-L5 cPA, mild restriction noted    Palpation comment Severe tenderness, TrP noted in Lt lumbar paraspinals, QL, glute med/min/max Lt      Special Tests    Special Tests Lumbar    Lumbar Tests Slump Test;Straight Leg Raise;FABER test      Slump test   Comment (-) bilateral                      Objective measurements completed on examination: See above findings.       [facility] Adult PT Treatment/Exercise - 02/05/20 0001      Exercises   Exercises Other Exercises;Lumbar    Other Exercises  HEP instruction/performance c handout.  Additional time spent on education on clinical presentation and HEP connection to symptom management.       Lumbar Exercises: Stretches   Lower Trunk Rotation 3 reps   15 sec x 3   Other Lumbar Stretch Exercise standing lumbar ext x  10, prone press on elbows 5 sec hold x 15      Manual Therapy   Manual therapy comments g2 cPA T10-L3  PT Education - 02/05/20 1230    Education Details HEP, POC, DN    Person(s) Educated Patient    Methods Explanation;Demonstration;Verbal cues;Handout    Comprehension Verbalized understanding;Returned demonstration            PT Short Term Goals - 02/05/20 1234      PT SHORT TERM GOAL #1   Title Patient will demonstrate independent use of home exercise program to maintain progress from in clinic treatments.    Time 2    Period Weeks    Status New    Target Date 02/19/20             PT Long Term Goals - 02/05/20 1146      PT LONG TERM GOAL #1   Title Patient will demonstrate/report pain at worst less than or equal to 2/10 to facilitate minimal limitation in daily activity secondary to pain symptoms.    Time 8    Period Weeks    Status New    Target Date 04/01/20      PT LONG TERM GOAL #2   Title Patient will demonstrate independent use of home exercise program to facilitate ability to maintain/progress functional gains from skilled physical therapy services.    Time 8    Period Weeks    Status New    Target Date 04/01/20      PT LONG TERM GOAL #3   Title Patient will demonstrate lumbar extension 100 % WFL s symptoms to facilitate upright standing, walking posture at PLOF s limitation.    Time 8    Period Weeks    Status New    Target Date 04/01/20      PT LONG TERM GOAL #4   Title Pt. will demonstrate correction of postural abnormalities (lateral shift) for upright posture at PLOF.    Time 8    Period Weeks    Status New    Target Date 04/01/20      PT LONG TERM GOAL #5   Title Pt. will demonstrate ability to sleep s restriction.    Time 8    Period Weeks    Status New    Target Date 04/01/20                  Plan - 02/05/20 1147    Clinical Impression Statement Patient is a 34 y.o. female who comes to clinic with  complaints of low back pain, Lt LE pain with mobility deficits c possible directional specific for extension that impair their ability to perform usual daily and recreational functional activities without increase difficulty/symptoms at this time.  Patient to benefit from skilled PT services to address impairments and limitations to improve to previous level of function without restriction secondary to condition.    Personal Factors and Comorbidities Comorbidity 2;Other   history of multiple lumbar surgeries   Comorbidities HTN, GERD    Examination-Activity Limitations Sit;Sleep;Squat;Stairs;Stand;Bend;Bed Mobility;Caring for Others;Carry;Transfers;Dressing;Hygiene/Grooming;Lift;Locomotion Level;Reach Overhead    Examination-Participation Restrictions Community Activity;Yard Work;Occupation;Interpersonal Relationship;Shop;Driving    Stability/Clinical Decision Making Stable/Uncomplicated    Clinical Decision Making Low    Rehab Potential Good    PT Frequency --   1-2x/week   PT Duration 8 weeks    PT Treatment/Interventions ADLs/Self Care Home Management;Cryotherapy;Electrical Stimulation;Iontophoresis 4mg /ml Dexamethasone;Moist Heat;Traction;Balance training;Therapeutic exercise;Therapeutic activities;Functional mobility training;Stair training;Gait training;Ultrasound;Neuromuscular re-education;Patient/family education;Manual techniques;Taping;Dry needling;Passive range of motion;Spinal Manipulations;Joint Manipulations    PT Next Visit Plan Reassess centralization possiblity c extension, dry needling?    PT Home Exercise Plan  [email or web address]    Consulted and Agree with Plan of Care Patient           Patient will benefit from skilled therapeutic intervention in order to improve the following deficits and impairments:  Decreased endurance, Hypomobility, Impaired tone, Decreased activity tolerance, Pain, Decreased balance, Decreased mobility, Difficulty walking, Increased muscle spasms, Decreased range  of motion, Postural dysfunction, Impaired perceived functional ability, Improper body mechanics, Impaired flexibility, Decreased coordination  Visit Diagnosis: Chronic left-sided low back pain with left-sided sciatica  Pain in left leg  Difficulty in walking, not elsewhere classified     Problem List Patient Active Problem List   Diagnosis Date Noted  . Recurrent herniation of lumbar disc 04/10/2019    Class: Acute  . Surgery follow-up 04/10/2019  . HNP (herniated nucleus pulposus), lumbar 01/25/2019  . Protrusion of lumbar intervertebral disc 12/13/2018  . Menorrhagia 03/18/2017  . Status post induction of labor 05/07/2014  . SVD (spontaneous vaginal delivery) 05/07/2014  . Near syncope 01/15/2014  . Pregnancy 01/15/2014  . URI 04/01/2010  . COMMON MIGRAINE 12/17/2006  . HYPERTENSION 12/17/2006    Chyrel MassonMichael Hady Niemczyk, PT, DPT, OCS, ATC 02/05/20  12:35 PM    [facility] [address] [place], [place], [address] Phone: [phone]   Fax:  [phone]  Name: Larey BrickHeather H [name] MRN: [id number] Date of Birth: 1986-06-04

## 2020-02-05 NOTE — Patient Instructions (Signed)
Access Code: [email or web address] URL: [email or web address] Date: 02/05/2020 Prepared by: Chyrel Masson  Exercises Supine Lower Trunk Rotation - 2 x daily - 7 x weekly - 5 reps - 1 sets - 15 hold Prone Press Up on Elbows - 2 x daily - 7 x weekly - 1 sets - 10 reps - 5-10 hold

## 2020-02-26 ENCOUNTER — Ambulatory Visit (INDEPENDENT_AMBULATORY_CARE_PROVIDER_SITE_OTHER): Payer: PRIVATE HEALTH INSURANCE | Admitting: Rehabilitative and Restorative Service Providers"

## 2020-02-26 ENCOUNTER — Other Ambulatory Visit: Payer: Self-pay

## 2020-02-26 ENCOUNTER — Encounter: Payer: Self-pay | Admitting: Rehabilitative and Restorative Service Providers"

## 2020-02-26 DIAGNOSIS — M79605 Pain in left leg: Secondary | ICD-10-CM

## 2020-02-26 DIAGNOSIS — R262 Difficulty in walking, not elsewhere classified: Secondary | ICD-10-CM | POA: Diagnosis not present

## 2020-02-26 DIAGNOSIS — G8929 Other chronic pain: Secondary | ICD-10-CM

## 2020-02-26 DIAGNOSIS — M5442 Lumbago with sciatica, left side: Secondary | ICD-10-CM | POA: Diagnosis not present

## 2020-02-26 NOTE — Therapy (Signed)
[facility] Physical Therapy [address] [place], [place], [address] Phone: [phone]   Fax:  [phone]  Physical Therapy Treatment  Patient Details  Name: Tonya [name] MRN: [id number] Date of Birth: Mar 15, 1986 Referring Provider (PT): Dr. Ophelia Charter   Encounter Date: 02/26/2020   PT End of Session - 02/26/20 0852    Visit Number 2    Number of Visits 12    Date for PT Re-Evaluation 04/01/20    Progress Note Due on Visit 10    PT Start Time 0842    PT Stop Time 0932    PT Time Calculation (min) 50 min    Activity Tolerance Patient tolerated treatment well    Behavior During Therapy [facility] for tasks assessed/performed           Past Medical History:  Diagnosis Date  . Cystitis    Occas  . Dizziness   . Frequent UTI   . GERD (gastroesophageal reflux disease)   . Hand tingling    And feet  . Headache(784.0)   . Hypertension    no meds, patient reports this only while pregnant, not currently  . PONV (postoperative nausea and vomiting)   . Rapid heart rate    during pregnancy only  . Seasonal allergies     Past Surgical History:  Procedure Laterality Date  . ABDOMINAL HYSTERECTOMY    . BACK SURGERY  2012  . BREAST LUMPECTOMY Left    Benign  . LAPAROSCOPIC VAGINAL HYSTERECTOMY WITH SALPINGECTOMY Bilateral 03/18/2017   Procedure: LAPAROSCOPIC ASSISTED VAGINAL HYSTERECTOMY WITH SALPINGECTOMY;  Surgeon: Zelphia Cairo, MD;  Location: [facility];  Service: Gynecology;  Laterality: Bilateral;  NEEDS BED  . LUMBAR LAMINECTOMY Left 01/25/2019   Procedure: LEFT LUMBAR FIVE TO SACRAL ONE, RECURRENT MICRODISCECTOMY;  Surgeon: Eldred Manges, MD;  Location: [facility] OR;  Service: Orthopedics;  Laterality: Left;  . LUMBAR LAMINECTOMY/DECOMPRESSION MICRODISCECTOMY N/A 04/10/2019   Procedure: LEFT L5-S1 REDO MICRODISCECTOMY;  Surgeon: Kerrin Champagne, MD;  Location: [facility] OR;  Service: Orthopedics;  Laterality: N/A;  . SHOULDER SURGERY  2008, 2009   L  shoulder, acromion shave and recurrent dislocation.  Marland Kitchen SPINE SURGERY  03/06/11   lumbar disc L5  . TUBAL LIGATION Bilateral 05/07/2014   Procedure: POST PARTUM TUBAL LIGATION;  Surgeon: Zelphia Cairo, MD;  Location: [facility];  Service: Gynecology;  Laterality: Bilateral;  . VAGINAL DELIVERY  2014, 2015  . WISDOM TOOTH EXTRACTION      There were no vitals filed for this visit.   Subjective Assessment - 02/26/20 0851    Subjective Pt. stated no real change since last visit for back symptoms.  Feeling pain at 7/10 or so.  Pt. indicated maybe having some reduction in leg symptoms.    Pertinent History Lumbar surgery hx 3, 2 in last year.    Limitations Sitting;Walking;House hold activities;Other (comment)   sleeping difficulty   Patient Stated Goals Reduce pain, get surgery approval    Currently in Pain? Yes    Pain Score 7     Pain Location Back    Pain Orientation Left    Pain Descriptors / Indicators Constant    Pain Type Chronic pain    Pain Onset More than a month ago    Pain Frequency Constant    Aggravating Factors  standing, walking., lying down    Pain Relieving Factors adjusting position    Effect of Pain on Daily Activities Limited in most things  [facility] Adult PT Treatment/Exercise - 02/26/20 0001      Lumbar Exercises: Stretches   Lower Trunk Rotation 3 reps   15 sec x 3 bilateral   Press Ups 10 reps   on elbows     Lumbar Exercises: Aerobic   Nustep Lvl 6 10 mins UE/LE      Lumbar Exercises: Supine   Straight Leg Raise 20 reps   bilateral c posterior pelvic tilt   Other Supine Lumbar Exercises supine march c pelvic tilt x 20 bilateral    Other Supine Lumbar Exercises supne pelvic tilt, TrA contraction hold 10 sec x 5      Modalities   Modalities Electrical Stimulation      Electrical Stimulation   Electrical Stimulation Location Lumbar    Electrical Stimulation Action IFC    Electrical Stimulation Parameters to  tolerance 10 mins    Electrical Stimulation Goals Pain      Manual Therapy   Manual therapy comments g2 cPA, Lt uPA L1-L4                    PT Short Term Goals - 02/26/20 3009      PT SHORT TERM GOAL #1   Title Patient will demonstrate independent use of home exercise program to maintain progress from in clinic treatments.    Time 2    Period Weeks    Status Achieved    Target Date 02/19/20             PT Long Term Goals - 02/05/20 1146      PT LONG TERM GOAL #1   Title Patient will demonstrate/report pain at worst less than or equal to 2/10 to facilitate minimal limitation in daily activity secondary to pain symptoms.    Time 8    Period Weeks    Status New    Target Date 04/01/20      PT LONG TERM GOAL #2   Title Patient will demonstrate independent use of home exercise program to facilitate ability to maintain/progress functional gains from skilled physical therapy services.    Time 8    Period Weeks    Status New    Target Date 04/01/20      PT LONG TERM GOAL #3   Title Patient will demonstrate lumbar extension 100 % WFL s symptoms to facilitate upright standing, walking posture at PLOF s limitation.    Time 8    Period Weeks    Status New    Target Date 04/01/20      PT LONG TERM GOAL #4   Title Pt. will demonstrate correction of postural abnormalities (lateral shift) for upright posture at PLOF.    Time 8    Period Weeks    Status New    Target Date 04/01/20      PT LONG TERM GOAL #5   Title Pt. will demonstrate ability to sleep s restriction.    Time 8    Period Weeks    Status New    Target Date 04/01/20                 Plan - 02/26/20 0907    Clinical Impression Statement Indications of some improvement in leg symptoms noted c repeated prone press up activity.  Pt. continued to report and exhibit moderate to severe consistent back symptoms, primarily Lt sided at this time.  Concordant symptoms c cPA, Lt uPA in lumbar assessment  (normal mobility L1-L3, guarding L4, L5).  Continued skilled PT indicated.    Personal Factors and Comorbidities Comorbidity 2;Other   history of multiple lumbar surgeries   Comorbidities HTN, GERD    Examination-Activity Limitations Sit;Sleep;Squat;Stairs;Stand;Bend;Bed Mobility;Caring for Others;Carry;Transfers;Dressing;Hygiene/Grooming;Lift;Locomotion Level;Reach Overhead    Examination-Participation Restrictions Community Activity;Yard Work;Occupation;Interpersonal Relationship;Shop;Driving    Stability/Clinical Decision Making Stable/Uncomplicated    Rehab Potential Good    PT Frequency --   1-2x/week   PT Duration 8 weeks    PT Treatment/Interventions ADLs/Self Care Home Management;Cryotherapy;Electrical Stimulation;Iontophoresis 4mg /ml Dexamethasone;Moist Heat;Traction;Balance training;Therapeutic exercise;Therapeutic activities;Functional mobility training;Stair training;Gait training;Ultrasound;Neuromuscular re-education;Patient/family education;Manual techniques;Taping;Dry needling;Passive range of motion;Spinal Manipulations;Joint Manipulations    PT Next Visit Plan Continue plan for extension, core stability c progression in difficulty as tolerated.    PT Home Exercise Plan [email or web address]    Consulted and Agree with Plan of Care Patient           Patient will benefit from skilled therapeutic intervention in order to improve the following deficits and impairments:  Decreased endurance, Hypomobility, Impaired tone, Decreased activity tolerance, Pain, Decreased balance, Decreased mobility, Difficulty walking, Increased muscle spasms, Decreased range of motion, Postural dysfunction, Impaired perceived functional ability, Improper body mechanics, Impaired flexibility, Decreased coordination  Visit Diagnosis: Chronic left-sided low back pain with left-sided sciatica  Pain in left leg  Difficulty in walking, not elsewhere classified     Problem List Patient Active Problem List    Diagnosis Date Noted  . Recurrent herniation of lumbar disc 04/10/2019    Class: Acute  . Surgery follow-up 04/10/2019  . HNP (herniated nucleus pulposus), lumbar 01/25/2019  . Protrusion of lumbar intervertebral disc 12/13/2018  . Menorrhagia 03/18/2017  . Status post induction of labor 05/07/2014  . SVD (spontaneous vaginal delivery) 05/07/2014  . Near syncope 01/15/2014  . Pregnancy 01/15/2014  . URI 04/01/2010  . COMMON MIGRAINE 12/17/2006  . HYPERTENSION 12/17/2006   02/17/2007, PT, DPT, OCS, ATC 02/26/20  9:21 AM    [facility] Physical Therapy [address] [place], [place], [place] Phone: [phone]   Fax:  [phone]  Name: Tonya [name] MRN: Larey Brick Date of Birth: 1986/04/21

## 2020-03-05 ENCOUNTER — Encounter: Payer: Self-pay | Admitting: Rehabilitative and Restorative Service Providers"

## 2020-03-05 ENCOUNTER — Other Ambulatory Visit: Payer: Self-pay

## 2020-03-05 ENCOUNTER — Ambulatory Visit (INDEPENDENT_AMBULATORY_CARE_PROVIDER_SITE_OTHER): Payer: PRIVATE HEALTH INSURANCE | Admitting: Rehabilitative and Restorative Service Providers"

## 2020-03-05 DIAGNOSIS — M79605 Pain in left leg: Secondary | ICD-10-CM

## 2020-03-05 DIAGNOSIS — G8929 Other chronic pain: Secondary | ICD-10-CM

## 2020-03-05 DIAGNOSIS — M5442 Lumbago with sciatica, left side: Secondary | ICD-10-CM

## 2020-03-05 DIAGNOSIS — R262 Difficulty in walking, not elsewhere classified: Secondary | ICD-10-CM

## 2020-03-05 NOTE — Therapy (Signed)
[facility] Physical Therapy [address] [place], [place], [address] Phone: [phone]   Fax:  [phone]  Physical Therapy Treatment  Patient Details  Name: Tonya [name] MRN: [id number] Date of Birth: 29-Jan-1986 Referring Provider (PT): Dr. Ophelia Charter   Encounter Date: 03/05/2020   PT End of Session - 03/05/20 0807    Visit Number 3    Number of Visits 12    Date for PT Re-Evaluation 04/01/20    Progress Note Due on Visit 10    PT Start Time 0800    PT Stop Time 0840    PT Time Calculation (min) 40 min    Activity Tolerance Patient limited by pain    Behavior During Therapy [facility] for tasks assessed/performed           Past Medical History:  Diagnosis Date  . Cystitis    Occas  . Dizziness   . Frequent UTI   . GERD (gastroesophageal reflux disease)   . Hand tingling    And feet  . Headache(784.0)   . Hypertension    no meds, patient reports this only while pregnant, not currently  . PONV (postoperative nausea and vomiting)   . Rapid heart rate    during pregnancy only  . Seasonal allergies     Past Surgical History:  Procedure Laterality Date  . ABDOMINAL HYSTERECTOMY    . BACK SURGERY  2012  . BREAST LUMPECTOMY Left    Benign  . LAPAROSCOPIC VAGINAL HYSTERECTOMY WITH SALPINGECTOMY Bilateral 03/18/2017   Procedure: LAPAROSCOPIC ASSISTED VAGINAL HYSTERECTOMY WITH SALPINGECTOMY;  Surgeon: Zelphia Cairo, MD;  Location: [facility];  Service: Gynecology;  Laterality: Bilateral;  NEEDS BED  . LUMBAR LAMINECTOMY Left 01/25/2019   Procedure: LEFT LUMBAR FIVE TO SACRAL ONE, RECURRENT MICRODISCECTOMY;  Surgeon: Eldred Manges, MD;  Location: [facility] OR;  Service: Orthopedics;  Laterality: Left;  . LUMBAR LAMINECTOMY/DECOMPRESSION MICRODISCECTOMY N/A 04/10/2019   Procedure: LEFT L5-S1 REDO MICRODISCECTOMY;  Surgeon: Kerrin Champagne, MD;  Location: [facility] OR;  Service: Orthopedics;  Laterality: N/A;  . SHOULDER SURGERY  2008, 2009   L shoulder,  acromion shave and recurrent dislocation.  Marland Kitchen SPINE SURGERY  03/06/11   lumbar disc L5  . TUBAL LIGATION Bilateral 05/07/2014   Procedure: POST PARTUM TUBAL LIGATION;  Surgeon: Zelphia Cairo, MD;  Location: [facility];  Service: Gynecology;  Laterality: Bilateral;  . VAGINAL DELIVERY  2014, 2015  . WISDOM TOOTH EXTRACTION      There were no vitals filed for this visit.   Subjective Assessment - 03/05/20 0803    Subjective Pt. stated constant symptoms in low back to Lt posterior hip.  Pt. stated no causation of HEP because already constant.  Less leg symptoms indicated overall.    Pertinent History Lumbar surgery hx 3, 2 in last year.    Limitations Sitting;Walking;House hold activities;Other (comment)   sleeping difficulty   Patient Stated Goals Reduce pain, get surgery approval    Currently in Pain? Yes    Pain Score 7     Pain Location Back    Pain Orientation Left;Lower    Pain Descriptors / Indicators Other (Comment)   stabbing   Pain Radiating Towards Lt posterior hip    Pain Onset More than a month ago    Pain Frequency Constant              [facility] PT Assessment - 03/05/20 0001      AROM   Lumbar Extension 50% ERP lumbar, REIS  x 5 ERP same, no LE symptoms                         [facility] Adult PT Treatment/Exercise - 03/05/20 0001      Lumbar Exercises: Aerobic   Nustep Lvl 5 10 mins UE/LE      Lumbar Exercises: Standing   Other Standing Lumbar Exercises lumbar ext x 5, side bend x 2 each way      Electrical Stimulation   Electrical Stimulation Location Lumbar    Electrical Stimulation Action IFC    Electrical Stimulation Parameters to tolerance 10 mins    Electrical Stimulation Goals Pain      Manual Therapy   Manual therapy comments STM to Lt lumbar paraspinals, QL            Trigger Point Dry Needling - 03/05/20 0001    Consent Given? Yes    Education Handout Provided Yes    Muscles Treated Back/Hip Quadratus lumborum   Lt   Quadratus  Lumborum Response Twitch response elicited                  PT Short Term Goals - 02/26/20 7793      PT SHORT TERM GOAL #1   Title Patient will demonstrate independent use of home exercise program to maintain progress from in clinic treatments.    Time 2    Period Weeks    Status Achieved    Target Date 02/19/20             PT Long Term Goals - 02/05/20 1146      PT LONG TERM GOAL #1   Title Patient will demonstrate/report pain at worst less than or equal to 2/10 to facilitate minimal limitation in daily activity secondary to pain symptoms.    Time 8    Period Weeks    Status New    Target Date 04/01/20      PT LONG TERM GOAL #2   Title Patient will demonstrate independent use of home exercise program to facilitate ability to maintain/progress functional gains from skilled physical therapy services.    Time 8    Period Weeks    Status New    Target Date 04/01/20      PT LONG TERM GOAL #3   Title Patient will demonstrate lumbar extension 100 % WFL s symptoms to facilitate upright standing, walking posture at PLOF s limitation.    Time 8    Period Weeks    Status New    Target Date 04/01/20      PT LONG TERM GOAL #4   Title Pt. will demonstrate correction of postural abnormalities (lateral shift) for upright posture at PLOF.    Time 8    Period Weeks    Status New    Target Date 04/01/20      PT LONG TERM GOAL #5   Title Pt. will demonstrate ability to sleep s restriction.    Time 8    Period Weeks    Status New    Target Date 04/01/20                 Plan - 03/05/20 0830    Clinical Impression Statement Continued indicated of reduced radicular symptoms into LE at this time.  Strong localized symptoms c muscle guarding and trigger points c high tenderness noted in Lt QL, Lt lumbar paraspinals, Lt glute max/med.  Concordant symptoms in area c trigger  point release treatment indicating good reproduction in treatment.  Advised on HEP use and post  manual soreness relief (heat, ice, movement)    Personal Factors and Comorbidities Comorbidity 2;Other   history of multiple lumbar surgeries   Comorbidities HTN, GERD    Examination-Activity Limitations Sit;Sleep;Squat;Stairs;Stand;Bend;Bed Mobility;Caring for Others;Carry;Transfers;Dressing;Hygiene/Grooming;Lift;Locomotion Level;Reach Overhead    Examination-Participation Restrictions Community Activity;Yard Work;Occupation;Interpersonal Relationship;Shop;Driving    Stability/Clinical Decision Making Stable/Uncomplicated    Rehab Potential Good    PT Frequency --   1-2x/week   PT Duration 8 weeks    PT Treatment/Interventions ADLs/Self Care Home Management;Cryotherapy;Electrical Stimulation;Iontophoresis 4mg /ml Dexamethasone;Moist Heat;Traction;Balance training;Therapeutic exercise;Therapeutic activities;Functional mobility training;Stair training;Gait training;Ultrasound;Neuromuscular re-education;Patient/family education;Manual techniques;Taping;Dry needling;Passive range of motion;Spinal Manipulations;Joint Manipulations    PT Next Visit Plan DN response review, continued mobility improvements    PT Home Exercise Plan [email or web address]    Consulted and Agree with Plan of Care Patient           Patient will benefit from skilled therapeutic intervention in order to improve the following deficits and impairments:  Decreased endurance, Hypomobility, Impaired tone, Decreased activity tolerance, Pain, Decreased balance, Decreased mobility, Difficulty walking, Increased muscle spasms, Decreased range of motion, Postural dysfunction, Impaired perceived functional ability, Improper body mechanics, Impaired flexibility, Decreased coordination  Visit Diagnosis: Chronic left-sided low back pain with left-sided sciatica  Pain in left leg  Difficulty in walking, not elsewhere classified     Problem List Patient Active Problem List   Diagnosis Date Noted  . Recurrent herniation of lumbar disc  04/10/2019    Class: Acute  . Surgery follow-up 04/10/2019  . HNP (herniated nucleus pulposus), lumbar 01/25/2019  . Protrusion of lumbar intervertebral disc 12/13/2018  . Menorrhagia 03/18/2017  . Status post induction of labor 05/07/2014  . SVD (spontaneous vaginal delivery) 05/07/2014  . Near syncope 01/15/2014  . Pregnancy 01/15/2014  . URI 04/01/2010  . COMMON MIGRAINE 12/17/2006  . HYPERTENSION 12/17/2006    02/17/2007, PT, DPT, OCS, ATC 03/05/20  8:33 AM    [facility] Physical Therapy [address] [place], [place], [place] Phone: [phone]   Fax:  [phone]  Name: Tonya [name] MRN: Larey Brick Date of Birth: Dec 29, 1985

## 2020-03-11 ENCOUNTER — Encounter: Payer: PRIVATE HEALTH INSURANCE | Admitting: Rehabilitative and Restorative Service Providers"

## 2020-03-19 ENCOUNTER — Encounter: Payer: Self-pay | Admitting: Orthopaedic Surgery

## 2020-03-19 ENCOUNTER — Ambulatory Visit (INDEPENDENT_AMBULATORY_CARE_PROVIDER_SITE_OTHER): Payer: PRIVATE HEALTH INSURANCE | Admitting: Orthopaedic Surgery

## 2020-03-19 VITALS — BP 146/98 | HR 82 | Ht 66.0 in | Wt 204.0 lb

## 2020-03-19 DIAGNOSIS — M5126 Other intervertebral disc displacement, lumbar region: Secondary | ICD-10-CM

## 2020-03-19 DIAGNOSIS — M5136 Other intervertebral disc degeneration, lumbar region: Secondary | ICD-10-CM | POA: Diagnosis not present
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
## 2020-03-20 ENCOUNTER — Other Ambulatory Visit: Payer: Self-pay

## 2020-03-20 ENCOUNTER — Encounter: Payer: Self-pay | Admitting: Rehabilitative and Restorative Service Providers"

## 2020-03-20 ENCOUNTER — Ambulatory Visit (INDEPENDENT_AMBULATORY_CARE_PROVIDER_SITE_OTHER): Payer: PRIVATE HEALTH INSURANCE | Admitting: Rehabilitative and Restorative Service Providers"

## 2020-03-20 DIAGNOSIS — R262 Difficulty in walking, not elsewhere classified: Secondary | ICD-10-CM

## 2020-03-20 DIAGNOSIS — R293 Abnormal posture: Secondary | ICD-10-CM

## 2020-03-20 DIAGNOSIS — M6281 Muscle weakness (generalized): Secondary | ICD-10-CM | POA: Diagnosis not present

## 2020-03-20 DIAGNOSIS — G8929 Other chronic pain: Secondary | ICD-10-CM

## 2020-03-20 DIAGNOSIS — M5442 Lumbago with sciatica, left side: Secondary | ICD-10-CM

## 2020-03-20 NOTE — Patient Instructions (Signed)
Access Code: [email or web address] URL: [email or web address] Date: 03/20/2020 Prepared by: Pauletta Browns  Exercises Heel Toe Raises with Counter Support - 2-3 x daily - 7 x weekly - 1 sets - 10 reps - 3 seconds hold Standing Lumbar Extension at Wall - Forearms - 5 x daily - 7 x weekly - 1 sets - 5 reps - 3 seconds hold Bridge - 2 x daily - 7 x weekly - 1 sets - 10 reps - 5 seconds hold

## 2020-03-20 NOTE — Therapy (Signed)
[facility] Physical Therapy [address] [place], [place], [address] Phone: [phone]   Fax:  [phone]  Physical Therapy Evaluation  Patient Details  Name: Tonya [name] MRN: [id number] Date of Birth: 1986/04/28 Referring Provider (PT): Dr. Ophelia Charter   Encounter Date: 03/20/2020   PT End of Session - 03/20/20 1804    Visit Number 4    Number of Visits 12    Date for PT Re-Evaluation 04/01/20    Progress Note Due on Visit 10    PT Start Time 0801    PT Stop Time 0840    PT Time Calculation (min) 39 min    Activity Tolerance Patient tolerated treatment well;Patient limited by pain    Behavior During Therapy [facility] for tasks assessed/performed           Past Medical History:  Diagnosis Date  . Cystitis    Occas  . Dizziness   . Frequent UTI   . GERD (gastroesophageal reflux disease)   . Hand tingling    And feet  . Headache(784.0)   . Hypertension    no meds, patient reports this only while pregnant, not currently  . PONV (postoperative nausea and vomiting)   . Rapid heart rate    during pregnancy only  . Seasonal allergies     Past Surgical History:  Procedure Laterality Date  . ABDOMINAL HYSTERECTOMY    . BACK SURGERY  2012  . BREAST LUMPECTOMY Left    Benign  . LAPAROSCOPIC VAGINAL HYSTERECTOMY WITH SALPINGECTOMY Bilateral 03/18/2017   Procedure: LAPAROSCOPIC ASSISTED VAGINAL HYSTERECTOMY WITH SALPINGECTOMY;  Surgeon: Zelphia Cairo, MD;  Location: [facility];  Service: Gynecology;  Laterality: Bilateral;  NEEDS BED  . LUMBAR LAMINECTOMY Left 01/25/2019   Procedure: LEFT LUMBAR FIVE TO SACRAL ONE, RECURRENT MICRODISCECTOMY;  Surgeon: Eldred Manges, MD;  Location: [facility] OR;  Service: Orthopedics;  Laterality: Left;  . LUMBAR LAMINECTOMY/DECOMPRESSION MICRODISCECTOMY N/A 04/10/2019   Procedure: LEFT L5-S1 REDO MICRODISCECTOMY;  Surgeon: Kerrin Champagne, MD;  Location: [facility] OR;  Service: Orthopedics;  Laterality: N/A;  . SHOULDER  SURGERY  2008, 2009   L shoulder, acromion shave and recurrent dislocation.  Marland Kitchen SPINE SURGERY  03/06/11   lumbar disc L5  . TUBAL LIGATION Bilateral 05/07/2014   Procedure: POST PARTUM TUBAL LIGATION;  Surgeon: Zelphia Cairo, MD;  Location: [facility];  Service: Gynecology;  Laterality: Bilateral;  . VAGINAL DELIVERY  2014, 2015  . WISDOM TOOTH EXTRACTION      There were no vitals filed for this visit.    Subjective Assessment - 03/20/20 1800    Subjective Tonya [name] notes she will undergo L5-S1 fusion soon.  She is interested in pre-operative strengthening.    Pertinent History Lumbar surgery hx 3, 2 in last year.    Limitations Sitting;Walking;House hold activities;Other (comment)   sleeping difficulty   Patient Stated Goals Reduce pain, get surgery approval    Currently in Pain? Yes    Pain Score 7     Pain Location Back    Pain Orientation Left;Lower    Pain Type Chronic pain    Pain Radiating Towards L posterior hip    Pain Onset More than a month ago    Pain Frequency Constant    Aggravating Factors  Constant pain    Pain Relieving Factors Change of position    Effect of Pain on Daily Activities Limits participation at work and with caring for her 2 children  Objective measurements completed on examination: See above findings.       [facility] Adult PT Treatment/Exercise - 03/20/20 0001      Therapeutic Activites    Therapeutic Activities ADL's;Lifting    ADL's Golfer's lift; diagonal lift; working in kitchen 25 minutes    Lifting See above      Exercises   Exercises Lumbar      Lumbar Exercises: Stretches   Lower Trunk Rotation 10 seconds   10X   Press Ups 10 reps;5 seconds   Pillow under pelvis     Lumbar Exercises: Standing   Heel Raises 10 reps;3 seconds   Heel to toe raises with transversus abdominus activation   Other Standing Lumbar Exercises Standing trunk extension AROM 10X 3 seconds      Lumbar Exercises: Supine    Bridge 10 reps;5 seconds                  PT Education - 03/20/20 1803    Education Details Spent a lot of time on spine/body mechanics to make Tonya [name]'s life easier at home for pre and post-surgery.    Person(s) Educated Patient    Methods Explanation;Demonstration;Verbal cues;Handout    Comprehension Verbal cues required;Returned demonstration;Need further instruction;Verbalized understanding            PT Short Term Goals - 03/20/20 1803      PT SHORT TERM GOAL #1   Title Patient will demonstrate independent use of home exercise program to maintain progress from in clinic treatments.    Time 2    Period Weeks    Status Achieved    Target Date 02/19/20             PT Long Term Goals - 03/20/20 1803      PT LONG TERM GOAL #1   Title Patient will demonstrate/report pain at worst less than or equal to 2/10 to facilitate minimal limitation in daily activity secondary to pain symptoms.    Time 8    Period Weeks    Status On-going      PT LONG TERM GOAL #2   Title Patient will demonstrate independent use of home exercise program to facilitate ability to maintain/progress functional gains from skilled physical therapy services.    Time 8    Period Weeks    Status On-going      PT LONG TERM GOAL #3   Title Patient will demonstrate lumbar extension 100 % WFL s symptoms to facilitate upright standing, walking posture at PLOF s limitation.    Time 8    Period Weeks    Status On-going      PT LONG TERM GOAL #4   Title Pt. will demonstrate correction of postural abnormalities (lateral shift) for upright posture at PLOF.    Time 8    Period Weeks    Status On-going      PT LONG TERM GOAL #5   Title Pt. will demonstrate ability to sleep s restriction.    Time 8    Period Weeks    Status On-going                  Plan - 03/20/20 1804    Clinical Impression Statement Tonya [name] does note a reduction in her leg symptoms since starting PT.  Low back and L  hip/gluteal pain are functionally limiting at work, home and at night.  She is having a L5-S1 fusion and will benefit from pre-operative strengthening, education and body mechanics  work.    Copywriter, advertising Factors and Comorbidities Comorbidity 2;Other   history of multiple lumbar surgeries   Comorbidities HTN, GERD    Examination-Activity Limitations Sit;Sleep;Squat;Stairs;Stand;Bend;Bed Mobility;Caring for Others;Carry;Transfers;Dressing;Hygiene/Grooming;Lift;Locomotion Air cabin crew Activity;Yard Work;Occupation;Interpersonal Relationship;Shop;Driving    Stability/Clinical Decision Making Stable/Uncomplicated    Rehab Potential Good    PT Frequency --   1-2x/week   PT Duration 8 weeks    PT Treatment/Interventions ADLs/Self Care Home Management;Cryotherapy;Electrical Stimulation;Iontophoresis 4mg /ml Dexamethasone;Moist Heat;Traction;Balance training;Therapeutic exercise;Therapeutic activities;Functional mobility training;Stair training;Gait training;Ultrasound;Neuromuscular re-education;Patient/family education;Manual techniques;Taping;Dry needling;Passive range of motion;Spinal Manipulations;Joint Manipulations    PT Next Visit Plan DN response review, continued mobility improvements    PT Home Exercise Plan [email or web address].  Access Code:    Consulted and Agree with Plan of Care Patient           Patient will benefit from skilled therapeutic intervention in order to improve the following deficits and impairments:  Decreased endurance, Hypomobility, Impaired tone, Decreased activity tolerance, Pain, Decreased balance, Decreased mobility, Difficulty walking, Increased muscle spasms, Decreased range of motion, Postural dysfunction, Impaired perceived functional ability, Improper body mechanics, Impaired flexibility, Decreased coordination  Visit Diagnosis: Abnormal posture  Chronic left-sided low back pain with left-sided sciatica  Muscle  weakness (generalized)  Difficulty in walking, not elsewhere classified     Problem List Patient Active Problem List   Diagnosis Date Noted  . Other intervertebral disc degeneration, lumbar region 03/19/2020  . Recurrent herniation of lumbar disc 04/10/2019    Class: Acute  . Surgery follow-up 04/10/2019  . HNP (herniated nucleus pulposus), lumbar 01/25/2019  . Protrusion of lumbar intervertebral disc 12/13/2018  . Menorrhagia 03/18/2017  . Status post induction of labor 05/07/2014  . SVD (spontaneous vaginal delivery) 05/07/2014  . Near syncope 01/15/2014  . Pregnancy 01/15/2014  . URI 04/01/2010  . COMMON MIGRAINE 12/17/2006  . HYPERTENSION 12/17/2006    02/17/2007 PT, MPT 03/20/2020, 6:07 PM  [facility] Physical Therapy [address] [place], [place], [place] Phone: [phone]   Fax:  [phone]  Name: Tonya [name] MRN: Larey Brick Date of Birth: [birth year]

## 2020-03-27 ENCOUNTER — Encounter: Payer: Self-pay | Admitting: Rehabilitative and Restorative Service Providers"

## 2020-03-27 ENCOUNTER — Ambulatory Visit (INDEPENDENT_AMBULATORY_CARE_PROVIDER_SITE_OTHER): Payer: PRIVATE HEALTH INSURANCE | Admitting: Rehabilitative and Restorative Service Providers"

## 2020-03-27 ENCOUNTER — Other Ambulatory Visit: Payer: Self-pay

## 2020-03-27 DIAGNOSIS — M79605 Pain in left leg: Secondary | ICD-10-CM

## 2020-03-27 DIAGNOSIS — R293 Abnormal posture: Secondary | ICD-10-CM

## 2020-03-27 DIAGNOSIS — R262 Difficulty in walking, not elsewhere classified: Secondary | ICD-10-CM

## 2020-03-27 DIAGNOSIS — G8929 Other chronic pain: Secondary | ICD-10-CM

## 2020-03-27 DIAGNOSIS — M6281 Muscle weakness (generalized): Secondary | ICD-10-CM | POA: Diagnosis not present

## 2020-03-27 DIAGNOSIS — M5442 Lumbago with sciatica, left side: Secondary | ICD-10-CM | POA: Diagnosis not present

## 2020-03-27 NOTE — Therapy (Signed)
[facility] Physical Therapy [address] [place], [place], [address] Phone: [phone]   Fax:  [phone]  Physical Therapy Treatment  Patient Details  Name: Tonya [name] MRN: [id number] Date of Birth: 04-23-1986 Referring Provider (PT): Dr. Ophelia Charter   Encounter Date: 03/27/2020   PT End of Session - 03/27/20 0807    Visit Number 5    Number of Visits 12    Date for PT Re-Evaluation 04/01/20    Progress Note Due on Visit 10    PT Start Time 0801    PT Stop Time 0840    PT Time Calculation (min) 39 min    Activity Tolerance Patient tolerated treatment well;Patient limited by pain    Behavior During Therapy [facility] for tasks assessed/performed           Past Medical History:  Diagnosis Date  . Cystitis    Occas  . Dizziness   . Frequent UTI   . GERD (gastroesophageal reflux disease)   . Hand tingling    And feet  . Headache(784.0)   . Hypertension    no meds, patient reports this only while pregnant, not currently  . PONV (postoperative nausea and vomiting)   . Rapid heart rate    during pregnancy only  . Seasonal allergies     Past Surgical History:  Procedure Laterality Date  . ABDOMINAL HYSTERECTOMY    . BACK SURGERY  2012  . BREAST LUMPECTOMY Left    Benign  . LAPAROSCOPIC VAGINAL HYSTERECTOMY WITH SALPINGECTOMY Bilateral 03/18/2017   Procedure: LAPAROSCOPIC ASSISTED VAGINAL HYSTERECTOMY WITH SALPINGECTOMY;  Surgeon: Zelphia Cairo, MD;  Location: [facility];  Service: Gynecology;  Laterality: Bilateral;  NEEDS BED  . LUMBAR LAMINECTOMY Left 01/25/2019   Procedure: LEFT LUMBAR FIVE TO SACRAL ONE, RECURRENT MICRODISCECTOMY;  Surgeon: Eldred Manges, MD;  Location: [facility] OR;  Service: Orthopedics;  Laterality: Left;  . LUMBAR LAMINECTOMY/DECOMPRESSION MICRODISCECTOMY N/A 04/10/2019   Procedure: LEFT L5-S1 REDO MICRODISCECTOMY;  Surgeon: Kerrin Champagne, MD;  Location: [facility] OR;  Service: Orthopedics;  Laterality: N/A;  . SHOULDER  SURGERY  2008, 2009   L shoulder, acromion shave and recurrent dislocation.  Marland Kitchen SPINE SURGERY  03/06/11   lumbar disc L5  . TUBAL LIGATION Bilateral 05/07/2014   Procedure: POST PARTUM TUBAL LIGATION;  Surgeon: Zelphia Cairo, MD;  Location: [facility];  Service: Gynecology;  Laterality: Bilateral;  . VAGINAL DELIVERY  2014, 2015  . WISDOM TOOTH EXTRACTION      There were no vitals filed for this visit.   Subjective Assessment - 03/27/20 0804    Subjective Pt. stated doing fairly well overall.  Went riding offroad on light trails this weekend.    Pertinent History Lumbar surgery hx 3, 2 in last year.    Limitations Sitting;Walking;House hold activities;Other (comment)   sleeping difficulty   Patient Stated Goals Reduce pain, get surgery approval    Currently in Pain? Yes    Pain Score 6     Pain Location Back    Pain Descriptors / Indicators Aching;Tightness    Pain Type Chronic pain    Pain Radiating Towards denied LE symptoms this morning    Pain Onset More than a month ago    Pain Frequency Constant    Aggravating Factors  constant                             [facility] Adult PT Treatment/Exercise - 03/27/20 0001  Lumbar Exercises: Stretches   Lower Trunk Rotation 5 reps   15 sec x 5 bilateral     Lumbar Exercises: Aerobic   Nustep Lvl 5 10 mins      Lumbar Exercises: Standing   Row Both   3 x 10 blue     Lumbar Exercises: Supine   Bridge 5 seconds;15 reps    Other Supine Lumbar Exercises supine pball press down 10 sec x 10, supine slr c pelvic tilt hold 2 x 10 bilateral      Lumbar Exercises: Quadruped   Opposite Arm/Leg Raise 2 seconds;10 reps;Left arm/Right leg;Right arm/Left leg                    PT Short Term Goals - 03/20/20 1803      PT SHORT TERM GOAL #1   Title Patient will demonstrate independent use of home exercise program to maintain progress from in clinic treatments.    Time 2    Period Weeks    Status Achieved    Target  Date 02/19/20             PT Long Term Goals - 03/20/20 1803      PT LONG TERM GOAL #1   Title Patient will demonstrate/report pain at worst less than or equal to 2/10 to facilitate minimal limitation in daily activity secondary to pain symptoms.    Time 8    Period Weeks    Status On-going      PT LONG TERM GOAL #2   Title Patient will demonstrate independent use of home exercise program to facilitate ability to maintain/progress functional gains from skilled physical therapy services.    Time 8    Period Weeks    Status On-going      PT LONG TERM GOAL #3   Title Patient will demonstrate lumbar extension 100 % WFL s symptoms to facilitate upright standing, walking posture at PLOF s limitation.    Time 8    Period Weeks    Status On-going      PT LONG TERM GOAL #4   Title Pt. will demonstrate correction of postural abnormalities (lateral shift) for upright posture at PLOF.    Time 8    Period Weeks    Status On-going      PT LONG TERM GOAL #5   Title Pt. will demonstrate ability to sleep s restriction.    Time 8    Period Weeks    Status On-going                 Plan - 03/27/20 2458    Clinical Impression Statement Overall reduction of radicular symptoms still noted c consistent low back symptoms present and limiting daily activity from PLOF.  Pt. does continue to present c extension based preference for lowering radicular symptoms. Pt. to benefit from plan of continued skilled PT services c preparation for possible lumbar surgery pending outcome.    Personal Factors and Comorbidities Comorbidity 2;Other   history of multiple lumbar surgeries   Comorbidities HTN, GERD    Examination-Activity Limitations Sit;Sleep;Squat;Stairs;Stand;Bend;Bed Mobility;Caring for Others;Carry;Transfers;Dressing;Hygiene/Grooming;Lift;Locomotion Level;Reach Overhead    Examination-Participation Restrictions Community Activity;Yard Work;Occupation;Interpersonal Relationship;Shop;Driving     Stability/Clinical Decision Making Stable/Uncomplicated    Rehab Potential Good    PT Frequency --   1-2x/week   PT Duration 8 weeks    PT Treatment/Interventions ADLs/Self Care Home Management;Cryotherapy;Electrical Stimulation;Iontophoresis 4mg /ml Dexamethasone;Moist Heat;Traction;Balance training;Therapeutic exercise;Therapeutic activities;Functional mobility training;Stair training;Gait training;Ultrasound;Neuromuscular re-education;Patient/family education;Manual techniques;Taping;Dry needling;Passive  range of motion;Spinal Manipulations;Joint Manipulations    PT Next Visit Plan Progress note, recert next visit.    PT Home Exercise Plan [email or web address].  Access Code: [email or web address]    Consulted and Agree with Plan of Care Patient           Patient will benefit from skilled therapeutic intervention in order to improve the following deficits and impairments:  Decreased endurance, Hypomobility, Impaired tone, Decreased activity tolerance, Pain, Decreased balance, Decreased mobility, Difficulty walking, Increased muscle spasms, Decreased range of motion, Postural dysfunction, Impaired perceived functional ability, Improper body mechanics, Impaired flexibility, Decreased coordination  Visit Diagnosis: Chronic left-sided low back pain with left-sided sciatica  Abnormal posture  Muscle weakness (generalized)  Difficulty in walking, not elsewhere classified  Pain in left leg     Problem List Patient Active Problem List   Diagnosis Date Noted  . Other intervertebral disc degeneration, lumbar region 03/19/2020  . Recurrent herniation of lumbar disc 04/10/2019    Class: Acute  . Surgery follow-up 04/10/2019  . HNP (herniated nucleus pulposus), lumbar 01/25/2019  . Protrusion of lumbar intervertebral disc 12/13/2018  . Menorrhagia 03/18/2017  . Status post induction of labor 05/07/2014  . SVD (spontaneous vaginal delivery) 05/07/2014  . Near syncope 01/15/2014  . Pregnancy 01/15/2014  .  URI 04/01/2010  . COMMON MIGRAINE 12/17/2006  . HYPERTENSION 12/17/2006    Chyrel Masson, PT, DPT, OCS, ATC 03/27/20  8:40 AM    [facility] Physical Therapy [address] [place], [place], [address] Phone: [phone]   Fax:  [phone]  Name: Tonya [name] MRN: [id number] Date of Birth: June 15, 1985

## 2020-04-03 ENCOUNTER — Encounter: Payer: Self-pay | Admitting: Rehabilitative and Restorative Service Providers"

## 2020-04-03 ENCOUNTER — Other Ambulatory Visit: Payer: Self-pay

## 2020-04-03 ENCOUNTER — Encounter: Payer: Self-pay | Admitting: Orthopaedic Surgery

## 2020-04-03 ENCOUNTER — Ambulatory Visit (INDEPENDENT_AMBULATORY_CARE_PROVIDER_SITE_OTHER): Payer: PRIVATE HEALTH INSURANCE | Admitting: Rehabilitative and Restorative Service Providers"

## 2020-04-03 DIAGNOSIS — M6281 Muscle weakness (generalized): Secondary | ICD-10-CM

## 2020-04-03 DIAGNOSIS — R293 Abnormal posture: Secondary | ICD-10-CM

## 2020-04-03 DIAGNOSIS — R262 Difficulty in walking, not elsewhere classified: Secondary | ICD-10-CM | POA: Diagnosis not present

## 2020-04-03 DIAGNOSIS — M5442 Lumbago with sciatica, left side: Secondary | ICD-10-CM | POA: Diagnosis not present

## 2020-04-03 DIAGNOSIS — G8929 Other chronic pain: Secondary | ICD-10-CM

## 2020-04-03 NOTE — Patient Instructions (Signed)
Access Code: [email or web address] URL: [email or web address] Date: 04/03/2020 Prepared by: Pauletta Browns  Exercises Prone Hip Extension - 1 x daily - 7 x weekly - 1-2 sets - 10 reps - 3 seconds hold Standing Hip Hiking - 2 x daily - 7 x weekly - 2 sets - 10 reps - 3 seconds hold

## 2020-04-03 NOTE — Therapy (Signed)
[facility] Physical Therapy [address] [place], [place], [address] Phone: [phone]   Fax:  [phone]  Physical Therapy Treatment  Patient Details  Name: Tonya [name] MRN: [id number] Date of Birth: [birth year] Referring Provider (PT): Dr. Ophelia Charter   Encounter Date: 04/03/2020   PT End of Session - 04/03/20 0843    Visit Number 6    Number of Visits 12    Date for PT Re-Evaluation 04/01/20    Progress Note Due on Visit 10    PT Start Time 0801    PT Stop Time 0844    PT Time Calculation (min) 43 min    Activity Tolerance Patient tolerated treatment well;Patient limited by pain    Behavior During Therapy [facility] for tasks assessed/performed           Past Medical History:  Diagnosis Date   Cystitis    Occas   Dizziness    Frequent UTI    GERD (gastroesophageal reflux disease)    Hand tingling    And feet   Headache(784.0)    Hypertension    no meds, patient reports this only while pregnant, not currently   PONV (postoperative nausea and vomiting)    Rapid heart rate    during pregnancy only   Seasonal allergies     Past Surgical History:  Procedure Laterality Date   ABDOMINAL HYSTERECTOMY     BACK SURGERY  2012   BREAST LUMPECTOMY Left    Benign   LAPAROSCOPIC VAGINAL HYSTERECTOMY WITH SALPINGECTOMY Bilateral 03/18/2017   Procedure: LAPAROSCOPIC ASSISTED VAGINAL HYSTERECTOMY WITH SALPINGECTOMY;  Surgeon: Zelphia Cairo, MD;  Location: [facility] [place];  Service: Gynecology;  Laterality: Bilateral;  NEEDS BED   LUMBAR LAMINECTOMY Left 01/25/2019   Procedure: LEFT LUMBAR FIVE TO SACRAL ONE, RECURRENT MICRODISCECTOMY;  Surgeon: Eldred Manges, MD;  Location: [facility] OR;  Service: Orthopedics;  Laterality: Left;   LUMBAR LAMINECTOMY/DECOMPRESSION MICRODISCECTOMY N/A 04/10/2019   Procedure: LEFT L5-S1 REDO MICRODISCECTOMY;  Surgeon: Kerrin Champagne, MD;  Location: [facility] OR;  Service: Orthopedics;  Laterality: N/A;   SHOULDER  SURGERY  2008, 2009   L shoulder, acromion shave and recurrent dislocation.   SPINE SURGERY  03/06/11   lumbar disc L5   TUBAL LIGATION Bilateral 05/07/2014   Procedure: POST PARTUM TUBAL LIGATION;  Surgeon: Zelphia Cairo, MD;  Location: [facility];  Service: Gynecology;  Laterality: Bilateral;   VAGINAL DELIVERY  2014, 2015   WISDOM TOOTH EXTRACTION      There were no vitals filed for this visit.   Subjective Assessment - 04/03/20 0839    Subjective Tonya [name] notes leg pain has been much better since starting PT.    Pertinent History Lumbar surgery hx 3, 2 in last year.    Limitations Sitting;Walking;House hold activities;Other (comment)   sleeping difficulty   Patient Stated Goals Reduce pain, get surgery approval    Currently in Pain? Yes    Pain Score 8     Pain Location Back    Pain Orientation Lower    Pain Descriptors / Indicators Aching;Tightness    Pain Type Chronic pain    Pain Radiating Towards None    Pain Onset More than a month ago    Pain Frequency Constant    Pain Relieving Factors Change of position    Effect of Pain on Daily Activities Limits house chores and comfort at work  [facility] Adult PT Treatment/Exercise - 04/03/20 0001      Lumbar Exercises: Stretches   Lower Trunk Rotation 10 seconds   10X     Lumbar Exercises: Aerobic   Nustep Level 5 Arms 11 10 minutes      Lumbar Exercises: Standing   Heel Raises 10 reps;3 seconds   2 sets   Row Strengthening;Both;20 reps;Theraband   3 seconds   Theraband Level (Row) Level 4 (Blue)    Other Standing Lumbar Exercises Hip hike 2 sets of 10 3 seconds with pelvic stabilization    Other Standing Lumbar Exercises Standing trunk extension AROM 10X 3 seconds      Lumbar Exercises: Supine   Bridge 20 reps;5 seconds      Lumbar Exercises: Prone   Straight Leg Raise 10 reps;3 seconds   Prone alternating hip extensions     Lumbar Exercises: Quadruped   Opposite Arm/Leg  Raise Left arm/Right leg;Right arm/Left leg;10 reps;3 seconds   2 sets                 PT Education - 04/03/20 0626    Education Details Reviewed and progressed HEP.    Person(s) Educated Patient    Methods Explanation;Demonstration;Verbal cues;Handout            PT Short Term Goals - 03/20/20 1803      PT SHORT TERM GOAL #1   Title Patient will demonstrate independent use of home exercise program to maintain progress from in clinic treatments.    Time 2    Period Weeks    Status Achieved    Target Date 02/19/20             PT Long Term Goals - 04/03/20 0843      PT LONG TERM GOAL #1   Title Patient will demonstrate/report pain at worst less than or equal to 2/10 to facilitate minimal limitation in daily activity secondary to pain symptoms.    Time 8    Period Weeks    Status On-going      PT LONG TERM GOAL #2   Title Patient will demonstrate independent use of home exercise program to facilitate ability to maintain/progress functional gains from skilled physical therapy services.    Time 8    Period Weeks    Status On-going      PT LONG TERM GOAL #3   Title Patient will demonstrate lumbar extension 100 % WFL s symptoms to facilitate upright standing, walking posture at PLOF s limitation.    Time 8    Period Weeks    Status On-going      PT LONG TERM GOAL #4   Title Pt. will demonstrate correction of postural abnormalities (lateral shift) for upright posture at PLOF.    Time 8    Period Weeks    Status On-going      PT LONG TERM GOAL #5   Title Pt. will demonstrate ability to sleep s restriction.    Time 8    Period Weeks    Status On-going                 Plan - 04/03/20 0844    Clinical Impression Statement Tonya [name] continues to note progress with her peripheral symptoms.  Low back pain is still high and function is still affected.  Continue current focus on pre-operative strength.    Personal Factors and Comorbidities Comorbidity 2;Other    history of multiple lumbar surgeries   Comorbidities HTN, GERD  Examination-Activity Limitations Sit;Sleep;Squat;Stairs;Stand;Bend;Bed Mobility;Caring for Others;Carry;Transfers;Dressing;Hygiene/Grooming;Lift;Locomotion Level;Reach Overhead    Examination-Participation Restrictions Community Activity;Yard Work;Occupation;Interpersonal Relationship;Shop;Driving    Stability/Clinical Decision Making Stable/Uncomplicated    Rehab Potential Good    PT Frequency --   1-2x/week   PT Duration 8 weeks    PT Treatment/Interventions ADLs/Self Care Home Management;Cryotherapy;Electrical Stimulation;Iontophoresis 4mg /ml Dexamethasone;Moist Heat;Traction;Balance training;Therapeutic exercise;Therapeutic activities;Functional mobility training;Stair training;Gait training;Ultrasound;Neuromuscular re-education;Patient/family education;Manual techniques;Taping;Dry needling;Passive range of motion;Spinal Manipulations;Joint Manipulations    PT Next Visit Plan Progress note, recert next visit.    PT Home Exercise Plan [email or web address].  Access Code: .  Access Code: [email or web address]    Consulted and Agree with Plan of Care Patient           Patient will benefit from skilled therapeutic intervention in order to improve the following deficits and impairments:  Decreased endurance, Hypomobility, Impaired tone, Decreased activity tolerance, Pain, Decreased balance, Decreased mobility, Difficulty walking, Increased muscle spasms, Decreased range of motion, Postural dysfunction, Impaired perceived functional ability, Improper body mechanics, Impaired flexibility, Decreased coordination  Visit Diagnosis: Abnormal posture  Difficulty in walking, not elsewhere classified  Muscle weakness (generalized)  Chronic left-sided low back pain with left-sided sciatica     Problem List Patient Active Problem List   Diagnosis Date Noted   Other intervertebral disc degeneration, lumbar region 03/19/2020   Recurrent  herniation of lumbar disc 04/10/2019    Class: Acute   Surgery follow-up 04/10/2019   HNP (herniated nucleus pulposus), lumbar 01/25/2019   Protrusion of lumbar intervertebral disc 12/13/2018   Menorrhagia 03/18/2017   Status post induction of labor 05/07/2014   SVD (spontaneous vaginal delivery) 05/07/2014   Near syncope 01/15/2014   Pregnancy 01/15/2014   URI 04/01/2010   COMMON MIGRAINE 12/17/2006   HYPERTENSION 12/17/2006    02/17/2007 PT, MPT 04/03/2020, 8:46 AM  [facility] Physical Therapy [address] [place], [place], [place] Phone: [phone]   Fax:  [phone]  Name: Tonya [name] MRN: [id number] [id number] Date of Birth: 1985-09-17

## 2020-04-12 ENCOUNTER — Other Ambulatory Visit: Payer: Self-pay

## 2020-04-12 ENCOUNTER — Encounter: Payer: Self-pay | Admitting: Rehabilitative and Restorative Service Providers"

## 2020-04-12 ENCOUNTER — Ambulatory Visit (INDEPENDENT_AMBULATORY_CARE_PROVIDER_SITE_OTHER): Payer: PRIVATE HEALTH INSURANCE | Admitting: Rehabilitative and Restorative Service Providers"

## 2020-04-12 DIAGNOSIS — M5416 Radiculopathy, lumbar region: Secondary | ICD-10-CM | POA: Diagnosis not present

## 2020-04-12 DIAGNOSIS — R293 Abnormal posture: Secondary | ICD-10-CM | POA: Diagnosis not present

## 2020-04-12 DIAGNOSIS — R262 Difficulty in walking, not elsewhere classified: Secondary | ICD-10-CM

## 2020-04-12 DIAGNOSIS — M6281 Muscle weakness (generalized): Secondary | ICD-10-CM | POA: Diagnosis not present

## 2020-04-12 NOTE — Therapy (Signed)
[facility] [address] [place], [place], [address] Phone: [phone]   Fax:  [phone]  Physical Therapy Treatment/Reassessment  Patient Details  Name: Tonya [name] MRN: [id number] Date of Birth: 1985-06-12 Referring Provider (PT): Dr. Ophelia Charter   Encounter Date: 04/12/2020   PT End of Session - 04/12/20 1223    Visit Number 7    Number of Visits 12    Date for PT Re-Evaluation 04/01/20    Progress Note Due on Visit 10    PT Start Time 1145    PT Stop Time 1225    PT Time Calculation (min) 40 min    Activity Tolerance Patient tolerated treatment well;Patient limited by pain    Behavior During Therapy [facility] for tasks assessed/performed           Past Medical History:  Diagnosis Date   Cystitis    Occas   Dizziness    Frequent UTI    GERD (gastroesophageal reflux disease)    Hand tingling    And feet   Headache(784.0)    Hypertension    no meds, patient reports this only while pregnant, not currently   PONV (postoperative nausea and vomiting)    Rapid heart rate    during pregnancy only   Seasonal allergies     Past Surgical History:  Procedure Laterality Date   ABDOMINAL HYSTERECTOMY     BACK SURGERY  2012   BREAST LUMPECTOMY Left    Benign   LAPAROSCOPIC VAGINAL HYSTERECTOMY WITH SALPINGECTOMY Bilateral 03/18/2017   Procedure: LAPAROSCOPIC ASSISTED VAGINAL HYSTERECTOMY WITH SALPINGECTOMY;  Surgeon: Zelphia Cairo, MD;  Location: [facility];  Service: Gynecology;  Laterality: Bilateral;  NEEDS BED   LUMBAR LAMINECTOMY Left 01/25/2019   Procedure: LEFT LUMBAR FIVE TO SACRAL ONE, RECURRENT MICRODISCECTOMY;  Surgeon: Eldred Manges, MD;  Location: [facility] OR;  Service: Orthopedics;  Laterality: Left;   LUMBAR LAMINECTOMY/DECOMPRESSION MICRODISCECTOMY N/A 04/10/2019   Procedure: LEFT L5-S1 REDO MICRODISCECTOMY;  Surgeon: Kerrin Champagne, MD;  Location: [facility] OR;  Service: Orthopedics;  Laterality: N/A;     SHOULDER SURGERY  2008, 2009   L shoulder, acromion shave and recurrent dislocation.   SPINE SURGERY  03/06/11   lumbar disc L5   TUBAL LIGATION Bilateral 05/07/2014   Procedure: POST PARTUM TUBAL LIGATION;  Surgeon: Zelphia Cairo, MD;  Location: [facility];  Service: Gynecology;  Laterality: Bilateral;   VAGINAL DELIVERY  2014, 2015   WISDOM TOOTH EXTRACTION      There were no vitals filed for this visit.   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    Pertinent History Lumbar surgery hx 3, 2 in last year.    Limitations Sitting;Walking;House hold activities;Other (comment)   sleeping difficulty   How long can you sit comfortably? 20 minutes    How long can you stand comfortably? 10 minutes    How long can you walk comfortably? 15-20 minutes    Patient Stated Goals Reduce pain, get surgery approval    Currently in Pain? Yes    Pain Score 8     Pain Location Back    Pain Orientation Lower    Pain Descriptors / Indicators Aching;Tightness    Pain Type Chronic pain    Pain Radiating Towards R thigh    Pain Onset More than a month ago    Pain Frequency Constant    Aggravating Factors  Running, prolonged postures    Pain Relieving Factors Change of position    Effect of Pain on Daily Activities Limits participation in all ADLs    Multiple Pain Sites No                             [facility] Adult PT Treatment/Exercise - 04/12/20 0001      Therapeutic Activites    Therapeutic Activities Lifting    Lifting Golfer's and diagonal lift mechanics      Lumbar Exercises: Stretches   Lower Trunk Rotation 10 seconds   10X     Lumbar Exercises: Aerobic   Nustep Level 5 Arms 11 10 minutes      Lumbar Exercises: Standing   Heel Raises 10 reps;3 seconds   2 sets   Row Other (comment)   Next visit   Other Standing Lumbar Exercises Hip hike 2 sets of 10 3 seconds and 5X 3 seconds  with pelvic stabilization    Other Standing Lumbar Exercises Standing trunk extension AROM 10X 3 seconds      Lumbar Exercises: Supine   Bridge 10 reps;5 seconds      Lumbar Exercises: Prone   Straight Leg Raise 10 reps;3 seconds   Prone alternating hip extensions   Opposite Arm/Leg Raise Right arm/Left leg;Left arm/Right leg;10 reps;3 seconds      Lumbar Exercises: Quadruped   Opposite Arm/Leg Raise Left arm/Right leg;Right arm/Left leg;10 reps;3 seconds   2 sets                 PT Education - 04/12/20 1219    Education Details Reviewed body mechanics, lift techniques and HEP with progressions (pelvic stabilization with hip abduction and prone arm/leg).    Person(s) Educated Patient    Methods Explanation;Demonstration;Verbal cues    Comprehension Verbalized understanding;Returned demonstration;Verbal cues required;Need further instruction            PT Short Term Goals - 03/20/20 1803      PT SHORT TERM GOAL #1   Title Patient will demonstrate independent use of home exercise program to maintain progress from in clinic treatments.    Time 2    Period Weeks    Status Achieved    Target Date 02/19/20             PT Long Term Goals - 04/12/20 1222      PT LONG TERM GOAL #1   Title Patient will demonstrate/report pain at worst less than or equal to 2/10 to facilitate minimal limitation in daily activity secondary to pain symptoms.    Time 8    Period Weeks    Status On-going      PT LONG TERM GOAL #2   Title Patient will demonstrate independent use of home exercise program to facilitate ability to maintain/progress functional gains from skilled physical therapy services.    Time 8    Period Weeks    Status On-going      PT LONG TERM GOAL #3   Title Patient will demonstrate lumbar extension 100 % WFL s symptoms to facilitate upright standing, walking posture at PLOF s limitation.    Time 8    Period Weeks    Status On-going      PT LONG TERM GOAL #4    Title Pt. will demonstrate correction of postural abnormalities (lateral shift) for upright posture at PLOF.    Time 8    Period Weeks  Status Achieved      PT LONG TERM GOAL #5   Title Pt. will demonstrate ability to sleep s restriction.    Time 8    Period Weeks    Status On-going                 Plan - 04/12/20 1224    Clinical Impression Statement Tonya [name] has made some early progress with her physical therapy, although back pain is still very limiting.  Sciatic symptoms are less severe and less frequent.  Prolonged sitting (required for work and school) and sleep are limited by back pain.  Physical therapy focus is on core strengthening and body mechanics to prepare for or try to avoid another spine surgery.  Tonya [name] will follow-up with Dr. Ophelia Charter next week to see what his recommendations are.    Personal Factors and Comorbidities Comorbidity 2;Other   history of multiple lumbar surgeries   Comorbidities HTN, GERD    Examination-Activity Limitations Sit;Sleep;Squat;Stairs;Stand;Bend;Bed Mobility;Caring for Others;Carry;Transfers;Dressing;Hygiene/Grooming;Lift;Locomotion Level;Reach Overhead    Examination-Participation Restrictions Community Activity;Yard Work;Occupation;Interpersonal Relationship;Shop;Driving    Stability/Clinical Decision Making Stable/Uncomplicated    Rehab Potential Good    PT Frequency 2x / week   1-2x/week   PT Duration 4 weeks    PT Treatment/Interventions ADLs/Self Care Home Management;Cryotherapy;Electrical Stimulation;Iontophoresis 4mg /ml Dexamethasone;Moist Heat;Traction;Balance training;Therapeutic exercise;Therapeutic activities;Functional mobility training;Stair training;Gait training;Ultrasound;Neuromuscular re-education;Patient/family education;Manual techniques;Taping;Dry needling;Passive range of motion;Spinal Manipulations;Joint Manipulations    PT Next Visit Plan Continue cores strength in preparation for or to avoid back surgery.    PT Home  Exercise Plan [id number].  Access Code: .  Access Code: [email or web address]    Consulted and Agree with Plan of Care Patient           Patient will benefit from skilled therapeutic intervention in order to improve the following deficits and impairments:  Decreased endurance, Hypomobility, Impaired tone, Decreased activity tolerance, Pain, Decreased balance, Decreased mobility, Difficulty walking, Increased muscle spasms, Decreased range of motion, Postural dysfunction, Impaired perceived functional ability, Improper body mechanics, Impaired flexibility, Decreased coordination  Visit Diagnosis: Abnormal posture  Difficulty in walking, not elsewhere classified  Muscle weakness (generalized)  Radiculopathy, lumbar region     Problem List Patient Active Problem List   Diagnosis Date Noted   Other intervertebral disc degeneration, lumbar region 03/19/2020   Recurrent herniation of lumbar disc 04/10/2019    Class: Acute   Surgery follow-up 04/10/2019   HNP (herniated nucleus pulposus), lumbar 01/25/2019   Protrusion of lumbar intervertebral disc 12/13/2018   Menorrhagia 03/18/2017   Status post induction of labor 05/07/2014   SVD (spontaneous vaginal delivery) 05/07/2014   Near syncope 01/15/2014   Pregnancy 01/15/2014   URI 04/01/2010   COMMON MIGRAINE 12/17/2006   HYPERTENSION 12/17/2006    02/17/2007 PT, MPT 04/12/2020, 12:54 PM   [facility] [address] [place], [place], [place] Phone: [phone]   Fax:  [phone]  Name: Tonya [name] MRN: Larey Brick Date of Birth: 1985/08/26

## 2020-04-16 ENCOUNTER — Other Ambulatory Visit: Payer: Self-pay

## 2020-04-16 ENCOUNTER — Ambulatory Visit (INDEPENDENT_AMBULATORY_CARE_PROVIDER_SITE_OTHER): Payer: PRIVATE HEALTH INSURANCE | Admitting: Orthopaedic Surgery

## 2020-04-16 ENCOUNTER — Encounter: Payer: Self-pay | Admitting: Orthopaedic Surgery

## 2020-04-16 VITALS — BP 146/100 | HR 83 | Ht 66.0 in | Wt 200.0 lb

## 2020-04-16 DIAGNOSIS — M5126 Other intervertebral disc displacement, lumbar region: Secondary | ICD-10-CM | POA: Diagnosis not present

## 2020-04-16 MED ORDER — HYDROCODONE-ACETAMINOPHEN 5-325 MG PO TABS: 1.0000 | ORAL_TABLET | Freq: Four times a day (QID) | ORAL | 0 refills | Status: DC | PRN

## 2020-04-16 MED ORDER — METHOCARBAMOL 500 MG PO TABS: 500.0000 mg | ORAL_TABLET | Freq: Three times a day (TID) | ORAL | 0 refills | Status: DC | PRN
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
## 2020-04-19 ENCOUNTER — Encounter: Payer: PRIVATE HEALTH INSURANCE | Admitting: Rehabilitative and Restorative Service Providers"

## 2020-04-19 ENCOUNTER — Telehealth: Payer: Self-pay | Admitting: Rehabilitative and Restorative Service Providers"

## 2020-04-19 NOTE — Addendum Note (Signed)
Addended by: Wende Crease on: 04/19/2020 10:51 AM   Modules accepted: Orders

## 2020-04-19 NOTE — Telephone Encounter (Signed)
Clinician called Pt. Today to follow up on plan after MD visit and surgery scheduled.  Discussion resulting in plan for continued HEP performance and return 1x prior to surgery for pre surgical HEP review, educational session.  Pt. Was in agreement.   Chyrel Masson, PT, DPT, OCS, ATC 04/19/20  10:53 AM

## 2020-04-26 ENCOUNTER — Encounter: Payer: PRIVATE HEALTH INSURANCE | Admitting: Rehabilitative and Restorative Service Providers"

## 2020-04-29 ENCOUNTER — Other Ambulatory Visit: Payer: Self-pay

## 2020-04-29 ENCOUNTER — Encounter: Payer: Self-pay | Admitting: Radiology

## 2020-04-29 ENCOUNTER — Other Ambulatory Visit: Payer: Self-pay | Admitting: Radiology

## 2020-04-29 ENCOUNTER — Telehealth: Payer: Self-pay | Admitting: Radiology

## 2020-04-29 ENCOUNTER — Ambulatory Visit (INDEPENDENT_AMBULATORY_CARE_PROVIDER_SITE_OTHER): Payer: PRIVATE HEALTH INSURANCE | Admitting: Rehabilitative and Restorative Service Providers"

## 2020-04-29 DIAGNOSIS — M6281 Muscle weakness (generalized): Secondary | ICD-10-CM

## 2020-04-29 DIAGNOSIS — Z87891 Personal history of nicotine dependence: Secondary | ICD-10-CM

## 2020-04-29 DIAGNOSIS — R293 Abnormal posture: Secondary | ICD-10-CM

## 2020-04-29 DIAGNOSIS — M79605 Pain in left leg: Secondary | ICD-10-CM

## 2020-04-29 DIAGNOSIS — M5442 Lumbago with sciatica, left side: Secondary | ICD-10-CM

## 2020-04-29 DIAGNOSIS — R262 Difficulty in walking, not elsewhere classified: Secondary | ICD-10-CM

## 2020-04-29 DIAGNOSIS — M5416 Radiculopathy, lumbar region: Secondary | ICD-10-CM | POA: Diagnosis not present

## 2020-04-29 DIAGNOSIS — G8929 Other chronic pain: Secondary | ICD-10-CM

## 2020-04-29 NOTE — Telephone Encounter (Signed)
Per Amy Bishop in [facility] requires a nicotine test for all spinal surgeries whether pt is a smoker or not. This pertains to this patient. Can you order ASAP? Surgery is Dec 1st with Dr. Ophelia Charter.    Note on 04-23-20 from [facility] regarding authorization for procedure:   S/w Lurena Joiner @ [facility], precert required  Will fax form for me to complete and fax back with clinical  Also pt will need nicotine test (whether smoker or not) sent to them as well before they can approve    Test entered. I called patient and advised insurance is requesting this test prior to her being able to have surgery. I advised patient she would need to get test as soon as she is able.

## 2020-04-29 NOTE — Patient Instructions (Signed)
Access Code: [facility] URL: [email or web address] Date: 04/29/2020 Prepared by: Chyrel Masson  Patient Education What Is Pain? Treating Persistent Pain Without Medications: Exercise Treating Persistent Pain Without Medications: Relaxation Treating Persistent Pain Without Medications: Mindfulness Pain Monitoring Model

## 2020-04-29 NOTE — Therapy (Addendum)
[facility] [address] [place], [place], [address] Phone: [phone]   Fax:  [phone]  Physical Therapy Treatment  Patient Details  Name: Tonya [name] MRN: [id number] Date of Birth: [birth year] Referring Provider (PT): Dr. Lorin Mercy  PHYSICAL THERAPY DISCHARGE SUMMARY  Visits from Start of Care: 8  Current functional level related to goals / functional outcomes: See note   Remaining deficits: See note- upcoming surgery   Education / Equipment: HEP  Plan: Patient agrees to discharge.  Patient goals were partially met. Patient is being discharged due to the patient's request.  ?????     Encounter Date: 04/29/2020   PT End of Session - 04/29/20 1223    Visit Number 8    Number of Visits 12    Date for PT Re-Evaluation --    Progress Note Due on Visit 10    PT Start Time 1150    PT Stop Time 1215    PT Time Calculation (min) 25 min    Activity Tolerance Patient limited by pain    Behavior During Therapy [facility] for tasks assessed/performed           Past Medical History:  Diagnosis Date  . Cystitis    Occas  . Dizziness   . Frequent UTI   . GERD (gastroesophageal reflux disease)   . Hand tingling    And feet  . Headache(784.0)   . Hypertension    no meds, patient reports this only while pregnant, not currently  . PONV (postoperative nausea and vomiting)   . Rapid heart rate    during pregnancy only  . Seasonal allergies     Past Surgical History:  Procedure Laterality Date  . ABDOMINAL HYSTERECTOMY    . BACK SURGERY  2012  . BREAST LUMPECTOMY Left    Benign  . LAPAROSCOPIC VAGINAL HYSTERECTOMY WITH SALPINGECTOMY Bilateral 03/18/2017   Procedure: LAPAROSCOPIC ASSISTED VAGINAL HYSTERECTOMY WITH SALPINGECTOMY;  Surgeon: Marylynn Pearson, MD;  Location: [place];  Service: Gynecology;  Laterality: Bilateral;  NEEDS BED  . LUMBAR LAMINECTOMY Left 01/25/2019   Procedure: LEFT LUMBAR FIVE TO SACRAL ONE,  RECURRENT MICRODISCECTOMY;  Surgeon: Marybelle Killings, MD;  Location: [place];  Service: Orthopedics;  Laterality: Left;  . LUMBAR LAMINECTOMY/DECOMPRESSION MICRODISCECTOMY N/A 04/10/2019   Procedure: LEFT L5-S1 REDO MICRODISCECTOMY;  Surgeon: Jessy Oto, MD;  Location: [place];  Service: Orthopedics;  Laterality: N/A;  . SHOULDER SURGERY  2008, 2009   L shoulder, acromion shave and recurrent dislocation.  Marland Kitchen SPINE SURGERY  03/06/11   lumbar disc L5  . TUBAL LIGATION Bilateral 05/07/2014   Procedure: POST PARTUM TUBAL LIGATION;  Surgeon: Marylynn Pearson, MD;  Location: [facility];  Service: Gynecology;  Laterality: Bilateral;  . VAGINAL DELIVERY  2014, 2015  . WISDOM TOOTH EXTRACTION      There were no vitals filed for this visit.                      [place] Adult PT Treatment/Exercise - 04/29/20 0001      Self-Care   Self-Care Other Self-Care Comments    Other Self-Care Comments  Extended Pt. education given today in prep for upcoming surgery.  Cues given on pain management model for activity modification and pain management pre and post surgery.  Education also given on surgical intervention and common post surgical symptoms/expectations      Neuro Re-ed    Neuro Re-ed Details  Pain neuroscience education regarding central  sensitization c cues and visual handouts on neural adaptability, exercise tolerance increasing progress, meditation and mindfulness for central desensitization                   PT Education - 04/29/20 1220    Education Details Self care and neuro re-ed as detailed    Person(s) Educated Patient    Methods Handout;Explanation    Comprehension Verbalized understanding            PT Short Term Goals - 03/20/20 1803      PT SHORT TERM GOAL #1   Title Patient will demonstrate independent use of home exercise program to maintain progress from in clinic treatments.    Time 2    Period Weeks    Status Achieved    Target Date 02/19/20              PT Long Term Goals - 04/12/20 1222      PT LONG TERM GOAL #1   Title Patient will demonstrate/report pain at worst less than or equal to 2/10 to facilitate minimal limitation in daily activity secondary to pain symptoms.    Time 8    Period Weeks    Status On-going      PT LONG TERM GOAL #2   Title Patient will demonstrate independent use of home exercise program to facilitate ability to maintain/progress functional gains from skilled physical therapy services.    Time 8    Period Weeks    Status On-going      PT LONG TERM GOAL #3   Title Patient will demonstrate lumbar extension 100 % WFL s symptoms to facilitate upright standing, walking posture at PLOF s limitation.    Time 8    Period Weeks    Status On-going      PT LONG TERM GOAL #4   Title Pt. will demonstrate correction of postural abnormalities (lateral shift) for upright posture at PLOF.    Time 8    Period Weeks    Status Achieved      PT LONG TERM GOAL #5   Title Pt. will demonstrate ability to sleep s restriction.    Time 8    Period Weeks    Status On-going                 Plan - 04/29/20 1221    Clinical Impression Statement Pt. to undergo surgery for lumbar complaints.  To this point, Pt. has reported improement in radicular symptoms but continued severity and irritability of lumbar symptoms and its limitation on her daily functional life has prompted surgical intervention.  Reviewed expectations of surgical experience as well neuro re-ed on neural sensitivity in chronic pain complaints in efforts to promote best surgical outcome through additional patient education.    Personal Factors and Comorbidities Comorbidity 2;Other   history of multiple lumbar surgeries   Comorbidities HTN, GERD    Examination-Activity Limitations Sit;Sleep;Squat;Stairs;Stand;Bend;Bed Mobility;Caring for Others;Carry;Transfers;Dressing;Hygiene/Grooming;Lift;Locomotion Level;Reach Overhead    Examination-Participation  Restrictions Community Activity;Yard Work;Occupation;Interpersonal Relationship;Shop;Driving    Stability/Clinical Decision Making Stable/Uncomplicated    Rehab Potential Good    PT Frequency 2x / week   1-2x/week   PT Duration 4 weeks    PT Treatment/Interventions ADLs/Self Care Home Management;Cryotherapy;Electrical Stimulation;Iontophoresis 78m/ml Dexamethasone;Moist Heat;Traction;Balance training;Therapeutic exercise;Therapeutic activities;Functional mobility training;Stair training;Gait training;Ultrasound;Neuromuscular re-education;Patient/family education;Manual techniques;Taping;Dry needling;Passive range of motion;Spinal Manipulations;Joint Manipulations    PT Next Visit Plan On hold at this time, possible return post surgery for a re-eval if indicated  PT Home Exercise Plan [email or web address].  Access Code: [email or web address].  Access Code: [email or web address]    Consulted and Agree with Plan of Care Patient           Patient will benefit from skilled therapeutic intervention in order to improve the following deficits and impairments:  Decreased endurance, Hypomobility, Impaired tone, Decreased activity tolerance, Pain, Decreased balance, Decreased mobility, Difficulty walking, Increased muscle spasms, Decreased range of motion, Postural dysfunction, Impaired perceived functional ability, Improper body mechanics, Impaired flexibility, Decreased coordination  Visit Diagnosis: Chronic left-sided low back pain with left-sided sciatica  Radiculopathy, lumbar region  Pain in left leg  Muscle weakness (generalized)  Abnormal posture  Difficulty in walking, not elsewhere classified     Problem List Patient Active Problem List   Diagnosis Date Noted  . Other intervertebral disc degeneration, lumbar region 03/19/2020  . Recurrent herniation of lumbar disc 04/10/2019    Class: Acute  . Surgery follow-up 04/10/2019  . HNP (herniated nucleus pulposus), lumbar 01/25/2019  . Protrusion of lumbar  intervertebral disc 12/13/2018  . Menorrhagia 03/18/2017  . Status post induction of labor 05/07/2014  . SVD (spontaneous vaginal delivery) 05/07/2014  . Near syncope 01/15/2014  . Pregnancy 01/15/2014  . URI 04/01/2010  . COMMON MIGRAINE 12/17/2006  . HYPERTENSION 12/17/2006    Scot Jun, PT, DPT, OCS, ATC 04/29/20  12:28 PM  Farley Ly PT, MPT  [facility] [address] [place], [place], [address] Phone: [phone]   Fax:  [phone]  Name: Tonya [name] MRN: [id number] Date of Birth: 09-28-1985

## 2020-04-30 ENCOUNTER — Inpatient Hospital Stay (HOSPITAL_COMMUNITY)
Admission: RE | Admit: 2020-04-30 | Discharge: 2020-04-30 | Disposition: A | Discharge status: Home / Self Care | Payer: PRIVATE HEALTH INSURANCE | Source: Ambulatory Visit

## 2020-05-01 ENCOUNTER — Other Ambulatory Visit: Payer: Self-pay

## 2020-05-01 ENCOUNTER — Ambulatory Visit (INDEPENDENT_AMBULATORY_CARE_PROVIDER_SITE_OTHER): Payer: PRIVATE HEALTH INSURANCE | Admitting: Surgery

## 2020-05-01 ENCOUNTER — Telehealth: Payer: Self-pay | Admitting: Orthopaedic Surgery

## 2020-05-01 ENCOUNTER — Encounter: Payer: Self-pay | Admitting: Surgery

## 2020-05-01 VITALS — BP 147/85 | HR 82 | Ht 66.0 in | Wt 205.0 lb

## 2020-05-01 DIAGNOSIS — M5126 Other intervertebral disc displacement, lumbar region: Secondary | ICD-10-CM

## 2020-05-01 DIAGNOSIS — M5416 Radiculopathy, lumbar region: Secondary | ICD-10-CM

## 2020-05-01 NOTE — Telephone Encounter (Signed)
Received 2 medical records release forms and 2 Disability/FMLA forms from the patient and a $50.00 check- Forwarding to [employer]

## 2020-05-01 NOTE — Progress Notes (Signed)
34 year old white female with history of recurrent L5-S1 HNP returns.  States that she continues have on going low back pain with bilateral buttock pain and intermittent left lower extremity radiculopathy.  She has been going to physical therapy and states that left leg pain is improved.  States that back pain is having a significant negative impact on her quality of life.  She is unable to do simple things with her family, household chores and work is becoming more difficult due to her ongoing problem..  She is wanting to proceed with L5-S1 fusion as scheduled next week.  Today history and physical performed.  Review of systems negative.  Plan We will proceed with surgery.  Surgery procedure discussed along with potential recovery time.  Advised patient that she can anticipate being out of work at least 8 to 12 weeks postop.  Her husband who is a IT sales professional will be able to take off to assist her.  If he is able to be out of work at least 4 to 6 weeks I think that would be of tremendous help to her.  They do have 2 young children ages 85 and 2.  All questions answered.

## 2020-05-03 NOTE — Progress Notes (Signed)
[facility] - [place], [place] - [address] [place] [address] Phone: [phone] Fax: [phone]  [facility] (New Address) - [place], [place] - [address] AT Previously: [place], [place] [address] 4th [address] [place] [address] Phone: [phone] Fax: [phone]      Your procedure is scheduled on 05/10/2020.  Report to [facility] Main Entrance "A" at 05:30 A.M., and check in at the Admitting office.  Call this number if you have problems the morning of surgery:  [phone]  Call [phone] if you have any questions prior to your surgery date Monday-Friday 8am-4pm    Remember:  Do not eat after midnight the night before your surgery  You may drink clear liquids until 04:30am the morning of your surgery.   Clear liquids allowed are: Water, Non-Citrus Juices (without pulp), Carbonated Beverages, Clear Tea, Black Coffee Only, and Gatorade  Patient Instructions   The night before surgery:  o No food after midnight. ONLY clear liquids after midnight   The day of surgery (if you do NOT have diabetes):  o Drink ONE (1) Pre-Surgery Clear Ensure by 4:30am the morning of surgery. Please make this the last thing you drink. Drink in one sitting, do not sip.  o This drink was given to you during your hospital  pre-op appointment visit. o Nothing else to drink after completing the  Pre-Surgery Clear Ensure.          If you have questions, please contact your surgeons office.     Take these medicines the morning of surgery with A SIP OF WATER  gabapentin (NEURONTIN)   If needed:        acetaminophen (TYLENOL)      HYDROcodone-acetaminophen (NORCO/VICODIN)      methocarbamol (ROBAXIN)   As of today, STOP taking any Aspirin (unless otherwise instructed by your surgeon) Aleve, Naproxen, Ibuprofen, Motrin, Advil, Goody's, BC's, all herbal  medications, fish oil, and all vitamins.                      Do not wear jewelry, make up, or nail polish            Do not wear lotions, powders, perfumes/colognes, or deodorant.            Do not shave 48 hours prior to surgery.              Do not bring valuables to the hospital.            [facility] is not responsible for any belongings or valuables.  Do NOT Smoke (Tobacco/Vaping) or drink Alcohol 24 hours prior to your procedure If you use a CPAP at night, you may bring all equipment for your overnight stay.   Contacts, glasses, dentures or bridgework may not be worn into surgery.      For patients admitted to the hospital, discharge time will be determined by your treatment team.   Patients discharged the day of surgery will not be allowed to drive home, and someone needs to stay with them for 24 hours.    Special instructions:   [facility]- Preparing For Surgery  Before surgery, you can play an important role. Because skin is not sterile, your skin needs to be as free of germs as possible. You can reduce the number of germs on your skin by washing with CHG (chlorahexidine gluconate) Soap before  surgery.  CHG is an antiseptic cleaner which kills germs and bonds with the skin to continue killing germs even after washing.    Oral Hygiene is also important to reduce your risk of infection.  Remember - BRUSH YOUR TEETH THE MORNING OF SURGERY WITH YOUR REGULAR TOOTHPASTE  Please do not use if you have an allergy to CHG or antibacterial soaps. If your skin becomes reddened/irritated stop using the CHG.  Do not shave (including legs and underarms) for at least 48 hours prior to first CHG shower. It is OK to shave your face.  Please follow these instructions carefully.   1. Shower the NIGHT BEFORE SURGERY and the MORNING OF SURGERY with CHG Soap.   2. If you chose to wash your hair, wash your hair first as usual with your normal shampoo.  3. After you shampoo, rinse your hair  and body thoroughly to remove the shampoo.  4. Use CHG as you would any other liquid soap. You can apply CHG directly to the skin and wash gently with a scrungie or a clean washcloth.   5. Apply the CHG Soap to your body ONLY FROM THE NECK DOWN.  Do not use on open wounds or open sores. Avoid contact with your eyes, ears, mouth and genitals (private parts). Wash Face and genitals (private parts)  with your normal soap.   6. Wash thoroughly, paying special attention to the area where your surgery will be performed.  7. Thoroughly rinse your body with warm water from the neck down.  8. DO NOT shower/wash with your normal soap after using and rinsing off the CHG Soap.  9. Pat yourself dry with a CLEAN TOWEL.  10. Wear CLEAN PAJAMAS to bed the night before surgery  11. Place CLEAN SHEETS on your bed the night of your first shower and DO NOT SLEEP WITH PETS.   Day of Surgery: Wear Clean/Comfortable clothing the morning of surgery Do not apply any deodorants/lotions.   Remember to brush your teeth WITH YOUR REGULAR TOOTHPASTE.   Please read over the following fact sheets that you were given.

## 2020-05-06 ENCOUNTER — Encounter (HOSPITAL_COMMUNITY): Payer: Self-pay

## 2020-05-06 ENCOUNTER — Encounter (HOSPITAL_COMMUNITY)
Admission: RE | Admit: 2020-05-06 | Discharge: 2020-05-06 | Disposition: A | Discharge status: Home / Self Care | Payer: PRIVATE HEALTH INSURANCE | Source: Ambulatory Visit | Attending: Orthopaedic Surgery | Admitting: Orthopaedic Surgery

## 2020-05-06 ENCOUNTER — Other Ambulatory Visit: Payer: Self-pay

## 2020-05-06 DIAGNOSIS — Z01818 Encounter for other preprocedural examination: Secondary | ICD-10-CM | POA: Insufficient documentation

## 2020-05-06 LAB — COMPREHENSIVE METABOLIC PANEL
ALT: 14 U/L (ref 0–44)
AST: 19 U/L (ref 15–41)
Albumin: 3.7 g/dL (ref 3.5–5.0)
Alkaline Phosphatase: 73 U/L (ref 38–126)
Anion gap: 9 (ref 5–15)
BUN: 9 mg/dL (ref 6–20)
CO2: 28 mmol/L (ref 22–32)
Calcium: 8.7 mg/dL — ABNORMAL LOW (ref 8.9–10.3)
Chloride: 102 mmol/L (ref 98–111)
Creatinine, Ser: 0.67 mg/dL (ref 0.44–1.00)
GFR, Estimated: >60 mL/min (ref >60)
Glucose, Bld: 127 mg/dL — ABNORMAL HIGH (ref 70–99)
Potassium: 3.5 mmol/L (ref 3.5–5.1)
Sodium: 139 mmol/L (ref 135–145)
Total Bilirubin: 0.4 mg/dL (ref 0.3–1.2)
Total Protein: 6.7 g/dL (ref 6.5–8.1)

## 2020-05-06 LAB — TYPE AND SCREEN
ABO/RH(D): O POS
Antibody Screen: NEGATIVE

## 2020-05-06 LAB — URINALYSIS, ROUTINE W REFLEX MICROSCOPIC
Bilirubin Urine: NEGATIVE
Glucose, UA: NEGATIVE mg/dL
Hgb urine dipstick: NEGATIVE
Ketones, ur: NEGATIVE mg/dL
Leukocytes,Ua: NEGATIVE
Nitrite: NEGATIVE
Protein, ur: NEGATIVE mg/dL
Specific Gravity, Urine: 1.014 (ref 1.005–1.030)
pH: 6 (ref 5.0–8.0)

## 2020-05-06 LAB — SURGICAL PCR SCREEN
MRSA, PCR: NEGATIVE
Staphylococcus aureus: NEGATIVE

## 2020-05-06 LAB — CBC
HCT: 42.8 % (ref 36.0–46.0)
Hemoglobin: 13.7 g/dL (ref 12.0–15.0)
MCH: 29.4 pg (ref 26.0–34.0)
MCHC: 32 g/dL (ref 30.0–36.0)
MCV: 91.8 fL (ref 80.0–100.0)
Platelets: 316 10*3/uL (ref 150–400)
RBC: 4.66 MIL/uL (ref 3.87–5.11)
RDW: 12.1 % (ref 11.5–15.5)
WBC: 9.4 10*3/uL (ref 4.0–10.5)
nRBC: 0 % (ref 0.0–0.2)

## 2020-05-06 NOTE — Progress Notes (Signed)
PCP -  Lianne Moris Cardiologist - denies  PPM/ICD - denies Device Orders -  Rep Notified -   Chest x-ray - n/a EKG - 05/06/2020 Stress Test - denies ECHO - denies Cardiac Cath - denies  Sleep Study - denies   Patient instructed to hold all Aspirin, NSAID's, herbal medications, fish oil and vitamins 7 days prior to surgery.   ERAS Protcol -yes PRE-SURGERY Ensure or G2- ensure  COVID TEST- 05/07/2020 0845   Anesthesia review: no  Patient denies shortness of breath, fever, cough and chest pain at PAT appointment   All instructions explained to the patient, with a verbal understanding of the material. Patient agrees to go over the instructions while at home for a better understanding. Patient also instructed to self quarantine after being tested for COVID-19. The opportunity to ask questions was provided.

## 2020-05-07 ENCOUNTER — Other Ambulatory Visit (HOSPITAL_COMMUNITY)
Admission: RE | Admit: 2020-05-07 | Discharge: 2020-05-07 | Disposition: A | Discharge status: Home / Self Care | Payer: PRIVATE HEALTH INSURANCE | Source: Ambulatory Visit | Attending: Orthopaedic Surgery | Admitting: Orthopaedic Surgery

## 2020-05-07 DIAGNOSIS — Z20822 Contact with and (suspected) exposure to covid-19: Secondary | ICD-10-CM | POA: Insufficient documentation

## 2020-05-07 DIAGNOSIS — Z01812 Encounter for preprocedural laboratory examination: Secondary | ICD-10-CM | POA: Insufficient documentation

## 2020-05-07 LAB — SARS CORONAVIRUS 2 (TAT 6-24 HRS): SARS Coronavirus 2: NEGATIVE

## 2020-05-07 NOTE — H&P (Signed)
Tonya [name] is an 34 y.o. female.   Chief Complaint: Low back pain and left lower extremity radiculopathy   HPI: 34 year old white female with history of recurrent L5-S1 HNP returns.  States that she continues have on going low back pain with bilateral buttock pain and intermittent left lower extremity radiculopathy.  She has been going to physical therapy and states that left leg pain is improved.  States that back pain is having a significant negative impact on her quality of life.  She is unable to do simple things with her family, household chores and work is becoming more difficult due to her ongoing problem..  She is wanting to proceed with L5-S1 fusion as scheduled next week.   Past Medical History:  Diagnosis Date  . Cystitis    Occas  . Dizziness   . Frequent UTI   . GERD (gastroesophageal reflux disease)   . Hand tingling    And feet  . Headache(784.0)   . Hypertension    no meds, patient reports this only while pregnant, not currently  . PONV (postoperative nausea and vomiting)   . Rapid heart rate    during pregnancy only  . Seasonal allergies     Past Surgical History:  Procedure Laterality Date  . ABDOMINAL HYSTERECTOMY    . ADENOIDECTOMY     as a child  . BACK SURGERY  2012  . BREAST LUMPECTOMY Left    Benign  . LAPAROSCOPIC VAGINAL HYSTERECTOMY WITH SALPINGECTOMY Bilateral 03/18/2017   Procedure: LAPAROSCOPIC ASSISTED VAGINAL HYSTERECTOMY WITH SALPINGECTOMY;  Surgeon: Zelphia Cairo, MD;  Location: [facility];  Service: Gynecology;  Laterality: Bilateral;  NEEDS BED  . LUMBAR LAMINECTOMY Left 01/25/2019   Procedure: LEFT LUMBAR FIVE TO SACRAL ONE, RECURRENT MICRODISCECTOMY;  Surgeon: Eldred Manges, MD;  [facility];  Service: Orthopedics;  Laterality: Left;  . LUMBAR LAMINECTOMY/DECOMPRESSION MICRODISCECTOMY N/A 04/10/2019   Procedure: LEFT L5-S1 REDO MICRODISCECTOMY;  Surgeon: Kerrin Champagne, MD;  Location: [facility];  Service: Orthopedics;   Laterality: N/A;  . SHOULDER SURGERY  2008, 2009   L shoulder, acromion shave and recurrent dislocation.  Marland Kitchen SPINE SURGERY  03/06/11   lumbar disc L5  . TUBAL LIGATION Bilateral 05/07/2014   Procedure: POST PARTUM TUBAL LIGATION;  Surgeon: Zelphia Cairo, MD;  Location: [facility];  Service: Gynecology;  Laterality: Bilateral;  . VAGINAL DELIVERY  2014, 2015  . WISDOM TOOTH EXTRACTION      Family History  Problem Relation Age of Onset  . Diabetes Paternal Uncle   . Hypertension Paternal Grandmother   . Obesity Other   . Sleep apnea Other   . Hypertension Maternal Grandmother   . Hypertension Maternal Grandfather   . Diabetes Maternal Grandfather   . Stroke Maternal Grandfather   . Hypertension Paternal Grandfather   . Thrombophlebitis Paternal Grandfather   . Hyperlipidemia Paternal Grandfather    Social History:  reports that she quit smoking about 6 years ago. Her smoking use included cigarettes. She has never used smokeless tobacco. She reports current alcohol use. She reports that she does not use drugs.  Allergies:  Allergies  Allergen Reactions  . Green Dyes Hives  . Ibuprofen Other (See Comments)    Esophagitis and chest pain - pt states that its ok to take with heartburn medication    No medications prior to admission.    Results for orders placed or performed during the hospital encounter of 05/06/20 (from the past 48 hour(s))  Surgical pcr  screen     Status: None   Collection Time: 05/06/20  3:25 PM   Specimen: Nasal Mucosa; Nasal Swab  Result Value Ref Range   MRSA, PCR NEGATIVE NEGATIVE   Staphylococcus aureus NEGATIVE NEGATIVE    Comment: (NOTE) The Xpert SA Assay (FDA approved for NASAL specimens in patients 79 years of age and older), is one component of a comprehensive surveillance program. It is not intended to diagnose infection nor to guide or monitor treatment. Performed at [facility], [address] [place], [place] [address]   Type and  screen Order type and screen if day of surgery is less than 15 days from draw of preadmission visit or order morning of surgery if day of surgery is greater than 6 days from preadmission visit.     Status: None   Collection Time: 05/06/20  3:35 PM  Result Value Ref Range   ABO/RH(D) O POS    Antibody Screen NEG    Sample Expiration 05/20/2020,2359    Extend sample reason      NO TRANSFUSIONS OR PREGNANCY IN THE PAST 3 MONTHS Performed at [facility], [address] [place], [place] [address]   CBC     Status: None   Collection Time: 05/06/20  4:00 PM  Result Value Ref Range   WBC 9.4 4.0 - 10.5 K/uL   RBC 4.66 3.87 - 5.11 MIL/uL   Hemoglobin 13.7 12.0 - 15.0 g/dL   HCT 06.2 36 - 46 %   MCV 91.8 80.0 - 100.0 fL   MCH 29.4 26.0 - 34.0 pg   MCHC 32.0 30.0 - 36.0 g/dL   RDW 37.6 28.3 - 15.1 %   Platelets 316 150 - 400 K/uL   nRBC 0.0 0.0 - 0.2 %    Comment: Performed at [facility], [address] [place], [place] [address]  Comprehensive metabolic panel     Status: Abnormal   Collection Time: 05/06/20  4:00 PM  Result Value Ref Range   Sodium 139 135 - 145 mmol/L   Potassium 3.5 3.5 - 5.1 mmol/L   Chloride 102 98 - 111 mmol/L   CO2 28 22 - 32 mmol/L   Glucose, Bld 127 (H) 70 - 99 mg/dL    Comment: Glucose reference range applies only to samples taken after fasting for at least 8 hours.   BUN 9 6 - 20 mg/dL   Creatinine, Ser 7.37 0.44 - 1.00 mg/dL   Calcium 8.7 (L) 8.9 - 10.3 mg/dL   Total Protein 6.7 6.5 - 8.1 g/dL   Albumin 3.7 3.5 - 5.0 g/dL   AST 19 15 - 41 U/L   ALT 14 0 - 44 U/L   Alkaline Phosphatase 73 38 - 126 U/L   Total Bilirubin 0.4 0.3 - 1.2 mg/dL   GFR, Estimated >10 >62 mL/min    Comment: (NOTE) Calculated using the CKD-EPI Creatinine Equation (2021)    Anion gap 9 5 - 15    Comment: Performed at [facility], [address], [place], [place] [address]  Urinalysis, Routine w reflex microscopic     Status: None   Collection Time:  05/06/20  4:25 PM  Result Value Ref Range   Color, Urine YELLOW YELLOW   APPearance CLEAR CLEAR   Specific Gravity, Urine 1.014 1.005 - 1.030   pH 6.0 5.0 - 8.0   Glucose, UA NEGATIVE NEGATIVE mg/dL   Hgb urine dipstick NEGATIVE NEGATIVE   Bilirubin  Urine NEGATIVE NEGATIVE   Ketones, ur NEGATIVE NEGATIVE mg/dL   Protein, ur NEGATIVE NEGATIVE mg/dL   Nitrite NEGATIVE NEGATIVE   Leukocytes,Ua NEGATIVE NEGATIVE    Comment: Performed at [facility], [address], [place], [place] [address]   No results found.  Review of Systems  Last menstrual period 03/02/2017. Physical Exam HENT:     Head: Normocephalic and atraumatic.     Mouth/Throat:     Mouth: Mucous membranes are dry.  Eyes:     Extraocular Movements: Extraocular movements intact.     Pupils: Pupils are equal, round, and reactive to light.  Pulmonary:     Effort: Pulmonary effort is normal. No respiratory distress.  Abdominal:     General: There is no distension.  Musculoskeletal:        General: Tenderness present.  Skin:    General: Skin is warm and dry.  Neurological:     General: No focal deficit present.     Mental Status: She is alert and oriented to person, place, and time.  Psychiatric:        Mood and Affect: Mood normal.      Assessment/Plan L5-S1 HNP/stenosis, low back pain and left lower extremity radiculopathy.  Previous left L5-S1 microdiscectomy x2   We will proceed with surgery.  Surgery procedure discussed along with potential recovery time.  Advised patient that she can anticipate being out of work at least 8 to 12 weeks postop.  Her husband who is a IT sales professional will be able to take off to assist her.  If he is able to be out of work at least 4 to 6 weeks I think that would be of tremendous help to her.  They do have 2 young children ages 73 and 70.  All questions answered    Zonia Kief, PA-C 05/07/2020, 11:41 AM

## 2020-05-08 ENCOUNTER — Other Ambulatory Visit: Payer: Self-pay

## 2020-05-08 LAB — NICOTINE SCREEN, URINE: Cotinine Ql Scrn, Ur: NEGATIVE ng/mL

## 2020-05-09 NOTE — Anesthesia Preprocedure Evaluation (Addendum)
Anesthesia Evaluation  Patient identified by MRN, date of birth, ID band Patient awake    Reviewed: Allergy & Precautions, NPO status , Patient's Chart, lab work & pertinent test results  History of Anesthesia Complications (+) PONV and history of anesthetic complications  Airway Mallampati: I  TM Distance: >3 FB Neck ROM: Full    Dental no notable dental hx. (+) Teeth Intact, Dental Advisory Given   Pulmonary neg pulmonary ROS, former smoker,    Pulmonary exam normal breath sounds clear to auscultation       Cardiovascular hypertension, Pt. on medications Normal cardiovascular exam Rhythm:Regular Rate:Normal     Neuro/Psych  Headaches, negative psych ROS   GI/Hepatic Neg liver ROS, GERD  Medicated and Controlled,  Endo/Other  negative endocrine ROS  Renal/GU negative Renal ROS  negative genitourinary   Musculoskeletal  (+) Arthritis , Osteoarthritis,    Abdominal (+) + obese,   Peds negative pediatric ROS (+)  Hematology negative hematology ROS (+)   Anesthesia Other Findings Day of surgery medications reviewed with the patient.  Reproductive/Obstetrics negative OB ROS                            Anesthesia Physical  Anesthesia Plan  ASA: II  Anesthesia Plan: General   Post-op Pain Management:    Induction: Intravenous  PONV Risk Score and Plan: 4 or greater and Treatment may vary due to age or medical condition, Ondansetron, Dexamethasone, Midazolam, Scopolamine patch - Pre-op and Propofol infusion  Airway Management Planned: Oral ETT  Additional Equipment: None  Intra-op Plan:   Post-operative Plan: Extubation in OR  Informed Consent: I have reviewed the patients History and Physical, chart, labs and discussed the procedure including the risks, benefits and alternatives for the proposed anesthesia with the patient or authorized representative who has indicated his/her  understanding and acceptance.     Dental advisory given  Plan Discussed with: CRNA and Anesthesiologist  Anesthesia Plan Comments:         Anesthesia Quick Evaluation

## 2020-05-10 ENCOUNTER — Ambulatory Visit (HOSPITAL_COMMUNITY)
Admission: RE | Disposition: A | Discharge status: Home / Self Care | Payer: Self-pay | Source: Ambulatory Visit | Attending: Orthopaedic Surgery

## 2020-05-10 ENCOUNTER — Ambulatory Visit (HOSPITAL_COMMUNITY): Payer: PRIVATE HEALTH INSURANCE | Admitting: Anesthesiology

## 2020-05-10 ENCOUNTER — Encounter (HOSPITAL_COMMUNITY): Payer: Self-pay | Admitting: Orthopaedic Surgery

## 2020-05-10 ENCOUNTER — Ambulatory Visit (HOSPITAL_COMMUNITY): Payer: PRIVATE HEALTH INSURANCE

## 2020-05-10 ENCOUNTER — Other Ambulatory Visit: Payer: Self-pay

## 2020-05-10 ENCOUNTER — Observation Stay (HOSPITAL_COMMUNITY)
Admission: RE | Admit: 2020-05-10 | Discharge: 2020-05-11 | Disposition: A | Discharge status: Home / Self Care | Payer: PRIVATE HEALTH INSURANCE | Source: Ambulatory Visit | Attending: Orthopaedic Surgery | Admitting: Orthopaedic Surgery

## 2020-05-10 DIAGNOSIS — Z419 Encounter for procedure for purposes other than remedying health state, unspecified: Secondary | ICD-10-CM

## 2020-05-10 DIAGNOSIS — M48061 Spinal stenosis, lumbar region without neurogenic claudication: Secondary | ICD-10-CM | POA: Diagnosis not present

## 2020-05-10 DIAGNOSIS — I10 Essential (primary) hypertension: Secondary | ICD-10-CM | POA: Insufficient documentation

## 2020-05-10 DIAGNOSIS — M5126 Other intervertebral disc displacement, lumbar region: Principal | ICD-10-CM | POA: Diagnosis present

## 2020-05-10 DIAGNOSIS — M545 Low back pain, unspecified: Secondary | ICD-10-CM | POA: Diagnosis present

## 2020-05-10 DIAGNOSIS — Z87891 Personal history of nicotine dependence: Secondary | ICD-10-CM | POA: Diagnosis not present

## 2020-05-10 DIAGNOSIS — Z981 Arthrodesis status: Secondary | ICD-10-CM | POA: Insufficient documentation

## 2020-05-10 SURGERY — POSTERIOR LUMBAR FUSION 1 LEVEL
Anesthesia: General

## 2020-05-10 MED ORDER — METHOCARBAMOL 500 MG PO TABS: 500.0000 mg | ORAL_TABLET | Freq: Four times a day (QID) | ORAL | 0 refills | Status: DC | PRN

## 2020-05-10 MED ORDER — LIDOCAINE 2% (20 MG/ML) 5 ML SYRINGE
INTRAMUSCULAR | Status: DC | PRN
  Administered 2020-05-10: 60 mg via INTRAVENOUS

## 2020-05-10 MED ORDER — SCOPOLAMINE 1 MG/3DAYS TD PT72: 1.0000 | MEDICATED_PATCH | Freq: Once | TRANSDERMAL | Status: DC

## 2020-05-10 MED ORDER — PROPOFOL 500 MG/50ML IV EMUL
INTRAVENOUS | Status: DC | PRN
  Administered 2020-05-10: 25 ug/kg/min via INTRAVENOUS

## 2020-05-10 MED ORDER — ONDANSETRON HCL 4 MG/2ML IJ SOLN
INTRAMUSCULAR | Status: AC
  Filled 2020-05-10: qty 2

## 2020-05-10 MED ORDER — ONDANSETRON HCL 4 MG PO TABS: 4.0000 mg | ORAL_TABLET | Freq: Four times a day (QID) | ORAL | Status: DC | PRN

## 2020-05-10 MED ORDER — ORAL CARE MOUTH RINSE: 15.0000 mL | Freq: Once | OROMUCOSAL | Status: AC

## 2020-05-10 MED ORDER — DEXMEDETOMIDINE (PRECEDEX) IN NS 20 MCG/5ML (4 MCG/ML) IV SYRINGE
PREFILLED_SYRINGE | INTRAVENOUS | Status: AC
  Filled 2020-05-10: qty 5

## 2020-05-10 MED ORDER — MIDAZOLAM HCL 2 MG/2ML IJ SOLN
INTRAMUSCULAR | Status: DC | PRN
  Administered 2020-05-10: 2 mg via INTRAVENOUS

## 2020-05-10 MED ORDER — HEMOSTATIC AGENTS (NO CHARGE) OPTIME
TOPICAL | Status: DC | PRN
  Administered 2020-05-10: 1 via TOPICAL

## 2020-05-10 MED ORDER — CHLORHEXIDINE GLUCONATE 0.12 % MT SOLN
OROMUCOSAL | Status: AC
  Administered 2020-05-10: 15 mL via OROMUCOSAL
  Filled 2020-05-10: qty 15

## 2020-05-10 MED ORDER — MEPERIDINE HCL 25 MG/ML IJ SOLN: 6.2500 mg | INTRAMUSCULAR | Status: DC | PRN

## 2020-05-10 MED ORDER — PHENYLEPHRINE 40 MCG/ML (10ML) SYRINGE FOR IV PUSH (FOR BLOOD PRESSURE SUPPORT)
PREFILLED_SYRINGE | INTRAVENOUS | Status: DC | PRN
  Administered 2020-05-10: 80 ug via INTRAVENOUS
  Administered 2020-05-10: 120 ug via INTRAVENOUS

## 2020-05-10 MED ORDER — LACTATED RINGERS IV SOLN: INTRAVENOUS | Status: DC

## 2020-05-10 MED ORDER — CHLORHEXIDINE GLUCONATE 0.12 % MT SOLN: 15.0000 mL | Freq: Once | OROMUCOSAL | Status: AC

## 2020-05-10 MED ORDER — FENTANYL CITRATE (PF) 100 MCG/2ML IJ SOLN
25.0000 ug | INTRAMUSCULAR | Status: DC | PRN
  Administered 2020-05-10 (×2): 25 ug via INTRAVENOUS

## 2020-05-10 MED ORDER — KETOROLAC TROMETHAMINE 0.5 % OP SOLN
1.0000 [drp] | Freq: Four times a day (QID) | OPHTHALMIC | Status: DC
  Administered 2020-05-10 (×3): 1 [drp] via OPHTHALMIC
  Filled 2020-05-10: qty 5

## 2020-05-10 MED ORDER — ONDANSETRON HCL 4 MG/2ML IJ SOLN: 4.0000 mg | Freq: Once | INTRAMUSCULAR | Status: DC | PRN

## 2020-05-10 MED ORDER — BUPIVACAINE HCL (PF) 0.5 % IJ SOLN
INTRAMUSCULAR | Status: AC
  Filled 2020-05-10: qty 30

## 2020-05-10 MED ORDER — SODIUM CHLORIDE 0.9% FLUSH
3.0000 mL | Freq: Two times a day (BID) | INTRAVENOUS | Status: DC
  Administered 2020-05-10: 3 mL via INTRAVENOUS

## 2020-05-10 MED ORDER — DEXMEDETOMIDINE (PRECEDEX) IN NS 20 MCG/5ML (4 MCG/ML) IV SYRINGE
PREFILLED_SYRINGE | INTRAVENOUS | Status: DC | PRN
  Administered 2020-05-10 (×2): 8 ug via INTRAVENOUS
  Administered 2020-05-10: 4 ug via INTRAVENOUS

## 2020-05-10 MED ORDER — ALBUMIN HUMAN 5 % IV SOLN: INTRAVENOUS | Status: DC | PRN

## 2020-05-10 MED ORDER — METHOCARBAMOL 500 MG PO TABS
500.0000 mg | ORAL_TABLET | Freq: Four times a day (QID) | ORAL | Status: DC | PRN
  Administered 2020-05-10 – 2020-05-11 (×2): 500 mg via ORAL
  Filled 2020-05-10 (×2): qty 1

## 2020-05-10 MED ORDER — ACETAMINOPHEN 160 MG/5ML PO SOLN: 325.0000 mg | ORAL | Status: DC | PRN

## 2020-05-10 MED ORDER — FENTANYL CITRATE (PF) 250 MCG/5ML IJ SOLN
INTRAMUSCULAR | Status: AC
  Filled 2020-05-10: qty 5

## 2020-05-10 MED ORDER — ROCURONIUM BROMIDE 10 MG/ML (PF) SYRINGE
PREFILLED_SYRINGE | INTRAVENOUS | Status: AC
  Filled 2020-05-10: qty 10

## 2020-05-10 MED ORDER — METHOCARBAMOL 1000 MG/10ML IJ SOLN
500.0000 mg | Freq: Four times a day (QID) | INTRAVENOUS | Status: DC | PRN
  Filled 2020-05-10: qty 5

## 2020-05-10 MED ORDER — HYDROMORPHONE HCL 1 MG/ML IJ SOLN
1.0000 mg | INTRAMUSCULAR | Status: DC | PRN
  Administered 2020-05-10 – 2020-05-11 (×2): 1 mg via INTRAVENOUS
  Filled 2020-05-10 (×2): qty 1

## 2020-05-10 MED ORDER — ONDANSETRON HCL 4 MG/2ML IJ SOLN: 4.0000 mg | Freq: Four times a day (QID) | INTRAMUSCULAR | Status: DC | PRN

## 2020-05-10 MED ORDER — OXYCODONE HCL 5 MG PO TABS: 5.0000 mg | ORAL_TABLET | Freq: Once | ORAL | Status: DC | PRN

## 2020-05-10 MED ORDER — DEXAMETHASONE SODIUM PHOSPHATE 10 MG/ML IJ SOLN
INTRAMUSCULAR | Status: AC
  Filled 2020-05-10: qty 1

## 2020-05-10 MED ORDER — OXYCODONE-ACETAMINOPHEN 5-325 MG PO TABS
1.0000 | ORAL_TABLET | ORAL | 0 refills | Status: DC | PRN
Start: 2020-05-10 — End: 2021-06-14

## 2020-05-10 MED ORDER — BUPIVACAINE HCL (PF) 0.5 % IJ SOLN
INTRAMUSCULAR | Status: DC | PRN
  Administered 2020-05-10: 10 mL

## 2020-05-10 MED ORDER — FENTANYL CITRATE (PF) 100 MCG/2ML IJ SOLN
INTRAMUSCULAR | Status: AC
  Filled 2020-05-10: qty 2

## 2020-05-10 MED ORDER — CEFAZOLIN SODIUM-DEXTROSE 2-4 GM/100ML-% IV SOLN
2.0000 g | INTRAVENOUS | Status: AC
  Administered 2020-05-10: 2 g via INTRAVENOUS
  Filled 2020-05-10: qty 100

## 2020-05-10 MED ORDER — ACETAMINOPHEN 10 MG/ML IV SOLN
INTRAVENOUS | Status: AC
  Filled 2020-05-10: qty 100

## 2020-05-10 MED ORDER — ROCURONIUM BROMIDE 10 MG/ML (PF) SYRINGE
PREFILLED_SYRINGE | INTRAVENOUS | Status: DC | PRN
  Administered 2020-05-10: 20 mg via INTRAVENOUS
  Administered 2020-05-10: 30 mg via INTRAVENOUS
  Administered 2020-05-10: 80 mg via INTRAVENOUS

## 2020-05-10 MED ORDER — SODIUM CHLORIDE 0.9 % IV SOLN: INTRAVENOUS | Status: DC

## 2020-05-10 MED ORDER — KETOROLAC TROMETHAMINE 30 MG/ML IJ SOLN
INTRAMUSCULAR | Status: DC | PRN
  Administered 2020-05-10: 30 mg via INTRAVENOUS

## 2020-05-10 MED ORDER — LIDOCAINE HCL (PF) 2 % IJ SOLN
INTRAMUSCULAR | Status: AC
  Filled 2020-05-10: qty 5

## 2020-05-10 MED ORDER — DOCUSATE SODIUM 100 MG PO CAPS
100.0000 mg | ORAL_CAPSULE | Freq: Two times a day (BID) | ORAL | Status: DC
  Administered 2020-05-10 (×2): 100 mg via ORAL
  Filled 2020-05-10 (×2): qty 1

## 2020-05-10 MED ORDER — PHENYLEPHRINE HCL-NACL 10-0.9 MG/250ML-% IV SOLN
INTRAVENOUS | Status: DC | PRN
  Administered 2020-05-10: 25 ug/min via INTRAVENOUS

## 2020-05-10 MED ORDER — SODIUM CHLORIDE 0.9% FLUSH: 3.0000 mL | INTRAVENOUS | Status: DC | PRN

## 2020-05-10 MED ORDER — MIDAZOLAM HCL 2 MG/2ML IJ SOLN
INTRAMUSCULAR | Status: AC
  Filled 2020-05-10: qty 2

## 2020-05-10 MED ORDER — ONDANSETRON HCL 4 MG/2ML IJ SOLN
INTRAMUSCULAR | Status: DC | PRN
  Administered 2020-05-10: 4 mg via INTRAVENOUS

## 2020-05-10 MED ORDER — PROPOFOL 10 MG/ML IV BOLUS
INTRAVENOUS | Status: DC | PRN
  Administered 2020-05-10: 200 mg via INTRAVENOUS
  Administered 2020-05-10: 50 mg via INTRAVENOUS

## 2020-05-10 MED ORDER — ACETAMINOPHEN 325 MG PO TABS: 325.0000 mg | ORAL_TABLET | ORAL | Status: DC | PRN

## 2020-05-10 MED ORDER — KETOROLAC TROMETHAMINE 30 MG/ML IJ SOLN
INTRAMUSCULAR | Status: AC
  Filled 2020-05-10: qty 1

## 2020-05-10 MED ORDER — OXYCODONE HCL 5 MG PO TABS
5.0000 mg | ORAL_TABLET | ORAL | Status: DC | PRN
  Administered 2020-05-10 – 2020-05-11 (×4): 10 mg via ORAL
  Filled 2020-05-10 (×4): qty 2

## 2020-05-10 MED ORDER — POLYETHYLENE GLYCOL 3350 17 G PO PACK: 17.0000 g | PACK | Freq: Every day | ORAL | Status: DC | PRN

## 2020-05-10 MED ORDER — SCOPOLAMINE 1 MG/3DAYS TD PT72
MEDICATED_PATCH | TRANSDERMAL | Status: AC
  Administered 2020-05-10: 1.5 mg via TRANSDERMAL
  Filled 2020-05-10: qty 1

## 2020-05-10 MED ORDER — HYDROXYZINE HCL 50 MG/ML IM SOLN
50.0000 mg | Freq: Four times a day (QID) | INTRAMUSCULAR | Status: DC | PRN
  Administered 2020-05-10: 50 mg via INTRAMUSCULAR
  Filled 2020-05-10: qty 1

## 2020-05-10 MED ORDER — PHENYLEPHRINE 40 MCG/ML (10ML) SYRINGE FOR IV PUSH (FOR BLOOD PRESSURE SUPPORT)
PREFILLED_SYRINGE | INTRAVENOUS | Status: AC
  Filled 2020-05-10: qty 10

## 2020-05-10 MED ORDER — GABAPENTIN 100 MG PO CAPS
100.0000 mg | ORAL_CAPSULE | Freq: Every day | ORAL | Status: DC
  Administered 2020-05-10: 100 mg via ORAL
  Filled 2020-05-10: qty 1

## 2020-05-10 MED ORDER — 0.9 % SODIUM CHLORIDE (POUR BTL) OPTIME
TOPICAL | Status: DC | PRN
  Administered 2020-05-10: 1000 mL

## 2020-05-10 MED ORDER — THROMBIN 20000 UNITS EX SOLR
CUTANEOUS | Status: DC | PRN
  Administered 2020-05-10: 20000 [IU] via TOPICAL

## 2020-05-10 MED ORDER — PHENOL 1.4 % MT LIQD: 1.0000 | OROMUCOSAL | Status: DC | PRN

## 2020-05-10 MED ORDER — FAMOTIDINE 20 MG PO TABS
20.0000 mg | ORAL_TABLET | Freq: Every day | ORAL | Status: DC
  Administered 2020-05-10: 20 mg via ORAL
  Filled 2020-05-10: qty 1

## 2020-05-10 MED ORDER — DEXAMETHASONE SODIUM PHOSPHATE 10 MG/ML IJ SOLN
INTRAMUSCULAR | Status: DC | PRN
  Administered 2020-05-10: 10 mg via INTRAVENOUS

## 2020-05-10 MED ORDER — MENTHOL 3 MG MT LOZG: 1.0000 | LOZENGE | OROMUCOSAL | Status: DC | PRN

## 2020-05-10 MED ORDER — ACETAMINOPHEN 325 MG PO TABS: 650.0000 mg | ORAL_TABLET | ORAL | Status: DC | PRN

## 2020-05-10 MED ORDER — FENTANYL CITRATE (PF) 250 MCG/5ML IJ SOLN
INTRAMUSCULAR | Status: DC | PRN
  Administered 2020-05-10 (×3): 50 ug via INTRAVENOUS
  Administered 2020-05-10: 100 ug via INTRAVENOUS
  Administered 2020-05-10 (×2): 50 ug via INTRAVENOUS

## 2020-05-10 MED ORDER — PROPOFOL 10 MG/ML IV BOLUS
INTRAVENOUS | Status: AC
  Filled 2020-05-10: qty 40

## 2020-05-10 MED ORDER — OXYCODONE HCL 5 MG/5ML PO SOLN: 5.0000 mg | Freq: Once | ORAL | Status: DC | PRN

## 2020-05-10 MED ORDER — THROMBIN (RECOMBINANT) 20000 UNITS EX SOLR
CUTANEOUS | Status: AC
  Filled 2020-05-10: qty 20000

## 2020-05-10 MED ORDER — SUGAMMADEX SODIUM 200 MG/2ML IV SOLN
INTRAVENOUS | Status: DC | PRN
  Administered 2020-05-10: 200 mg via INTRAVENOUS

## 2020-05-10 MED ORDER — ACETAMINOPHEN 650 MG RE SUPP: 650.0000 mg | RECTAL | Status: DC | PRN

## 2020-05-10 SURGICAL SUPPLY — 69 items
ADH SKN CLS APL DERMABOND .7 (GAUZE/BANDAGES/DRESSINGS) ×1
AGENT HMST KT MTR STRL THRMB (HEMOSTASIS) ×1
BLADE CLIPPER SURG (BLADE) IMPLANT
BUR ROUND FLUTED 4 SOFT TCH (BURR) ×2 IMPLANT
CABLE BIPOLOR RESECTION CORD (MISCELLANEOUS) ×2 IMPLANT
CAP LOCKING THREADED (Cap) ×4 IMPLANT
COVER BACK TABLE 80X110 HD (DRAPES) ×2 IMPLANT
COVER SURGICAL LIGHT HANDLE (MISCELLANEOUS) ×2 IMPLANT
COVER WAND RF STERILE (DRAPES) ×2 IMPLANT
DERMABOND ADVANCED (GAUZE/BANDAGES/DRESSINGS) ×1
DERMABOND ADVANCED .7 DNX12 (GAUZE/BANDAGES/DRESSINGS) ×1 IMPLANT
DRAPE C-ARM 42X72 X-RAY (DRAPES) ×2 IMPLANT
DRAPE LAPAROTOMY T 102X78X121 (DRAPES) ×2 IMPLANT
DRAPE MICROSCOPE LEICA (MISCELLANEOUS) ×2 IMPLANT
DRAPE SURG 17X23 STRL (DRAPES) ×6 IMPLANT
DRSG EMULSION OIL 3X3 NADH (GAUZE/BANDAGES/DRESSINGS) ×2 IMPLANT
DRSG MEPILEX BORDER 4X4 (GAUZE/BANDAGES/DRESSINGS) ×2 IMPLANT
DRSG MEPILEX BORDER 4X8 (GAUZE/BANDAGES/DRESSINGS) ×2 IMPLANT
DRSG PAD ABDOMINAL 8X10 ST (GAUZE/BANDAGES/DRESSINGS) ×4 IMPLANT
DURAPREP 26ML APPLICATOR (WOUND CARE) ×2 IMPLANT
ELECT BLADE 4.0 EZ CLEAN MEGAD (MISCELLANEOUS) ×2
ELECT CAUTERY BLADE 6.4 (BLADE) ×2 IMPLANT
ELECT REM PT RETURN 9FT ADLT (ELECTROSURGICAL) ×2
ELECTRODE BLDE 4.0 EZ CLN MEGD (MISCELLANEOUS) ×1 IMPLANT
ELECTRODE REM PT RTRN 9FT ADLT (ELECTROSURGICAL) ×1 IMPLANT
EVACUATOR 1/8 PVC DRAIN (DRAIN) ×2 IMPLANT
GLOVE BIOGEL PI IND STRL 8 (GLOVE) ×2 IMPLANT
GLOVE BIOGEL PI INDICATOR 8 (GLOVE) ×2
GLOVE ORTHO TXT STRL SZ7.5 (GLOVE) ×4 IMPLANT
GOWN STRL REUS W/ TWL LRG LVL3 (GOWN DISPOSABLE) ×1 IMPLANT
GOWN STRL REUS W/ TWL XL LVL3 (GOWN DISPOSABLE) ×1 IMPLANT
GOWN STRL REUS W/TWL 2XL LVL3 (GOWN DISPOSABLE) ×2 IMPLANT
GOWN STRL REUS W/TWL LRG LVL3 (GOWN DISPOSABLE) ×2
GOWN STRL REUS W/TWL XL LVL3 (GOWN DISPOSABLE) ×2
HEMOSTAT SURGICEL 2X14 (HEMOSTASIS) IMPLANT
KIT BASIN OR (CUSTOM PROCEDURE TRAY) ×2 IMPLANT
KIT POSITION SURG JACKSON T1 (MISCELLANEOUS) IMPLANT
KIT TURNOVER KIT B (KITS) ×2 IMPLANT
MANIFOLD NEPTUNE II (INSTRUMENTS) ×1 IMPLANT
NS IRRIG 1000ML POUR BTL (IV SOLUTION) ×2 IMPLANT
PACK LAMINECTOMY ORTHO (CUSTOM PROCEDURE TRAY) ×2 IMPLANT
PAD ARMBOARD 7.5X6 YLW CONV (MISCELLANEOUS) ×4 IMPLANT
PATTIES SURGICAL .5 X.5 (GAUZE/BANDAGES/DRESSINGS) IMPLANT
PATTIES SURGICAL .75X.75 (GAUZE/BANDAGES/DRESSINGS) ×2 IMPLANT
ROD CREO 45MM SPINAL (Rod) ×2 IMPLANT
SCREW PA THRD CREO 6.5X35 (Screw) ×1 IMPLANT
SCREW POLY THRD CREO 6.5X40 (Screw) ×1 IMPLANT
SCREW SPINAL CREO 7.5X35 (Screw) ×1 IMPLANT
SCREW SPINAL CREO 7.5X40 (Screw) ×1 IMPLANT
SPACER TLIF SIGN 11 SM (Spacer) ×1 IMPLANT
SPONGE INTESTINAL PEANUT (DISPOSABLE) ×1 IMPLANT
SPONGE LAP 18X18 RF (DISPOSABLE) IMPLANT
SPONGE LAP 4X18 RFD (DISPOSABLE) ×4 IMPLANT
SPONGE SURGIFOAM ABS GEL 100 (HEMOSTASIS) ×1 IMPLANT
STAPLER VISISTAT 35W (STAPLE) IMPLANT
SURGIFLO W/THROMBIN 8M KIT (HEMOSTASIS) ×2 IMPLANT
SUT BONE WAX W31G (SUTURE) ×2 IMPLANT
SUT VIC AB 0 CT1 27 (SUTURE) ×2
SUT VIC AB 0 CT1 27XBRD ANBCTR (SUTURE) IMPLANT
SUT VIC AB 1 CTX 36 (SUTURE) ×2
SUT VIC AB 1 CTX36XBRD ANBCTR (SUTURE) ×1 IMPLANT
SUT VIC AB 2-0 CT1 27 (SUTURE) ×2
SUT VIC AB 2-0 CT1 TAPERPNT 27 (SUTURE) ×1 IMPLANT
SUT VIC AB 3-0 X1 27 (SUTURE) IMPLANT
TOWEL GREEN STERILE (TOWEL DISPOSABLE) ×2 IMPLANT
TOWEL GREEN STERILE FF (TOWEL DISPOSABLE) ×2 IMPLANT
TRAY FOLEY MTR SLVR 16FR STAT (SET/KITS/TRAYS/PACK) ×2 IMPLANT
WATER STERILE IRR 1000ML POUR (IV SOLUTION) ×2 IMPLANT
YANKAUER SUCT BULB TIP NO VENT (SUCTIONS) ×2 IMPLANT

## 2020-05-10 NOTE — Discharge Instructions (Addendum)
Ok to shower 2 days postop.  Do not apply any creams or ointments to incision.  Can use 4x4 gauze and tape for dressing changes if the dressing falls off . Your dressing is waterproof and you can shower with it on .   Lumbar brace on at all times when up and ambulating.   No driving until further notice.    Call Your Doctor If Any of These Occur Redness, drainage, or swelling at the wound.  Temperature greater than 101 degrees. Severe pain not relieved by pain medication. Incision starts to come apart.

## 2020-05-10 NOTE — Anesthesia Procedure Notes (Signed)
Procedure Name: Intubation Date/Time: 05/10/2020 7:38 AM Performed by: Rande Brunt, CRNA Pre-anesthesia Checklist: Patient identified, Emergency Drugs available, Suction available and Patient being monitored Patient Re-evaluated:Patient Re-evaluated prior to induction Oxygen Delivery Method: Circle System Utilized Preoxygenation: Pre-oxygenation with 100% oxygen Induction Type: IV induction Ventilation: Mask ventilation without difficulty Laryngoscope Size: Mac and 3 Grade View: Grade I Tube type: Oral Tube size: 7.0 mm Number of attempts: 1 Airway Equipment and Method: Stylet Placement Confirmation: ETT inserted through vocal cords under direct vision,  positive ETCO2 and breath sounds checked- equal and bilateral Secured at: 22 cm Tube secured with: Tape Dental Injury: Teeth and Oropharynx as per pre-operative assessment

## 2020-05-10 NOTE — Op Note (Signed)
Preop diagnosis: L5-S1 disc degeneration with recurrent protrusion with previous microdiscectomy x2  Postop diagnosis: Same  Procedure: L5-S1 left transforaminal interbody fusion.  Laminectomy decompression for recurrent disc protrusion microscope assisted for repeat surgery.  Pedicle instrumentation L5-S1 with local bone use for interbody fusion, 11 mm cage and lateral intertransverse process fusion.  (Single level fusion with interbody and bilateral lateral fusion.)  Surgeon: Annell Greening, MD  Assistant: Zonia Kief, PA-C medically necessary and present for the entire procedure  Anesthesia: General orotracheal plus Marcaine skin local.  EBL 425 mL.  Implants:Globus  Creo L5-S1 single level fusion pedicle instrumentation.  11 mm signature cage banana-shaped with local bone.  45 mm rods x2.  7.5x40 screw left L5, 7.5x35 left S1.  L5 right 6.5x40 and 6.5x35 right S1.  Procedure: After induction general anesthesia patient had previous microdiscectomies x2 with recurrent disc protrusion persistent pain.  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  She has failed therapy, epidural injections anti-inflammatories muscle relaxants core strengthening.  After intubation Foley catheter was placed.  Patient was placed prone on the spine frame careful positioning padding rolled pads underneath the shoulders padding the ulnar nerves leg pumpers.  1010 pressure applied x4 and DuraPrep.  What was dry timeout procedure was completed Ancef 2 g prophylaxis.  Sterile skin marker in the skin followed by Betadine Steri-Drape and lumbar sticky sheet.  Old incision was incorporated extended proximally distally one half level.  Some process section of the lamina.  Patient still had spinous process at L5 and S1 present and patient had a laminotomy on the left side but no surgery on the right side.   Kocher clamps were placed C-arm was brought in sterilely draped for localization and confirmation of bone was marked with purple marker and then L5 laminectomy was performed going up the right side fragmentary tori first using the operative microscope coming over the left side where there is extensive scar tissue from her 2 previous surgeries adherence of the nerve with compression of the left L5 nerve root as well as disc protrusion anterior to the S1 nerve root at the midline.  Bipolar cautery was used some Surgi-Flo patties were used in the gutters.  Once the nerve root was carefully retracted facet on the left side was removed for entry space to the disc.  Passes were made with cutters progressing up to 10 angled curettes rasps endplate preparation, ring curettes straight and down-biting pituitaries.  A near annular ligament was intact.  Once there was no remaining disc material able to be removed trial sizers were progressed after in PET late rasping.  We progressed up and 11 mm gave excellent fit look good on x-ray.  He was kicked over just past the midline and the cage was open packed with bone graft which was a signature banana-shaped cage.  It was impacted countersunk 2 mm would not go any further since we backed so much bone anteriorly already in the disc base from the laminectomy and facet joint that had been cleaned of soft tissue.  Impactor some compression was used and bone was well visualized on fluoroscopic imaging.  Cage was placed flush with the posterior cortex checked and then kicked over an additional 2 mm countersinking.  Endplates were parallel on AP x-ray.  Next pedicle screws were placed first on the left side.  Initially the 1 and S1  did not appear to have enough convergence the screw was backed out redirected with better convergence.  L5 was redirected as well.  There was bone anterior with the pedicle feeler tapping feeling again with the pedicle feeler decortication laterally with the  bur transverse process and then placement of pedicle screws 7.5x40 at L5 7.5x35 and the sacrum on the left.  On the right side 6.5 screws were placed 40 mm at L5 and 35 mm at S1.  45 mm rods were selected caps were applied we compress the left side first and once this was compressed switch positions with Zonia Kief, PA-C and compressed the opposite right side.  Both sides compressed down well had good stability and the C arm was angled looking directly down the pedicles to make sure that the screws were in good position.  Copious irrigation the dura was intact.  All chunks of ligament had been removed and we felt along the medial wall and inferior aspect of the pedicle all 4 areas to make sure there is no penetration of the screws.  Repeat irrigation and #1 Vicryl deep fascial closure 2 on subtendinous tissue skin staple closure postop dressing and transferred recovery room.  Patient tolerated the procedure well.

## 2020-05-10 NOTE — Anesthesia Postprocedure Evaluation (Signed)
Anesthesia Post Note  Patient: Tonya [name]  Procedure(s) Performed: LEFT LUMBAR FIVE THROUGH SACRAL ONE  TRANSFORAMINAL INTERBODY FUSION, CAGE, PEDICLE INSTRUMENTATION (N/A )     Patient location during evaluation: PACU Anesthesia Type: General Level of consciousness: awake and alert Pain management: pain level controlled Vital Signs Assessment: post-procedure vital signs reviewed and stable Respiratory status: spontaneous breathing, nonlabored ventilation, respiratory function stable and patient connected to nasal cannula oxygen Cardiovascular status: blood pressure returned to baseline and stable Postop Assessment: no apparent nausea or vomiting Anesthetic complications: no   No complications documented.  Last Vitals:  Vitals:   05/10/20 1230 05/10/20 1310  BP: 123/78 114/67  Pulse: 64 71  Resp: 11 18  Temp: 36.8 C 37 C  SpO2: 100% 96%    Last Pain:  Vitals:   05/10/20 1310  TempSrc: Oral  PainSc:                  Laurie Penado

## 2020-05-10 NOTE — Transfer of Care (Signed)
Immediate Anesthesia Transfer of Care Note  Patient: Tonya [name]  Procedure(s) Performed: LEFT LUMBAR FIVE THROUGH SACRAL ONE  TRANSFORAMINAL INTERBODY FUSION, CAGE, PEDICLE INSTRUMENTATION (N/A )  Patient Location: PACU  Anesthesia Type:General  Level of Consciousness: awake and drowsy  Airway & Oxygen Therapy: Patient Spontanous Breathing and Patient connected to face mask oxygen  Post-op Assessment: Report given to RN, Post -op Vital signs reviewed and stable and Patient moving all extremities  Post vital signs: Reviewed and stable  Last Vitals:  Vitals Value Taken Time  BP 124/69 05/10/20 1146  Temp    Pulse 76 05/10/20 1148  Resp 12 05/10/20 1148  SpO2 99 % 05/10/20 1148  Vitals shown include unvalidated device data.  Last Pain:  Vitals:   05/10/20 0637  TempSrc:   PainSc: 6          Complications: No complications documented.

## 2020-05-10 NOTE — Progress Notes (Signed)
Orthopedic Tech Progress Note Patient Details:  Tonya [name] Dec 01, 1985 [id number] Called in order to [facility] for an ASPEN LUMBAR BRACE Patient ID: Tonya [name], female   DOB: 1986-04-25, 34 y.o.   MRN: [id number]   [name] 05/10/2020, 2:02 PM

## 2020-05-10 NOTE — Interval H&P Note (Signed)
History and Physical Interval Note:  05/10/2020 7:24 AM  Tonya [name]  has presented today for surgery, with the diagnosis of L5-S1 recurrent disc protrusion.  The various methods of treatment have been discussed with the patient and family. After consideration of risks, benefits and other options for treatment, the patient has consented to  Procedure(s): LEFT L5-S1 TRANSFORAMINAL INTERBODY FUSION, CAGE, PEDICLE INSTRUMENTATION (N/A) as a surgical intervention.  The patient's history has been reviewed, patient examined, no change in status, stable for surgery.  I have reviewed the patient's chart and labs.  Questions were answered to the patient's satisfaction.     Eldred Manges

## 2020-05-11 DIAGNOSIS — M5126 Other intervertebral disc displacement, lumbar region: Secondary | ICD-10-CM | POA: Diagnosis not present

## 2020-05-11 LAB — CBC
HCT: 34.2 % — ABNORMAL LOW (ref 36.0–46.0)
Hemoglobin: 11.6 g/dL — ABNORMAL LOW (ref 12.0–15.0)
MCH: 30.3 pg (ref 26.0–34.0)
MCHC: 33.9 g/dL (ref 30.0–36.0)
MCV: 89.3 fL (ref 80.0–100.0)
Platelets: 276 10*3/uL (ref 150–400)
RBC: 3.83 MIL/uL — ABNORMAL LOW (ref 3.87–5.11)
RDW: 12.1 % (ref 11.5–15.5)
WBC: 15.8 10*3/uL — ABNORMAL HIGH (ref 4.0–10.5)
nRBC: 0 % (ref 0.0–0.2)

## 2020-05-11 LAB — BASIC METABOLIC PANEL
Anion gap: 11 (ref 5–15)
BUN: 8 mg/dL (ref 6–20)
CO2: 26 mmol/L (ref 22–32)
Calcium: 8.8 mg/dL — ABNORMAL LOW (ref 8.9–10.3)
Chloride: 102 mmol/L (ref 98–111)
Creatinine, Ser: 0.73 mg/dL (ref 0.44–1.00)
GFR, Estimated: >60 mL/min (ref >60)
Glucose, Bld: 116 mg/dL — ABNORMAL HIGH (ref 70–99)
Potassium: 3.9 mmol/L (ref 3.5–5.1)
Sodium: 139 mmol/L (ref 135–145)

## 2020-05-11 NOTE — Progress Notes (Signed)
Patient is discharged from room 3C05 at this time. Alert and in stable condition. IV site d/c'd and instructions read to patient with understanding verbalized and all questions answered. Left unit via wheelchair with all belongings at side.  

## 2020-05-11 NOTE — Evaluation (Signed)
Occupational Therapy Evaluation Patient Details Name: Tonya [name] MRN: [id number] DOB: June 21, 1985 Today's Date: 05/11/2020    History of Present Illness 34 year old white female with history of recurrent L5-S1 HNP.  PMH inclues: HTN, GERD, and UTI.  Underwent: LEFT L5-S1 TRANSFORAMINAL INTERBODY FUSION, CAGE, PEDICLE INSTRUMENTATION.   Clinical Impression   Patient admitted with the above diagnosis and procedure.  Main complaint is low back discomfort post surgery.  At home she is independent with all care and mobility, and continues to work full time.  She will have assist as needed initially.  She is close to her baseline for ADL and mobility, and demonstrates good follow through with all teachings.  No further OT needs in the acute setting.      Follow Up Recommendations  No OT follow up;Supervision - Intermittent    Equipment Recommendations  None recommended by OT    Recommendations for Other Services       Precautions / Restrictions Precautions Precautions: Back Precaution Booklet Issued: Yes (comment) Required Braces or Orthoses: Spinal Brace Spinal Brace: Lumbar corset Restrictions Weight Bearing Restrictions: No      Mobility Bed Mobility Overal bed mobility: Modified Independent             General bed mobility comments: slowed pace    Transfers Overall transfer level: Modified independent               General transfer comment: slowed pace - reaches for objects in her environment.    Balance Overall balance assessment: No apparent balance deficits (not formally assessed)                                         ADL either performed or assessed with clinical judgement   ADL Overall ADL's : Modified independent                                       General ADL Comments: Able to use hip kit and modified positions sit/stand for all ADL.     Vision Patient Visual Report: No change from baseline        Perception     Praxis      Pertinent Vitals/Pain Pain Assessment: Faces Faces Pain Scale: Hurts little more Pain Location: in sitting without brace Pain Descriptors / Indicators: Grimacing Pain Intervention(s): Monitored during session     Hand Dominance Right   Extremity/Trunk Assessment Upper Extremity Assessment Upper Extremity Assessment: Overall WFL for tasks assessed   Lower Extremity Assessment Lower Extremity Assessment: Defer to PT evaluation   Cervical / Trunk Assessment Cervical / Trunk Assessment: Normal   Communication Communication Communication: No difficulties   Cognition Arousal/Alertness: Awake/alert Behavior During Therapy: WFL for tasks assessed/performed Overall Cognitive Status: Within Functional Limits for tasks assessed                                                Shoulder Instructions      Home Living Family/patient expects to be discharged to:: Private residence Living Arrangements: Spouse/significant other Available Help at Discharge: Family Type of Home: House Home Access: Stairs to enter [employer] of Steps: 4 Entrance Stairs-Rails: Can reach both  Home Layout: One level     Bathroom Shower/Tub: Occupational psychologist: Standard     Home Equipment: Adaptive equipment;Hand held shower head;Shower seat - built in;Shower Theme park manager: Reacher;Sock aid        Prior Functioning/Environment Level of Independence: Independent        Comments: continues to work [employer]        OT Problem List: Pain      OT Treatment/Interventions:      OT Goals(Current goals can be found in the care plan section) Acute Rehab OT Goals Patient Stated Goal: Play with my kids OT Goal Formulation: With patient Time For Goal Achievement: 05/11/20 Potential to Achieve Goals: Good  OT Frequency:     Barriers to D/C:  none          Co-evaluation              AM-PAC OT "6 Clicks" Daily  Activity     Outcome Measure Help from another person eating meals?: None Help from another person taking care of personal grooming?: None Help from another person toileting, which includes using toliet, bedpan, or urinal?: None Help from another person bathing (including washing, rinsing, drying)?: None Help from another person to put on and taking off regular upper body clothing?: None Help from another person to put on and taking off regular lower body clothing?: None 6 Click Score: 24   End of Session Equipment Utilized During Treatment: Back brace Nurse Communication: Mobility status  Activity Tolerance: Patient tolerated treatment well Patient left: in bed  OT Visit Diagnosis: Pain Pain - Right/Left:  (low back)                Time: 3383-2919 OT Time Calculation (min): 20 min Charges:  OT General Charges $OT Visit: 1 Visit OT Evaluation $OT Eval Low Complexity: 1 Low  05/11/2020  Rich, OTR/L  Acute Rehabilitation Services  Office:  [phone]   Metta Clines 05/11/2020, 8:22 AM

## 2020-05-11 NOTE — Evaluation (Signed)
Physical Therapy Evaluation Patient Details Name: Tonya [name] MRN: [id number] DOB: 05-22-1986 Today's Date: 05/11/2020   History of Present Illness  Pt is a 34 y/o female with a PMH significant for HTN, frequent UTI, dizziness, cyctitis, prior microdiscectomy x2. She presents s/p L5-S1 TLIF on 05/10/2020.   Clinical Impression  Pt in bed upon arrival of PT, agreeable to evaluation at this time. Prior to admission the pt was completely independent with mobility, but limited in endurance by onset of pain with continued activity. The pt now presents with minor limitations in functional mobility, dynamic stability, and endurance due to above dx and resulting pain, but is safe to return home with family support when cleared medically. The pt was able do demo good independence and mobility with bed mobility, transfers, hallway ambulation, and stair training without need for physical assistance or AD. She does ambulate with slowed gait at this time, and reach for objects intermittently for support, but was able to tolerate most mobility with minor or no evidence of instability. The pt was agreeable to all education regarding spinal precautions, brace application, walking program, and ergonomics for working from home. The pt has no further acute PT needs, but will benefit from continuation of OPPT when cleared for targeted strengthening and dynamic stability to facilitate full return to prior level of function and pt goals of being able to play with her kids.     Follow Up Recommendations Supervision for mobility/OOB (resume OPPT when cleared)    Equipment Recommendations  None recommended by PT    Recommendations for Other Services       Precautions / Restrictions Precautions Precautions: Back Precaution Booklet Issued: Yes (comment) Precaution Comments: able to recall 3/3 and apply to mobility well Required Braces or Orthoses: Spinal Brace Spinal Brace: Lumbar corset Restrictions Weight  Bearing Restrictions: No      Mobility  Bed Mobility Overal bed mobility: Modified Independent             General bed mobility comments: cues for log roll and use of hand rail, but able to complete without assist    Transfers Overall transfer level: Modified independent Equipment used: None             General transfer comment: slowed pace - reaches for objects in her environment.  Ambulation/Gait Ambulation/Gait assistance: Supervision Gait Distance (Feet): 350 Feet Assistive device: None Gait Pattern/deviations: WFL(Within Functional Limits) Gait velocity: 0.67 m/s Gait velocity interpretation: 1.31 - 2.62 ft/sec, indicative of limited community ambulator General Gait Details: slightly decreased stride length, but generally able to walk with stable gait  Stairs Stairs: Yes Stairs assistance: Supervision Stair Management: One rail Right;Alternating pattern;Forwards Number of Stairs: 10 General stair comments: no evidence of instability      Balance Overall balance assessment: No apparent balance deficits (not formally assessed)                                           Pertinent Vitals/Pain Pain Assessment: Faces Faces Pain Scale: Hurts a little bit Pain Location: in sitting without brace Pain Descriptors / Indicators: Grimacing Pain Intervention(s): Limited activity within patient's tolerance;Monitored during session    Home Living Family/patient expects to be discharged to:: Private residence Living Arrangements: Spouse/significant other Available Help at Discharge: Family Type of Home: House Home Access: Stairs to enter Entrance Stairs-Rails: Can reach both Entrance Stairs-Number of Steps: 4  Home Layout: One level Home Equipment: Adaptive equipment;Hand held shower head;Shower seat - built in;Shower seat      Prior Function Level of Independence: Independent         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, on Health and safety inspector Dominance   Dominant Hand: Right    Extremity/Trunk Assessment   Upper Extremity Assessment Upper Extremity Assessment: Defer to OT evaluation    Lower Extremity Assessment Lower Extremity Assessment: Overall WFL for tasks assessed (pt denies difference in sensation between LE)    Cervical / Trunk Assessment Cervical / Trunk Assessment: Other exceptions Cervical / Trunk Exceptions: s/p spinal surgery  Communication   Communication: No difficulties  Cognition Arousal/Alertness: Awake/alert Behavior During Therapy: WFL for tasks assessed/performed Overall Cognitive Status: Within Functional Limits for tasks assessed                                        General Comments General comments (skin integrity, edema, etc.): VSS     Exercises     Assessment/Plan    PT Assessment All further PT needs can be met in the next venue of care  PT Problem List Decreased activity tolerance;Decreased balance;Pain       PT Treatment Interventions      PT Goals (Current goals can be found in the Care Plan section)  Acute Rehab PT Goals Patient Stated Goal: Play with my kids PT Goal Formulation: With patient Time For Goal Achievement: 05/25/20 Potential to Achieve Goals: Good     AM-PAC PT "6 Clicks" Mobility  Outcome Measure Help needed turning from your back to your side while in a flat bed without using bedrails?: None Help needed moving from lying on your back to sitting on the side of a flat bed without using bedrails?: None Help needed moving to and from a bed to a chair (including a wheelchair)?: None Help needed standing up from a chair using your arms (e.g., wheelchair or bedside chair)?: None Help needed to walk in hospital room?: None Help needed climbing 3-5 steps with a railing? : A Little 6 Click Score: 23    End of Session Equipment Utilized During Treatment: Gait belt;Back brace Activity Tolerance: Patient tolerated treatment  well Patient left: in bed;with call bell/phone within reach (sitting EOB) Nurse Communication: Mobility status PT Visit Diagnosis: Pain Pain - part of body:  (back)    Time: 7517-0017 PT Time Calculation (min) (ACUTE ONLY): 20 min   Charges:   PT Evaluation $PT Eval Low Complexity: 1 Low          Karma Ganja, PT, DPT   Acute Rehabilitation Department Pager #: [phone]  Otho Bellows 05/11/2020, 9:54 AM

## 2020-05-11 NOTE — Progress Notes (Signed)
   Subjective: 1 Day Post-Op Procedure(s) (LRB): LEFT LUMBAR FIVE THROUGH SACRAL ONE  TRANSFORAMINAL INTERBODY FUSION, CAGE, PEDICLE INSTRUMENTATION (N/A) Patient reports pain as moderate.   Incisional pain. Walking in hall with brace.  Objective: Vital signs in last 24 hours: Temp:  [98 F (36.7 C)-99 F (37.2 C)] 99 F (37.2 C) (12/04 0845) Pulse Rate:  [62-98] 83 (12/04 0845) Resp:  [10-20] 18 (12/04 0845) BP: (114-140)/(67-85) 135/75 (12/04 0845) SpO2:  [96 %-100 %] 99 % (12/04 0845)  Intake/Output from previous day: 12/03 0701 - 12/04 0700 In: 1250 [I.V.:1000; IV Piggyback:250] Out: 775 [Urine:350; Blood:425] Intake/Output this shift: No intake/output data recorded.  Recent Labs    05/11/20 0458  HGB 11.6*   Recent Labs    05/11/20 0458  WBC 15.8*  RBC 3.83*  HCT 34.2*  PLT 276   Recent Labs    05/11/20 0458  NA 139  K 3.9  CL 102  CO2 26  BUN 8  CREATININE 0.73  GLUCOSE 116*  CALCIUM 8.8*   No results for input(s): LABPT, INR in the last 72 hours.  Neurologically intact DG Lumbar Spine 2-3 Views  Result Date: 05/10/2020 CLINICAL DATA:  Surgery, elective. Additional history provided: Left lumbar 5 through sacral 1 transforaminal interbody fusion, cage, pedicle instrumentation. Provided fluoroscopy time 49.4 seconds (31.69 mGy). EXAM: LUMBAR SPINE - 2-3 VIEW; DG C-ARM 1-60 MIN COMPARISON:  Lumbar spine MRI 01/18/2020. FINDINGS: PA and lateral view intraoperative fluoroscopic images of the lumbar spine are submitted, 3 images total. The images demonstrate findings of posterior decompression and posterior spinal fusion at the L5-S1 level. The posterior spinal fusion construct utilizes bilateral pedicle screws and vertical interconnecting rods. An interbody device is also present at L5-S1. IMPRESSION: Three intraoperative fluoroscopic images of the lumbar spine from L5-S1 decompression and posterior fusion as described. Electronically Signed   By: Jackey Loge  DO   On: 05/10/2020 13:23   DG C-Arm 1-60 Min  Result Date: 05/10/2020 CLINICAL DATA:  Surgery, elective. Additional history provided: Left lumbar 5 through sacral 1 transforaminal interbody fusion, cage, pedicle instrumentation. Provided fluoroscopy time 49.4 seconds (31.69 mGy). EXAM: LUMBAR SPINE - 2-3 VIEW; DG C-ARM 1-60 MIN COMPARISON:  Lumbar spine MRI 01/18/2020. FINDINGS: PA and lateral view intraoperative fluoroscopic images of the lumbar spine are submitted, 3 images total. The images demonstrate findings of posterior decompression and posterior spinal fusion at the L5-S1 level. The posterior spinal fusion construct utilizes bilateral pedicle screws and vertical interconnecting rods. An interbody device is also present at L5-S1. IMPRESSION: Three intraoperative fluoroscopic images of the lumbar spine from L5-S1 decompression and posterior fusion as described. Electronically Signed   By: Jackey Loge DO   On: 05/10/2020 13:23    Assessment/Plan: 1 Day Post-Op Procedure(s) (LRB): LEFT LUMBAR FIVE THROUGH SACRAL ONE  TRANSFORAMINAL INTERBODY FUSION, CAGE, PEDICLE INSTRUMENTATION (N/A)  Plan: dressing change and discharge home. Office F/U  05/21/20  Tonya [name] 05/11/2020, 9:17 AM

## 2020-05-14 MED FILL — Thrombin (Recombinant) For Soln 20000 Unit: CUTANEOUS | Qty: 1 | Status: AC

## 2020-05-14 NOTE — Discharge Summary (Addendum)
Patient ID: ANAE [name] MRN: [id number] DOB/AGE: 34/06/1985 34 y.o.  Admit date: 05/10/2020 Discharge date: 05/11/2020 Admission Diagnoses:  Active Problems:   Protrusion of lumbar intervertebral disc   Recurrent herniation of lumbar disc   Lumbar stenosis   Discharge Diagnoses:  Active Problems:   Protrusion of lumbar intervertebral disc   Recurrent herniation of lumbar disc   Lumbar stenosis  status post Procedure(s): LEFT LUMBAR FIVE THROUGH SACRAL ONE  TRANSFORAMINAL INTERBODY FUSION, CAGE, PEDICLE INSTRUMENTATION  Past Medical History:  Diagnosis Date  . Cystitis    Occas  . Dizziness   . Frequent UTI   . GERD (gastroesophageal reflux disease)   . Hand tingling    And feet  . Headache(784.0)   . Hypertension    no meds, patient reports this only while pregnant, not currently  . PONV (postoperative nausea and vomiting)   . Rapid heart rate    during pregnancy only  . Seasonal allergies     Surgeries: Procedure(s): LEFT LUMBAR FIVE THROUGH SACRAL ONE  TRANSFORAMINAL INTERBODY FUSION, CAGE, PEDICLE INSTRUMENTATION on 05/10/2020   Consultants:   Discharged Condition: Improved  Hospital Course: Tonya [name] is an 34 y.o. female who was admitted 05/10/2020 for operative treatment of L5-S1 HNP/stenosis. Patient failed conservative treatments (please see the history and physical for the specifics) and had severe unremitting pain that affects sleep, daily activities and work/hobbies. After pre-op clearance, the patient was taken to the operating room on 05/10/2020 and underwent  Procedure(s): LEFT LUMBAR FIVE THROUGH SACRAL ONE  TRANSFORAMINAL INTERBODY FUSION, CAGE, PEDICLE INSTRUMENTATION.    Patient was given perioperative antibiotics:  Anti-infectives (From admission, onward)   Start     Dose/Rate Route Frequency Ordered Stop   05/10/20 0600  ceFAZolin (ANCEF) IVPB 2g/100 mL premix        2 g 200 mL/hr over 30 Minutes Intravenous On call to O.R. 05/10/20  0549 05/10/20 0740       Patient was given sequential compression devices and early ambulation to prevent DVT.   Patient benefited maximally from hospital stay and there were no complications. At the time of discharge, the patient was urinating/moving their bowels without difficulty, tolerating a regular diet, pain is controlled with oral pain medications and they have been cleared by PT/OT.   Recent vital signs: No data found.   Recent laboratory studies: No results for input(s): WBC, HGB, HCT, PLT, NA, K, CL, CO2, BUN, CREATININE, GLUCOSE, INR, CALCIUM in the last 72 hours.  Invalid input(s): PT, 2   Discharge Medications:   Allergies as of 05/11/2020      Reactions   Green Dyes Hives   Ibuprofen Other (See Comments)   Esophagitis and chest pain - pt states that its ok to take with heartburn medication      Medication List    STOP taking these medications   acetaminophen 325 MG tablet Commonly known as: TYLENOL   HYDROcodone-acetaminophen 5-325 MG tablet Commonly known as: NORCO/VICODIN     TAKE these medications   gabapentin 100 MG capsule Commonly known as: NEURONTIN Take 1 capsule (100 mg total) by mouth 3 (three) times daily. What changed: when to take this   methocarbamol 500 MG tablet Commonly known as: Robaxin Take 1 tablet (500 mg total) by mouth every 6 (six) hours as needed for muscle spasms. What changed: when to take this   oxyCODONE-acetaminophen 5-325 MG tablet Commonly known as: PERCOCET/ROXICET Take 1-2 tablets by mouth every 4 (four) hours as needed  for severe pain.   ranitidine 75 MG tablet Commonly known as: ZANTAC Take 75 mg by mouth as needed for heartburn. Take with ibuprofen in the mornings as needed for headaches       Diagnostic Studies: DG Lumbar Spine 2-3 Views  Result Date: 05/10/2020 CLINICAL DATA:  Surgery, elective. Additional history provided: Left lumbar 5 through sacral 1 transforaminal interbody fusion, cage, pedicle  instrumentation. Provided fluoroscopy time 49.4 seconds (31.69 mGy). EXAM: LUMBAR SPINE - 2-3 VIEW; DG C-ARM 1-60 MIN COMPARISON:  Lumbar spine MRI 01/18/2020. FINDINGS: PA and lateral view intraoperative fluoroscopic images of the lumbar spine are submitted, 3 images total. The images demonstrate findings of posterior decompression and posterior spinal fusion at the L5-S1 level. The posterior spinal fusion construct utilizes bilateral pedicle screws and vertical interconnecting rods. An interbody device is also present at L5-S1. IMPRESSION: Three intraoperative fluoroscopic images of the lumbar spine from L5-S1 decompression and posterior fusion as described. Electronically Signed   By: Jackey Loge DO   On: 05/10/2020 13:23   DG C-Arm 1-60 Min  Result Date: 05/10/2020 CLINICAL DATA:  Surgery, elective. Additional history provided: Left lumbar 5 through sacral 1 transforaminal interbody fusion, cage, pedicle instrumentation. Provided fluoroscopy time 49.4 seconds (31.69 mGy). EXAM: LUMBAR SPINE - 2-3 VIEW; DG C-ARM 1-60 MIN COMPARISON:  Lumbar spine MRI 01/18/2020. FINDINGS: PA and lateral view intraoperative fluoroscopic images of the lumbar spine are submitted, 3 images total. The images demonstrate findings of posterior decompression and posterior spinal fusion at the L5-S1 level. The posterior spinal fusion construct utilizes bilateral pedicle screws and vertical interconnecting rods. An interbody device is also present at L5-S1. IMPRESSION: Three intraoperative fluoroscopic images of the lumbar spine from L5-S1 decompression and posterior fusion as described. Electronically Signed   By: Jackey Loge DO   On: 05/10/2020 13:23    Discharge Instructions    Incentive spirometry RT   Complete by: As directed        Follow-up Information    Schedule an appointment as soon as possible for a visit with Eldred Manges, MD.   Specialty: Orthopedic Surgery Why: need return office visit 2 weeks postop.   call to schedule appointment Contact information: [address] [place] [address] [phone]               Discharge Plan:  discharge to home  Disposition:     Signed: Zonia Kief  05/14/2020, 2:39 PM

## 2020-05-21 ENCOUNTER — Ambulatory Visit (INDEPENDENT_AMBULATORY_CARE_PROVIDER_SITE_OTHER): Payer: PRIVATE HEALTH INSURANCE | Admitting: Orthopaedic Surgery

## 2020-05-21 ENCOUNTER — Other Ambulatory Visit: Payer: Self-pay

## 2020-05-21 ENCOUNTER — Ambulatory Visit (INDEPENDENT_AMBULATORY_CARE_PROVIDER_SITE_OTHER): Payer: PRIVATE HEALTH INSURANCE

## 2020-05-21 VITALS — BP 125/86 | HR 99 | Ht 66.0 in | Wt 215.6 lb

## 2020-05-21 DIAGNOSIS — M5126 Other intervertebral disc displacement, lumbar region: Secondary | ICD-10-CM | POA: Diagnosis not present

## 2020-05-21 MED ORDER — HYDROCODONE-ACETAMINOPHEN 5-325 MG PO TABS
1.0000 | ORAL_TABLET | Freq: Four times a day (QID) | ORAL | 0 refills | Status: DC | PRN
Start: 2020-05-21 — End: 2021-06-14

## 2020-05-21 NOTE — Progress Notes (Signed)
Post-Op Visit Note   Patient: Tonya [name]           Date of Birth: [birth year]           MRN: [id number] Visit Date: 05/21/2020 PCP: Lianne Moris, PA-C   Assessment & Plan: Postop 1 level fusion x-rays look good incision looks good staples harvested Steri-Strips applied.  Chief Complaint:  Chief Complaint  Patient presents with  . Lower Back - Routine Post Op   Visit Diagnoses:  1. Recurrent displacement of lumbar disc     Plan: Return 5 weeks.  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  I plan to recheck her in 5 weeks should gradually increase her walking.  She states her leg feels good.  Follow-Up Instructions: No follow-ups on file.   Orders:  Orders Placed This Encounter  Procedures  . XR Lumbar Spine 2-3 Views   No orders of the defined types were placed in this encounter.   Imaging: No results found.  PMFS History: Patient Active Problem List   Diagnosis Date Noted  . Lumbar stenosis 05/10/2020  . S/P lumbar fusion 05/10/2020  . Other intervertebral disc degeneration, lumbar region 03/19/2020  . Recurrent herniation of lumbar disc 04/10/2019    Class: Acute  . Surgery follow-up 04/10/2019  . HNP (herniated nucleus pulposus), lumbar 01/25/2019  . Protrusion of lumbar intervertebral disc 12/13/2018  . Menorrhagia 03/18/2017  . Status post induction of labor 05/07/2014  . SVD (spontaneous vaginal delivery) 05/07/2014  . Near syncope 01/15/2014  . Pregnancy 01/15/2014  . URI 04/01/2010  . COMMON MIGRAINE 12/17/2006  . HYPERTENSION 12/17/2006   Past Medical History:  Diagnosis Date  . Cystitis    Occas  . Dizziness   . Frequent UTI   . GERD (gastroesophageal reflux disease)   . Hand tingling    And feet  . Headache(784.0)   . Hypertension    no meds, patient reports this only while pregnant, not currently  . PONV  (postoperative nausea and vomiting)   . Rapid heart rate    during pregnancy only  . Seasonal allergies     Family History  Problem Relation Age of Onset  . Diabetes Paternal Uncle   . Hypertension Paternal Grandmother   . Obesity Other   . Sleep apnea Other   . Hypertension Maternal Grandmother   . Hypertension Maternal Grandfather   . Diabetes Maternal Grandfather   . Stroke Maternal Grandfather   . Hypertension Paternal Grandfather   . Thrombophlebitis Paternal Grandfather   . Hyperlipidemia Paternal Grandfather     Past Surgical History:  Procedure Laterality Date  . ABDOMINAL HYSTERECTOMY    . ADENOIDECTOMY     as a child  . BACK SURGERY  2012  . BREAST LUMPECTOMY Left    Benign  . LAPAROSCOPIC VAGINAL HYSTERECTOMY WITH SALPINGECTOMY Bilateral 03/18/2017   Procedure: LAPAROSCOPIC ASSISTED VAGINAL HYSTERECTOMY WITH SALPINGECTOMY;  Surgeon: Zelphia Cairo, MD;  Location: [facility];  Service: Gynecology;  Laterality: Bilateral;  NEEDS BED  . LUMBAR LAMINECTOMY Left 01/25/2019   Procedure: LEFT LUMBAR FIVE TO SACRAL ONE, RECURRENT MICRODISCECTOMY;  Surgeon: Eldred Manges, MD;  Location: [facility];  Service: Orthopedics;  Laterality: Left;  . LUMBAR LAMINECTOMY/DECOMPRESSION MICRODISCECTOMY N/A 04/10/2019   Procedure: LEFT L5-S1 REDO MICRODISCECTOMY;  Surgeon: Kerrin Champagne, MD;  Location:  [facility] OR;  Service: Orthopedics;  Laterality: N/A;  . SHOULDER SURGERY  2008, 2009   L shoulder, acromion shave and recurrent dislocation.  Marland Kitchen SPINE SURGERY  03/06/11   lumbar disc L5  . TUBAL LIGATION Bilateral 05/07/2014   Procedure: POST PARTUM TUBAL LIGATION;  Surgeon: Zelphia Cairo, MD;  Location: [facility];  Service: Gynecology;  Laterality: Bilateral;  . VAGINAL DELIVERY  2014, 2015  . WISDOM TOOTH EXTRACTION     Social History   Occupational History  . Not on file  Tobacco Use  . Smoking status: Former Smoker    Types: Cigarettes    Quit date: 2015    Years since  quitting: 6.9  . Smokeless tobacco: Never Used  Vaping Use  . Vaping Use: Former  . Quit date: 03/06/2020  Substance and Sexual Activity  . Alcohol use: Yes    Alcohol/week: 0.0 standard drinks    Comment: occasional  . Drug use: No  . Sexual activity: Not Currently    Birth control/protection: None    Comment: Hysterectomy

## 2020-06-05 ENCOUNTER — Ambulatory Visit: Payer: PRIVATE HEALTH INSURANCE | Admitting: Rehabilitative and Restorative Service Providers"

## 2020-06-26 ENCOUNTER — Ambulatory Visit (INDEPENDENT_AMBULATORY_CARE_PROVIDER_SITE_OTHER): Payer: PRIVATE HEALTH INSURANCE | Admitting: Orthopaedic Surgery

## 2020-06-26 ENCOUNTER — Encounter: Payer: Self-pay | Admitting: Orthopaedic Surgery

## 2020-06-26 ENCOUNTER — Other Ambulatory Visit: Payer: Self-pay

## 2020-06-26 VITALS — BP 155/101 | Ht 66.0 in | Wt 215.6 lb

## 2020-06-26 DIAGNOSIS — Z981 Arthrodesis status: Secondary | ICD-10-CM

## 2020-06-26 NOTE — Progress Notes (Signed)
Post-Op Visit Note   Patient: Tonya [name]           Date of Birth: Nov 08, 1985           MRN: [id number] Visit Date: 06/26/2020 PCP: Lianne Moris, PA-C   Assessment & Plan: Post L5-S1 fusion for recurrent disc herniation.  Good relief of leg pain.  She gets up with a timer every 2030 minutes walks around some and sits back down and is been working from home half days.  Work slip given going from 4 to 6 hours starting on February 1 and next 3 weeks she can progress to 8 hours/day working from home.  I plan to recheck her in 2 months we will repeat AP lateral lumbar x-rays on return.  Chief Complaint:  Chief Complaint  Patient presents with  . Lower Back - Follow-up   Visit Diagnoses:  1. S/P lumbar fusion     Plan: R OV 2 months x-rays as above  Follow-Up Instructions: Return in about 2 months (around 08/24/2020).   Orders:  No orders of the defined types were placed in this encounter.  No orders of the defined types were placed in this encounter.   Imaging: No results found.  PMFS History: Patient Active Problem List   Diagnosis Date Noted  . Lumbar stenosis 05/10/2020  . S/P lumbar fusion 05/10/2020  . Other intervertebral disc degeneration, lumbar region 03/19/2020  . Surgery follow-up 04/10/2019  . Protrusion of lumbar intervertebral disc 12/13/2018  . Menorrhagia 03/18/2017  . Status post induction of labor 05/07/2014  . SVD (spontaneous vaginal delivery) 05/07/2014  . Near syncope 01/15/2014  . Pregnancy 01/15/2014  . URI 04/01/2010  . COMMON MIGRAINE 12/17/2006  . HYPERTENSION 12/17/2006   Past Medical History:  Diagnosis Date  . Cystitis    Occas  . Dizziness   . Frequent UTI   . GERD (gastroesophageal reflux disease)   . Hand tingling    And feet  . Headache(784.0)   . Hypertension    no meds, patient reports this only while pregnant, not currently  . PONV (postoperative nausea and vomiting)   . Rapid heart rate    during pregnancy only  .  Seasonal allergies     Family History  Problem Relation Age of Onset  . Diabetes Paternal Uncle   . Hypertension Paternal Grandmother   . Obesity Other   . Sleep apnea Other   . Hypertension Maternal Grandmother   . Hypertension Maternal Grandfather   . Diabetes Maternal Grandfather   . Stroke Maternal Grandfather   . Hypertension Paternal Grandfather   . Thrombophlebitis Paternal Grandfather   . Hyperlipidemia Paternal Grandfather     Past Surgical History:  Procedure Laterality Date  . ABDOMINAL HYSTERECTOMY    . ADENOIDECTOMY     as a child  . BACK SURGERY  2012  . BREAST LUMPECTOMY Left    Benign  . LAPAROSCOPIC VAGINAL HYSTERECTOMY WITH SALPINGECTOMY Bilateral 03/18/2017   Procedure: LAPAROSCOPIC ASSISTED VAGINAL HYSTERECTOMY WITH SALPINGECTOMY;  Surgeon: Zelphia Cairo, MD;  Location: [facility];  Service: Gynecology;  Laterality: Bilateral;  NEEDS BED  . LUMBAR LAMINECTOMY Left 01/25/2019   Procedure: LEFT LUMBAR FIVE TO SACRAL ONE, RECURRENT MICRODISCECTOMY;  Surgeon: Eldred Manges, MD;  Location: [facility];  Service: Orthopedics;  Laterality: Left;  . LUMBAR LAMINECTOMY/DECOMPRESSION MICRODISCECTOMY N/A 04/10/2019   Procedure: LEFT L5-S1 REDO MICRODISCECTOMY;  Surgeon: Kerrin Champagne, MD;  Location: [facility];  Service: Orthopedics;  Laterality: N/A;  .  SHOULDER SURGERY  2008, 2009   L shoulder, acromion shave and recurrent dislocation.  Marland Kitchen SPINE SURGERY  03/06/11   lumbar disc L5  . TUBAL LIGATION Bilateral 05/07/2014   Procedure: POST PARTUM TUBAL LIGATION;  Surgeon: Zelphia Cairo, MD;  Location: [facility];  Service: Gynecology;  Laterality: Bilateral;  . VAGINAL DELIVERY  2014, 2015  . WISDOM TOOTH EXTRACTION     Social History   Occupational History  . Not on file  Tobacco Use  . Smoking status: Former Smoker    Types: Cigarettes    Quit date: 2015    Years since quitting: 7.0  . Smokeless tobacco: Never Used  Vaping Use  . Vaping Use: Former  .  Quit date: 03/06/2020  Substance and Sexual Activity  . Alcohol use: Yes    Alcohol/week: 0.0 standard drinks    Comment: occasional  . Drug use: No  . Sexual activity: Not Currently    Birth control/protection: None    Comment: Hysterectomy

## 2020-07-23 ENCOUNTER — Encounter: Payer: Self-pay | Admitting: Orthopaedic Surgery

## 2020-08-23 ENCOUNTER — Ambulatory Visit (INDEPENDENT_AMBULATORY_CARE_PROVIDER_SITE_OTHER): Payer: PRIVATE HEALTH INSURANCE | Admitting: Orthopaedic Surgery

## 2020-08-23 ENCOUNTER — Other Ambulatory Visit: Payer: Self-pay

## 2020-08-23 ENCOUNTER — Ambulatory Visit (INDEPENDENT_AMBULATORY_CARE_PROVIDER_SITE_OTHER): Payer: PRIVATE HEALTH INSURANCE

## 2020-08-23 ENCOUNTER — Encounter: Payer: Self-pay | Admitting: Orthopaedic Surgery

## 2020-08-23 DIAGNOSIS — Z981 Arthrodesis status: Secondary | ICD-10-CM

## 2020-08-23 NOTE — Progress Notes (Signed)
Office Visit Note   Patient: Tonya [name]           Date of Birth: 1986-04-15           MRN: [id number] Visit Date: 08/23/2020              Requested by: Lianne Moris, PA-C [address] [place],  [place] [address] PCP: Lianne Moris, PA-C   Assessment & Plan: Visit Diagnoses:  1. S/P lumbar fusion     Plan: Patient can gradually resume some light work activity with weights.  Continue increasing walking elliptical and normal core strengthening exercises.  She is having surgical result no office follow-up needed.  Follow-Up Instructions: No follow-ups on file.   Orders:  Orders Placed This Encounter  Procedures  . XR Lumbar Spine 2-3 Views   No orders of the defined types were placed in this encounter.     Procedures: No procedures performed   Clinical Data: No additional findings.   Subjective: Chief Complaint  Patient presents with  . Lower Back - Pain    HPI patient returns she is walking a mile to mile and a half she is using the elliptical machine.  She states she is happy that she does not have pain and wants to get back doing some light weights to continue working out and losing weight.  Review of Systems 14 point system update unchanged from before surgery.   Objective: Vital Signs: LMP 03/02/2017 (LMP Unknown)   Physical Exam Constitutional:      Appearance: She is well-developed.  HENT:     Head: Normocephalic.     Right Ear: External ear normal.     Left Ear: External ear normal.  Eyes:     Pupils: Pupils are equal, round, and reactive to light.  Neck:     Thyroid: No thyromegaly.     Trachea: No tracheal deviation.  Cardiovascular:     Rate and Rhythm: Normal rate.  Pulmonary:     Effort: Pulmonary effort is normal.  Abdominal:     Palpations: Abdomen is soft.  Skin:    General: Skin is warm and dry.  Neurological:     Mental Status: She is alert and oriented to person, place, and time.  Psychiatric:        Behavior: Behavior normal.      Ortho Exam lumbar incisions well-healed.  No isolated motor weakness negative straight leg raising. Specialty Comments:  No specialty comments available.  Imaging: No results found.   PMFS History: Patient Active Problem List   Diagnosis Date Noted  . Lumbar stenosis 05/10/2020  . S/P lumbar fusion 05/10/2020  . Other intervertebral disc degeneration, lumbar region 03/19/2020  . Surgery follow-up 04/10/2019  . Protrusion of lumbar intervertebral disc 12/13/2018  . Menorrhagia 03/18/2017  . Status post induction of labor 05/07/2014  . SVD (spontaneous vaginal delivery) 05/07/2014  . Near syncope 01/15/2014  . Pregnancy 01/15/2014  . URI 04/01/2010  . COMMON MIGRAINE 12/17/2006  . HYPERTENSION 12/17/2006   Past Medical History:  Diagnosis Date  . Cystitis    Occas  . Dizziness   . Frequent UTI   . GERD (gastroesophageal reflux disease)   . Hand tingling    And feet  . Headache(784.0)   . Hypertension    no meds, patient reports this only while pregnant, not currently  . PONV (postoperative nausea and vomiting)   . Rapid heart rate    during pregnancy only  . Seasonal allergies  Family History  Problem Relation Age of Onset  . Diabetes Paternal Uncle   . Hypertension Paternal Grandmother   . Obesity Other   . Sleep apnea Other   . Hypertension Maternal Grandmother   . Hypertension Maternal Grandfather   . Diabetes Maternal Grandfather   . Stroke Maternal Grandfather   . Hypertension Paternal Grandfather   . Thrombophlebitis Paternal Grandfather   . Hyperlipidemia Paternal Grandfather     Past Surgical History:  Procedure Laterality Date  . ABDOMINAL HYSTERECTOMY    . ADENOIDECTOMY     as a child  . BACK SURGERY  2012  . BREAST LUMPECTOMY Left    Benign  . LAPAROSCOPIC VAGINAL HYSTERECTOMY WITH SALPINGECTOMY Bilateral 03/18/2017   Procedure: LAPAROSCOPIC ASSISTED VAGINAL HYSTERECTOMY WITH SALPINGECTOMY;  Surgeon: Zelphia Cairo, MD;  Location:  [facility];  Service: Gynecology;  Laterality: Bilateral;  NEEDS BED  . LUMBAR LAMINECTOMY Left 01/25/2019   Procedure: LEFT LUMBAR FIVE TO SACRAL ONE, RECURRENT MICRODISCECTOMY;  Surgeon: Eldred Manges, MD;  Location: [facility];  Service: Orthopedics;  Laterality: Left;  . LUMBAR LAMINECTOMY/DECOMPRESSION MICRODISCECTOMY N/A 04/10/2019   Procedure: LEFT L5-S1 REDO MICRODISCECTOMY;  Surgeon: Kerrin Champagne, MD;  Location: [facility] OR;  Service: Orthopedics;  Laterality: N/A;  . SHOULDER SURGERY  2008, 2009   L shoulder, acromion shave and recurrent dislocation.  Marland Kitchen SPINE SURGERY  03/06/11   lumbar disc L5  . TUBAL LIGATION Bilateral 05/07/2014   Procedure: POST PARTUM TUBAL LIGATION;  Surgeon: Zelphia Cairo, MD;  Location: [facility];  Service: Gynecology;  Laterality: Bilateral;  . VAGINAL DELIVERY  2014, 2015  . WISDOM TOOTH EXTRACTION     Social History   Occupational History  . Not on file  Tobacco Use  . Smoking status: Former Smoker    Types: Cigarettes    Quit date: 2015    Years since quitting: 7.2  . Smokeless tobacco: Never Used  Vaping Use  . Vaping Use: Former  . Quit date: 03/06/2020  Substance and Sexual Activity  . Alcohol use: Yes    Alcohol/week: 0.0 standard drinks    Comment: occasional  . Drug use: No  . Sexual activity: Not Currently    Birth control/protection: None    Comment: Hysterectomy

## 2020-10-06 ENCOUNTER — Ambulatory Visit (INDEPENDENT_AMBULATORY_CARE_PROVIDER_SITE_OTHER): Payer: PRIVATE HEALTH INSURANCE

## 2020-10-06 ENCOUNTER — Ambulatory Visit (HOSPITAL_COMMUNITY)
Admission: EM | Admit: 2020-10-06 | Discharge: 2020-10-06 | Disposition: A | Discharge status: Home / Self Care | Payer: PRIVATE HEALTH INSURANCE | Attending: Urgent Care | Admitting: Urgent Care

## 2020-10-06 ENCOUNTER — Encounter (HOSPITAL_COMMUNITY): Payer: Self-pay

## 2020-10-06 ENCOUNTER — Other Ambulatory Visit: Payer: Self-pay

## 2020-10-06 DIAGNOSIS — W19XXXA Unspecified fall, initial encounter: Secondary | ICD-10-CM

## 2020-10-06 DIAGNOSIS — Z23 Encounter for immunization: Secondary | ICD-10-CM

## 2020-10-06 DIAGNOSIS — M25561 Pain in right knee: Secondary | ICD-10-CM

## 2020-10-06 DIAGNOSIS — S8001XA Contusion of right knee, initial encounter: Secondary | ICD-10-CM | POA: Diagnosis not present

## 2020-10-06 DIAGNOSIS — S8991XA Unspecified injury of right lower leg, initial encounter: Secondary | ICD-10-CM

## 2020-10-06 MED ORDER — TETANUS-DIPHTH-ACELL PERTUSSIS 5-2.5-18.5 LF-MCG/0.5 IM SUSY
0.5000 mL | PREFILLED_SYRINGE | Freq: Once | INTRAMUSCULAR | Status: AC
  Administered 2020-10-06: 0.5 mL via INTRAMUSCULAR

## 2020-10-06 MED ORDER — TETANUS-DIPHTH-ACELL PERTUSSIS 5-2.5-18.5 LF-MCG/0.5 IM SUSY
PREFILLED_SYRINGE | INTRAMUSCULAR | Status: AC
  Filled 2020-10-06: qty 0.5

## 2020-10-06 MED ORDER — NAPROXEN 500 MG PO TABS: 500.0000 mg | ORAL_TABLET | Freq: Two times a day (BID) | ORAL | 0 refills | Status: DC

## 2020-10-06 NOTE — ED Provider Notes (Signed)
[facility]   MRN: [id number] DOB: [birth year]  Subjective:   BRIDGITTE [name] is a 35 y.o. female presenting for suffering a right knee injury today.  Patient states that she accidentally tripped over gravel and landed on both her knees but absorb most of the impact on her right knee.  Patient has had moderate to severe mostly constant and worsening pain.  Wanted to come to our clinic for an evaluation to make sure she does not have a fracture.  No current facility-administered medications for this encounter.  Current Outpatient Medications:  .  gabapentin (NEURONTIN) 100 MG capsule, Take 1 capsule (100 mg total) by mouth 3 (three) times daily. (Patient taking differently: Take 100 mg by mouth daily. ), Disp: 60 capsule, Rfl: 0 .  HYDROcodone-acetaminophen (NORCO/VICODIN) 5-325 MG tablet, Take 1 tablet by mouth every 6 (six) hours as needed for moderate pain., Disp: 30 tablet, Rfl: 0 .  methocarbamol (ROBAXIN) 500 MG tablet, Take 1 tablet (500 mg total) by mouth every 6 (six) hours as needed for muscle spasms., Disp: 60 tablet, Rfl: 0 .  oxyCODONE-acetaminophen (PERCOCET/ROXICET) 5-325 MG tablet, Take 1-2 tablets by mouth every 4 (four) hours as needed for severe pain., Disp: 50 tablet, Rfl: 0 .  ranitidine (ZANTAC) 75 MG tablet, Take 75 mg by mouth as needed for heartburn. Take with ibuprofen in the mornings as needed for headaches, Disp: , Rfl:    Allergies  Allergen Reactions  . Green Dyes Hives  . Ibuprofen Other (See Comments)    Esophagitis and chest pain - pt states that its ok to take with heartburn medication    Past Medical History:  Diagnosis Date  . Cystitis    Occas  . Dizziness   . Frequent UTI   . GERD (gastroesophageal reflux disease)   . Hand tingling    And feet  . Headache(784.0)   . Hypertension    no meds, patient reports this only while pregnant, not currently  . PONV (postoperative nausea and vomiting)   . Rapid heart rate    during  pregnancy only  . Seasonal allergies      Past Surgical History:  Procedure Laterality Date  . ABDOMINAL HYSTERECTOMY    . ADENOIDECTOMY     as a child  . BACK SURGERY  2012  . BREAST LUMPECTOMY Left    Benign  . LAPAROSCOPIC VAGINAL HYSTERECTOMY WITH SALPINGECTOMY Bilateral 03/18/2017   Procedure: LAPAROSCOPIC ASSISTED VAGINAL HYSTERECTOMY WITH SALPINGECTOMY;  Surgeon: Zelphia Cairo, MD;  Location: [facility] [place];  Service: Gynecology;  Laterality: Bilateral;  NEEDS BED  . LUMBAR LAMINECTOMY Left 01/25/2019   Procedure: LEFT LUMBAR FIVE TO SACRAL ONE, RECURRENT MICRODISCECTOMY;  Surgeon: Eldred Manges, MD;  Location: [facility] OR;  Service: Orthopedics;  Laterality: Left;  . LUMBAR LAMINECTOMY/DECOMPRESSION MICRODISCECTOMY N/A 04/10/2019   Procedure: LEFT L5-S1 REDO MICRODISCECTOMY;  Surgeon: Kerrin Champagne, MD;  Location: [facility] OR;  Service: Orthopedics;  Laterality: N/A;  . SHOULDER SURGERY  2008, 2009   L shoulder, acromion shave and recurrent dislocation.  Marland Kitchen SPINE SURGERY  03/06/11   lumbar disc L5  . TUBAL LIGATION Bilateral 05/07/2014   Procedure: POST PARTUM TUBAL LIGATION;  Surgeon: Zelphia Cairo, MD;  Location: [facility];  Service: Gynecology;  Laterality: Bilateral;  . VAGINAL DELIVERY  2014, 2015  . WISDOM TOOTH EXTRACTION      Family History  Problem Relation Age of Onset  . Diabetes Paternal Uncle   . Hypertension Paternal  Grandmother   . Obesity Other   . Sleep apnea Other   . Hypertension Maternal Grandmother   . Hypertension Maternal Grandfather   . Diabetes Maternal Grandfather   . Stroke Maternal Grandfather   . Hypertension Paternal Grandfather   . Thrombophlebitis Paternal Grandfather   . Hyperlipidemia Paternal Grandfather     Social History   Tobacco Use  . Smoking status: Former Smoker    Types: Cigarettes    Quit date: 2015    Years since quitting: 7.3  . Smokeless tobacco: Never Used  Vaping Use  . Vaping Use: Former  . Quit date:  03/06/2020  Substance Use Topics  . Alcohol use: Yes    Alcohol/week: 0.0 standard drinks    Comment: occasional  . Drug use: No    ROS   Objective:   Vitals: BP (!) 135/96 (BP Location: Right Arm)   Pulse 91   Temp 98.6 F (37 C) (Oral)   Resp 18   LMP 03/02/2017 (LMP Unknown)   SpO2 98%   Physical Exam Constitutional:      General: She is not in acute distress.    Appearance: Normal appearance. She is well-developed. She is obese. She is not ill-appearing, toxic-appearing or diaphoretic.  HENT:     Head: Normocephalic and atraumatic.     Nose: Nose normal.     Mouth/Throat:     Mouth: Mucous membranes are moist.     Pharynx: Oropharynx is clear.  Eyes:     General: No scleral icterus.       Right eye: No discharge.        Left eye: No discharge.     Extraocular Movements: Extraocular movements intact.     Conjunctiva/sclera: Conjunctivae normal.     Pupils: Pupils are equal, round, and reactive to light.  Cardiovascular:     Rate and Rhythm: Normal rate.  Pulmonary:     Effort: Pulmonary effort is normal.  Musculoskeletal:     Right knee: Swelling (trace), bony tenderness and crepitus present. No deformity, effusion, erythema, ecchymosis or lacerations. Decreased range of motion. Tenderness (quadriceps tendon) present over the medial joint line, lateral joint line and patellar tendon. Normal alignment and normal patellar mobility.  Skin:    General: Skin is warm and dry.  Neurological:     General: No focal deficit present.     Mental Status: She is alert and oriented to person, place, and time.     Motor: No weakness.     Coordination: Coordination abnormal (favoring right knee).     Gait: Gait normal.     Deep Tendon Reflexes: Reflexes normal.  Psychiatric:        Mood and Affect: Mood normal.        Behavior: Behavior normal.        Thought Content: Thought content normal.        Judgment: Judgment normal.     DG Knee Complete 4 Views Right  Result  Date: 10/06/2020 CLINICAL DATA:  Acute RIGHT knee pain following fall today. Initial encounter. EXAM: RIGHT KNEE - COMPLETE 4+ VIEW COMPARISON:  None. FINDINGS: No evidence of fracture, dislocation, or joint effusion. No evidence of arthropathy or other focal bone abnormality. Soft tissues are unremarkable. IMPRESSION: Negative. Electronically Signed   By: Harmon Pier M.D.   On: 10/06/2020 18:28     Assessment and Plan :   PDMP not reviewed this encounter.  1. Acute pain of right knee   2. Injury of  right knee, initial encounter   3. Contusion of right knee, initial encounter     Applied a 6" Ace wrap to the right knee.  Recommended conservative management for right knee contusion. Counseled patient on potential for adverse effects with medications prescribed/recommended today, ER and return-to-clinic precautions discussed, patient verbalized understanding.    Wallis Bamberg, [place] 10/09/20 [phone]

## 2020-10-06 NOTE — ED Triage Notes (Signed)
Pt presents with right knee pain from a fall injury at a baseball game earlier today.

## 2020-11-07 ENCOUNTER — Telehealth: Payer: Self-pay | Admitting: Orthopaedic Surgery

## 2020-11-07 NOTE — Telephone Encounter (Signed)
Received vm from [place] w/ [employer]. Checking on request for records. IC,lmvm [phone] advised we have not received a request from their firm. Left both our fax numbers for the request to be sent.

## 2021-05-07 DIAGNOSIS — R519 Headache, unspecified: Secondary | ICD-10-CM | POA: Insufficient documentation

## 2021-06-12 ENCOUNTER — Inpatient Hospital Stay (HOSPITAL_COMMUNITY)
Admission: EM | Admit: 2021-06-12 | Discharge: 2021-06-14 | DRG: 065 | Disposition: A | Discharge status: Home / Self Care | Payer: PRIVATE HEALTH INSURANCE | Attending: Family Medicine | Admitting: Family Medicine

## 2021-06-12 ENCOUNTER — Encounter (HOSPITAL_COMMUNITY): Payer: Self-pay | Admitting: Emergency Medicine

## 2021-06-12 ENCOUNTER — Emergency Department (HOSPITAL_COMMUNITY): Payer: PRIVATE HEALTH INSURANCE

## 2021-06-12 ENCOUNTER — Other Ambulatory Visit: Payer: Self-pay

## 2021-06-12 DIAGNOSIS — F17290 Nicotine dependence, other tobacco product, uncomplicated: Secondary | ICD-10-CM | POA: Diagnosis present

## 2021-06-12 DIAGNOSIS — Z9102 Food additives allergy status: Secondary | ICD-10-CM

## 2021-06-12 DIAGNOSIS — Z83438 Family history of other disorder of lipoprotein metabolism and other lipidemia: Secondary | ICD-10-CM

## 2021-06-12 DIAGNOSIS — Z8249 Family history of ischemic heart disease and other diseases of the circulatory system: Secondary | ICD-10-CM

## 2021-06-12 DIAGNOSIS — I10 Essential (primary) hypertension: Secondary | ICD-10-CM | POA: Diagnosis present

## 2021-06-12 DIAGNOSIS — I639 Cerebral infarction, unspecified: Secondary | ICD-10-CM | POA: Diagnosis present

## 2021-06-12 DIAGNOSIS — R5383 Other fatigue: Secondary | ICD-10-CM | POA: Diagnosis present

## 2021-06-12 DIAGNOSIS — R519 Headache, unspecified: Secondary | ICD-10-CM | POA: Diagnosis not present

## 2021-06-12 DIAGNOSIS — I63541 Cerebral infarction due to unspecified occlusion or stenosis of right cerebellar artery: Principal | ICD-10-CM | POA: Diagnosis present

## 2021-06-12 DIAGNOSIS — Z823 Family history of stroke: Secondary | ICD-10-CM

## 2021-06-12 DIAGNOSIS — D72829 Elevated white blood cell count, unspecified: Secondary | ICD-10-CM | POA: Diagnosis present

## 2021-06-12 DIAGNOSIS — Z981 Arthrodesis status: Secondary | ICD-10-CM

## 2021-06-12 DIAGNOSIS — G43909 Migraine, unspecified, not intractable, without status migrainosus: Secondary | ICD-10-CM | POA: Diagnosis present

## 2021-06-12 DIAGNOSIS — E871 Hypo-osmolality and hyponatremia: Secondary | ICD-10-CM | POA: Diagnosis present

## 2021-06-12 DIAGNOSIS — E785 Hyperlipidemia, unspecified: Secondary | ICD-10-CM | POA: Diagnosis present

## 2021-06-12 DIAGNOSIS — Z6836 Body mass index (BMI) 36.0-36.9, adult: Secondary | ICD-10-CM

## 2021-06-12 DIAGNOSIS — E876 Hypokalemia: Secondary | ICD-10-CM | POA: Diagnosis present

## 2021-06-12 DIAGNOSIS — K5900 Constipation, unspecified: Secondary | ICD-10-CM | POA: Diagnosis present

## 2021-06-12 DIAGNOSIS — Z79899 Other long term (current) drug therapy: Secondary | ICD-10-CM

## 2021-06-12 DIAGNOSIS — R29701 NIHSS score 1: Secondary | ICD-10-CM | POA: Diagnosis present

## 2021-06-12 DIAGNOSIS — Z20822 Contact with and (suspected) exposure to covid-19: Secondary | ICD-10-CM | POA: Diagnosis present

## 2021-06-12 DIAGNOSIS — Z886 Allergy status to analgesic agent status: Secondary | ICD-10-CM

## 2021-06-12 DIAGNOSIS — D751 Secondary polycythemia: Secondary | ICD-10-CM | POA: Diagnosis present

## 2021-06-12 DIAGNOSIS — Z833 Family history of diabetes mellitus: Secondary | ICD-10-CM

## 2021-06-12 DIAGNOSIS — K219 Gastro-esophageal reflux disease without esophagitis: Secondary | ICD-10-CM | POA: Diagnosis present

## 2021-06-12 DIAGNOSIS — Z8632 Personal history of gestational diabetes: Secondary | ICD-10-CM

## 2021-06-12 DIAGNOSIS — E669 Obesity, unspecified: Secondary | ICD-10-CM | POA: Diagnosis present

## 2021-06-12 LAB — CBC WITH DIFFERENTIAL/PLATELET
Abs Immature Granulocytes: 0.06 10*3/uL (ref 0.00–0.07)
Basophils Absolute: 0.1 10*3/uL (ref 0.0–0.1)
Basophils Relative: 1 %
Eosinophils Absolute: 0.2 10*3/uL (ref 0.0–0.5)
Eosinophils Relative: 1 %
HCT: 47.1 % — ABNORMAL HIGH (ref 36.0–46.0)
Hemoglobin: 15.6 g/dL — ABNORMAL HIGH (ref 12.0–15.0)
Immature Granulocytes: 0 %
Lymphocytes Relative: 24 %
Lymphs Abs: 4.2 10*3/uL — ABNORMAL HIGH (ref 0.7–4.0)
MCH: 29.7 pg (ref 26.0–34.0)
MCHC: 33.1 g/dL (ref 30.0–36.0)
MCV: 89.7 fL (ref 80.0–100.0)
Monocytes Absolute: 1 10*3/uL (ref 0.1–1.0)
Monocytes Relative: 6 %
Neutro Abs: 12.2 10*3/uL — ABNORMAL HIGH (ref 1.7–7.7)
Neutrophils Relative %: 68 %
Platelets: 413 10*3/uL — ABNORMAL HIGH (ref 150–400)
RBC: 5.25 MIL/uL — ABNORMAL HIGH (ref 3.87–5.11)
RDW: 12 % (ref 11.5–15.5)
WBC: 17.8 10*3/uL — ABNORMAL HIGH (ref 4.0–10.5)
nRBC: 0 % (ref 0.0–0.2)

## 2021-06-12 LAB — URINALYSIS, ROUTINE W REFLEX MICROSCOPIC
Bacteria, UA: NONE SEEN
Bilirubin Urine: NEGATIVE
Glucose, UA: NEGATIVE mg/dL
Ketones, ur: NEGATIVE mg/dL
Leukocytes,Ua: NEGATIVE
Nitrite: NEGATIVE
Protein, ur: NEGATIVE mg/dL
Specific Gravity, Urine: 1.006 (ref 1.005–1.030)
pH: 6 (ref 5.0–8.0)

## 2021-06-12 LAB — BASIC METABOLIC PANEL
Anion gap: 11 (ref 5–15)
BUN: 12 mg/dL (ref 6–20)
CO2: 25 mmol/L (ref 22–32)
Calcium: 9.3 mg/dL (ref 8.9–10.3)
Chloride: 97 mmol/L — ABNORMAL LOW (ref 98–111)
Creatinine, Ser: 0.73 mg/dL (ref 0.44–1.00)
GFR, Estimated: >60 mL/min (ref >60)
Glucose, Bld: 100 mg/dL — ABNORMAL HIGH (ref 70–99)
Potassium: 4.6 mmol/L (ref 3.5–5.1)
Sodium: 133 mmol/L — ABNORMAL LOW (ref 135–145)

## 2021-06-12 LAB — TROPONIN I (HIGH SENSITIVITY)
Troponin I (High Sensitivity): 7 ng/L (ref <18)
Troponin I (High Sensitivity): 5 ng/L (ref <18)

## 2021-06-12 NOTE — ED Provider Triage Note (Signed)
Emergency Medicine Provider Triage Evaluation Note  Tonya [name] , a 36 y.o. female  was evaluated in triage.  Pt complains of weakness.  Patient states that on Friday morning she woke up with severe vertigo and floaters in her eyes.  She states that she then had a sudden, sharp, parietal headache.  She has a history of migraine but states that this is different pattern of headache for her.  She states that later in the day she had an episode of chest pain.  She states that she called her primary care physician and ended up being put on amlodipine over the weekend.  She states that symptoms have been persistent since Friday with ongoing floaters and vertigo.  She also states that she feels generally fatigued like she is "run a marathon."  Review of Systems  Positive: See above Negative:   Physical Exam  BP (!) 176/112 (BP Location: Right Arm)    Pulse 98    Temp 99.4 F (37.4 C) (Oral)    Resp 16    LMP 03/02/2017 (LMP Unknown)    SpO2 99%  Gen:   Awake, no distress   Resp:  Normal effort  MSK:   Moves extremities without difficulty  Other:  S1/S2 without murmur.  Lungs clear to auscultation.  No focal neurological deficits.  She does have horizontal nystagmus.  Medical Decision Making  Medically screening exam initiated at 7:08 PM.  Appropriate orders placed.  Larey Brick was informed that the remainder of the evaluation will be completed by another provider, this initial triage assessment does not replace that evaluation, and the importance of remaining in the ED until their evaluation is complete.     Cristopher Peru, PA-C 06/12/21 [phone]

## 2021-06-12 NOTE — ED Triage Notes (Signed)
Pt reports hypertension, dizziness, and weakness that started Tuesday. Pt states on Tuesday she had a sharp stabbing headache followed by dizziness and "floaters in my left eye."

## 2021-06-13 ENCOUNTER — Encounter (HOSPITAL_COMMUNITY): Payer: Self-pay

## 2021-06-13 ENCOUNTER — Emergency Department (HOSPITAL_COMMUNITY): Payer: PRIVATE HEALTH INSURANCE

## 2021-06-13 ENCOUNTER — Observation Stay (HOSPITAL_COMMUNITY): Payer: PRIVATE HEALTH INSURANCE

## 2021-06-13 DIAGNOSIS — I6389 Other cerebral infarction: Secondary | ICD-10-CM

## 2021-06-13 DIAGNOSIS — I10 Essential (primary) hypertension: Secondary | ICD-10-CM | POA: Diagnosis not present

## 2021-06-13 DIAGNOSIS — I639 Cerebral infarction, unspecified: Secondary | ICD-10-CM | POA: Diagnosis present

## 2021-06-13 DIAGNOSIS — D72829 Elevated white blood cell count, unspecified: Secondary | ICD-10-CM | POA: Diagnosis present

## 2021-06-13 DIAGNOSIS — E871 Hypo-osmolality and hyponatremia: Secondary | ICD-10-CM | POA: Diagnosis present

## 2021-06-13 DIAGNOSIS — R5383 Other fatigue: Secondary | ICD-10-CM | POA: Diagnosis present

## 2021-06-13 LAB — CBC
HCT: 47.4 % — ABNORMAL HIGH (ref 36.0–46.0)
Hemoglobin: 15.4 g/dL — ABNORMAL HIGH (ref 12.0–15.0)
MCH: 29.4 pg (ref 26.0–34.0)
MCHC: 32.5 g/dL (ref 30.0–36.0)
MCV: 90.6 fL (ref 80.0–100.0)
Platelets: 295 10*3/uL (ref 150–400)
RBC: 5.23 MIL/uL — ABNORMAL HIGH (ref 3.87–5.11)
RDW: 12 % (ref 11.5–15.5)
WBC: 13.5 10*3/uL — ABNORMAL HIGH (ref 4.0–10.5)
nRBC: 0 % (ref 0.0–0.2)

## 2021-06-13 LAB — BASIC METABOLIC PANEL
Anion gap: 13 (ref 5–15)
BUN: 9 mg/dL (ref 6–20)
CO2: 18 mmol/L — ABNORMAL LOW (ref 22–32)
Calcium: 8.3 mg/dL — ABNORMAL LOW (ref 8.9–10.3)
Chloride: 101 mmol/L (ref 98–111)
Creatinine, Ser: 0.53 mg/dL (ref 0.44–1.00)
GFR, Estimated: >60 mL/min (ref >60)
Glucose, Bld: 91 mg/dL (ref 70–99)
Potassium: 3.1 mmol/L — ABNORMAL LOW (ref 3.5–5.1)
Sodium: 132 mmol/L — ABNORMAL LOW (ref 135–145)

## 2021-06-13 LAB — LIPID PANEL
Cholesterol: 251 mg/dL — ABNORMAL HIGH (ref 0–200)
HDL: 41 mg/dL (ref >40)
LDL Cholesterol: 181 mg/dL — ABNORMAL HIGH (ref 0–99)
Total CHOL/HDL Ratio: 6.1 RATIO
Triglycerides: 146 mg/dL (ref <150)
VLDL: 29 mg/dL (ref 0–40)

## 2021-06-13 LAB — RAPID URINE DRUG SCREEN, HOSP PERFORMED
Amphetamines: NOT DETECTED
Barbiturates: NOT DETECTED
Benzodiazepines: NOT DETECTED
Cocaine: NOT DETECTED
Opiates: NOT DETECTED
Tetrahydrocannabinol: NOT DETECTED

## 2021-06-13 LAB — HIV ANTIBODY (ROUTINE TESTING W REFLEX): HIV Screen 4th Generation wRfx: NONREACTIVE

## 2021-06-13 LAB — RESP PANEL BY RT-PCR (FLU A&B, COVID) ARPGX2
Influenza A by PCR: NEGATIVE
Influenza B by PCR: NEGATIVE
SARS Coronavirus 2 by RT PCR: NEGATIVE

## 2021-06-13 LAB — ECHOCARDIOGRAM COMPLETE BUBBLE STUDY
Area-P 1/2: 3.99 cm2
S' Lateral: 2.5 cm

## 2021-06-13 LAB — VITAMIN B12: Vitamin B-12: 272 pg/mL (ref 180–914)

## 2021-06-13 LAB — TSH: TSH: 1.918 u[IU]/mL (ref 0.350–4.500)

## 2021-06-13 MED ORDER — ACETAMINOPHEN 650 MG RE SUPP: 650.0000 mg | RECTAL | Status: DC | PRN

## 2021-06-13 MED ORDER — ATORVASTATIN CALCIUM 80 MG PO TABS
80.0000 mg | ORAL_TABLET | Freq: Every day | ORAL | Status: DC
  Administered 2021-06-13 – 2021-06-14 (×2): 80 mg via ORAL
  Filled 2021-06-13: qty 2
  Filled 2021-06-13: qty 1

## 2021-06-13 MED ORDER — LACTATED RINGERS IV BOLUS: 500.0000 mL | Freq: Once | INTRAVENOUS | Status: DC

## 2021-06-13 MED ORDER — ENOXAPARIN SODIUM 40 MG/0.4ML IJ SOSY
40.0000 mg | PREFILLED_SYRINGE | INTRAMUSCULAR | Status: DC
  Administered 2021-06-13 – 2021-06-14 (×2): 40 mg via SUBCUTANEOUS
  Filled 2021-06-13 (×2): qty 0.4

## 2021-06-13 MED ORDER — STROKE: EARLY STAGES OF RECOVERY BOOK: Freq: Once | Status: DC

## 2021-06-13 MED ORDER — ONDANSETRON HCL 4 MG/2ML IJ SOLN
4.0000 mg | Freq: Four times a day (QID) | INTRAMUSCULAR | Status: DC | PRN
  Administered 2021-06-13: 4 mg via INTRAVENOUS
  Filled 2021-06-13: qty 2

## 2021-06-13 MED ORDER — LABETALOL HCL 5 MG/ML IV SOLN: 5.0000 mg | Freq: Three times a day (TID) | INTRAVENOUS | Status: DC | PRN

## 2021-06-13 MED ORDER — GADOBUTROL 1 MMOL/ML IV SOLN
9.7000 mL | Freq: Once | INTRAVENOUS | Status: AC | PRN
  Administered 2021-06-13: 9.7 mL via INTRAVENOUS

## 2021-06-13 MED ORDER — CLOPIDOGREL BISULFATE 75 MG PO TABS
75.0000 mg | ORAL_TABLET | Freq: Every day | ORAL | Status: DC
Start: 2021-06-13 — End: 2021-06-14
  Administered 2021-06-13 – 2021-06-14 (×2): 75 mg via ORAL
  Filled 2021-06-13 (×2): qty 1

## 2021-06-13 MED ORDER — LOSARTAN POTASSIUM 50 MG PO TABS: 100.0000 mg | ORAL_TABLET | Freq: Every day | ORAL | Status: DC

## 2021-06-13 MED ORDER — SODIUM CHLORIDE 0.9 % IV BOLUS
500.0000 mL | Freq: Once | INTRAVENOUS | Status: AC
  Administered 2021-06-13: 500 mL via INTRAVENOUS

## 2021-06-13 MED ORDER — ACETAMINOPHEN 325 MG PO TABS
650.0000 mg | ORAL_TABLET | ORAL | Status: DC | PRN
  Administered 2021-06-14: 650 mg via ORAL
  Filled 2021-06-13: qty 2

## 2021-06-13 MED ORDER — IOHEXOL 350 MG/ML SOLN
50.0000 mL | Freq: Once | INTRAVENOUS | Status: AC | PRN
  Administered 2021-06-13: 50 mL via INTRAVENOUS

## 2021-06-13 MED ORDER — ASPIRIN EC 81 MG PO TBEC
81.0000 mg | DELAYED_RELEASE_TABLET | Freq: Every day | ORAL | Status: DC
  Administered 2021-06-14: 81 mg via ORAL
  Filled 2021-06-13: qty 1

## 2021-06-13 MED ORDER — SENNOSIDES-DOCUSATE SODIUM 8.6-50 MG PO TABS
1.0000 | ORAL_TABLET | Freq: Every evening | ORAL | Status: DC | PRN
  Administered 2021-06-14: 1 via ORAL
  Filled 2021-06-13: qty 1

## 2021-06-13 MED ORDER — ASPIRIN 325 MG PO TABS
325.0000 mg | ORAL_TABLET | Freq: Once | ORAL | Status: AC
  Administered 2021-06-13: 325 mg via ORAL
  Filled 2021-06-13: qty 1

## 2021-06-13 MED ORDER — ACETAMINOPHEN 160 MG/5ML PO SOLN: 650.0000 mg | ORAL | Status: DC | PRN

## 2021-06-13 NOTE — ED Notes (Signed)
Pt coming from MRI 

## 2021-06-13 NOTE — ED Notes (Signed)
[facility] called again and asked to place purple man for pt report

## 2021-06-13 NOTE — ED Notes (Signed)
Patient transported to MRi 

## 2021-06-13 NOTE — Assessment & Plan Note (Addendum)
Out of 24-48 hour window for permissive HTN  -bp have been decent, will hold medication at this time with PRN IV parameters, may need lower dose of losartan.  Was tried on norvasc for 3-4 days with no change in blood pressure so this was changed to losartan and hctz.  Hold hctz at this time and would not do 50mg , little benefit of higher doses with higher risks of SE.  -consider renal if blood pressure not responding as we would expect w

## 2021-06-13 NOTE — Assessment & Plan Note (Addendum)
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 until bedside swallow screen-passed, diet ordered  -PT/ OT consult

## 2021-06-13 NOTE — Assessment & Plan Note (Signed)
Mild. Small bolus, hold hctz Trend

## 2021-06-13 NOTE — ED Notes (Signed)
Patient called x3 for vitals with no response 

## 2021-06-13 NOTE — ED Notes (Signed)
Pt states that coming from the MRI she feels dizzy or off. Pt was able to ambulate in room and put on gown without assistance.

## 2021-06-13 NOTE — Assessment & Plan Note (Signed)
Could be secondary to CVA vs. Uncontrolled HTN -check TSH, B12 and ANA for other underlying causes

## 2021-06-13 NOTE — H&P (Addendum)
History and Physical    Tonya BrickHeather H Bryk ZOX:[id number] DOB: [birth year] DOA: 06/12/2021  PCP: Romeo AppleBen Man, PA at [facility]  Consultants:  none  Patient coming from:  Home - lives with husband and 2 kids  Chief Complaint: weakness, headache, vision changes and dizziness.   HPI: Tonya [name] is a 36 y.o. female with medical history significant of HTN that was recently diagnosed this past week, lumbar stenosis s/p fusion in 05/2020 who presented to ED with fatigue, heaviness in limbs, headache and intermittent vision changes and dizziness.  She states last Friday AM she woke up and was extremely dizzy and off balance. Blood pressure at that time was 156/100 and then 178/117. She laid down and when she got up again she was swaying to her right. When she went back to bed she got a sharp pain in her right parietal lobe area and took ibuprofen. It felt like an ice pick. The ibuprofen helped and headache resolved. When she woke up again she had numerous floaters in her vision. These have been intermittent over the past week. This morning she also had some blurry vision in her right eye that has resolved. She states over this past week she feels like floats when she walks and has to stop and catch her breath because she is so exhausted. She denies any loss of sensation, facial drooping or slurred speech. She denies drooping eyes or weakness after repetitive movements.   -she was started on losartan 100mg  and hctz 50mg  last week by pcp.   Risk factors for stroke: history of vaping, stopped a few days ago. No hx of diabetes or high cholesterol. Remote hx of HTN in her 1520s, lost weight and had no issue until last week. No hx of strokes in her family. She is on no OCP/hormones. No hx of recent vaccines. No hx of SLE/coag issues in family.   She denies any fever/chills, chest pain/palpitations, shortness of breath/cough, stomach pain, +nausea, no vomiting/diarrhea, no dysuria, no leg swelling, no rashes.     ED Course: vitals: temp: 99.4, bp: 176/112, HR: 98, RR: 16, oxygen 99% on room air.  Pertinent labs: wbc: 17.8, platelets: 413,  CTH: wedge shaped hypodensity in the inferior aspect of the cerebellum on right.  MRI: acute infarcts in the inferior right cerebellum, PICA territory. Subacute infarct in superior posterior right cerebellum, SCA territory.  CTA head/neck: no significant findings.  In ED: neurology consulted and [facility] was asked to admit for acute CVA.   Review of Systems: As per HPI; otherwise review of systems reviewed and negative.   Ambulatory Status:  Ambulates without assistance   Past Medical History:  Diagnosis Date   Cystitis    Occas   Dizziness    Frequent UTI    GERD (gastroesophageal reflux disease)    Hand tingling    And feet   Headache(784.0)    Hypertension    no meds, patient reports this only while pregnant, not currently   PONV (postoperative nausea and vomiting)    Rapid heart rate    during pregnancy only   Seasonal allergies     Past Surgical History:  Procedure Laterality Date   ABDOMINAL HYSTERECTOMY     ADENOIDECTOMY     as a child   BACK SURGERY  2012   BREAST LUMPECTOMY Left    Benign   LAPAROSCOPIC VAGINAL HYSTERECTOMY WITH SALPINGECTOMY Bilateral 03/18/2017   Procedure: LAPAROSCOPIC ASSISTED VAGINAL HYSTERECTOMY WITH SALPINGECTOMY;  Surgeon: Zelphia CairoAdkins, Gretchen, MD;  Location: [facility];  Service: Gynecology;  Laterality: Bilateral;  NEEDS BED   LUMBAR LAMINECTOMY Left 01/25/2019   Procedure: LEFT LUMBAR FIVE TO SACRAL ONE, RECURRENT MICRODISCECTOMY;  Surgeon: Eldred Manges, MD;  Location: [facility] OR;  Service: Orthopedics;  Laterality: Left;   LUMBAR LAMINECTOMY/DECOMPRESSION MICRODISCECTOMY N/A 04/10/2019   Procedure: LEFT L5-S1 REDO MICRODISCECTOMY;  Surgeon: Kerrin Champagne, MD;  Location: [facility] OR;  Service: Orthopedics;  Laterality: N/A;   SHOULDER SURGERY  2008, 2009   L shoulder, acromion shave and recurrent  dislocation.   SPINE SURGERY  03/06/11   lumbar disc L5   TUBAL LIGATION Bilateral 05/07/2014   Procedure: POST PARTUM TUBAL LIGATION;  Surgeon: Zelphia Cairo, MD;  Location: [facility];  Service: Gynecology;  Laterality: Bilateral;   VAGINAL DELIVERY  2014, 2015   WISDOM TOOTH EXTRACTION      Social History   Socioeconomic History   Marital status: Married    Spouse name: Not on file   Number of children: Not on file   Years of education: Not on file   Highest education level: Not on file  Occupational History   Not on file  Tobacco Use   Smoking status: Former    Types: Cigarettes    Quit date: 2015    Years since quitting: 8.0   Smokeless tobacco: Never  Vaping Use   Vaping Use: Former   Quit date: 03/06/2020  Substance and Sexual Activity   Alcohol use: Yes    Alcohol/week: 0.0 standard drinks    Comment: occasional   Drug use: No   Sexual activity: Not Currently    Birth control/protection: None    Comment: Hysterectomy  Other Topics Concern   Not on file  Social History Narrative   Not on file   Social Determinants of Health   Financial Resource Strain: Not on file  Food Insecurity: Not on file  Transportation Needs: Not on file  Physical Activity: Not on file  Stress: Not on file  Social Connections: Not on file  Intimate Partner Violence: Not on file    Allergies  Allergen Reactions   Green Dyes Hives   Ibuprofen Other (See Comments)    Esophagitis and chest pain - pt states that its ok to take with heartburn medication    Family History  Problem Relation Age of Onset   Diabetes Paternal Uncle    Hypertension Paternal Grandmother    Obesity Other    Sleep apnea Other    Hypertension Maternal Grandmother    Hypertension Maternal Grandfather    Diabetes Maternal Grandfather    Stroke Maternal Grandfather    Hypertension Paternal Grandfather    Thrombophlebitis Paternal Grandfather    Hyperlipidemia Paternal Grandfather     Prior to Admission  medications   Medication Sig Start Date End Date Taking? Authorizing Provider  gabapentin (NEURONTIN) 100 MG capsule Take 1 capsule (100 mg total) by mouth 3 (three) times daily. Patient taking differently: Take 100 mg by mouth daily.  01/23/20   Eldred Manges, MD  HYDROcodone-acetaminophen (NORCO/VICODIN) 5-325 MG tablet Take 1 tablet by mouth every 6 (six) hours as needed for moderate pain. 05/21/20   Eldred Manges, MD  methocarbamol (ROBAXIN) 500 MG tablet Take 1 tablet (500 mg total) by mouth every 6 (six) hours as needed for muscle spasms. 05/10/20   Naida Sleight, PA-C  naproxen (NAPROSYN) 500 MG tablet Take 1 tablet (500 mg total) by mouth 2 (two) times daily with a  meal. 10/06/20   Wallis Bamberg, PA-C  oxyCODONE-acetaminophen (PERCOCET/ROXICET) 5-325 MG tablet Take 1-2 tablets by mouth every 4 (four) hours as needed for severe pain. 05/10/20   Naida Sleight, PA-C  ranitidine (ZANTAC) 75 MG tablet Take 75 mg by mouth as needed for heartburn. Take with ibuprofen in the mornings as needed for headaches    [provider]    Physical Exam: Vitals:   06/13/21 1302 06/13/21 1340 06/13/21 1723 06/13/21 1730  BP: (!) 142/111  138/78 (!) 130/98  Pulse: (!) 104  95 97  Resp: 16  20 14   Temp: 98.3 F (36.8 C)     TempSrc: Oral     SpO2: 100%  97% 96%  Weight:  102.1 kg    Height:  5\' 6"  (1.676 m)       General:  Appears calm and comfortable and is in NAD Eyes:  PERRL, EOMI, normal lids, iris ENT:  grossly normal hearing, lips & tongue, mmm; appropriate dentition Neck:  no LAD, masses or thyromegaly; no carotid bruits Cardiovascular:  RRR, no m/r/g. No LE edema.  Respiratory:   CTA bilaterally with no wheezes/rales/rhonchi.  Normal respiratory effort. Abdomen:  soft, NT, ND, NABS Back:   normal alignment, no CVAT Skin:  no rash or induration seen on limited exam Musculoskeletal:  grossly normal tone BUE/BLE-5/5 strength globally, good ROM, no bony abnormality Lower extremity:  No  LE edema.  Limited foot exam with no ulcerations.  2+ distal pulses. Psychiatric:  grossly normal mood and affect, speech fluent and appropriate, AOx3 Neurologic:  CN 2-12 grossly intact, very mild nystagmus, moves all extremities in coordinated fashion, sensation intact. Diadochokinesis intact, FTN intact bilaterally, HTK test intact bilaterally. Gait deferred. DTR 2+. Negative pronator drift.     Radiological Exams on Admission: Independently reviewed - see discussion in A/P where applicable  CT ANGIO HEAD NECK W WO CM  Result Date: 06/13/2021 CLINICAL DATA:  Stroke follow-up EXAM: CT ANGIOGRAPHY HEAD AND NECK TECHNIQUE: Multidetector CT imaging of the head and neck was performed using the standard protocol during bolus administration of intravenous contrast. Multiplanar CT image reconstructions and MIPs were obtained to evaluate the vascular anatomy. Carotid stenosis measurements (when applicable) are obtained utilizing NASCET criteria, using the distal internal carotid diameter as the denominator. CONTRAST:  50mL OMNIPAQUE IOHEXOL 350 MG/ML SOLN COMPARISON:  CT head 06/12/2021, MRI head 06/13/2021 FINDINGS: CT HEAD FINDINGS Brain: Redemonstrated areas of hypodensity in the right cerebellum, which correlate with acute and subacute infarcts on same-day MRI. No new area of hypodensity. No acute hemorrhage, mass, mass effect, or midline shift. No significant mass effect on the fourth ventricle. No hydrocephalus or extra-axial collection. Vascular: No hyperdense vessel. Skull: Normal. Negative for fracture or focal lesion. Sinuses: Mucous retention cysts in the right maxillary sinus. Mild mucosal thickening in the ethmoid air cells. Orbits: Negative. CTA NECK FINDINGS Aortic arch: Standard branching. Imaged portion shows no evidence of aneurysm or dissection. No significant stenosis of the major arch vessel origins. Right carotid system: No evidence of dissection, stenosis (50% or greater) or occlusion.  Left carotid system: No evidence of dissection, stenosis (50% or greater) or occlusion. Vertebral arteries: Diminutive right vertebral artery, from its origin to the vertebrobasilar junction. Normal right vertebral artery. No evidence of dissection, stenosis, or occlusion. Skeleton: No acute osseous abnormality. Other neck: Negative. Upper chest: Negative. Review of the MIP images confirms the above findings CTA HEAD FINDINGS Anterior circulation: Both internal carotid arteries are patent to the  termini, without stenosis or other abnormality. A1 segments patent. Normal anterior communicating artery. Anterior cerebral arteries are patent to their distal aspects. No M1 stenosis or occlusion. Normal MCA bifurcations. Distal MCA branches perfused and symmetric. Posterior circulation: Vertebral arteries patent to the vertebrobasilar junction without stenosis. Posterior inferior cerebral arteries patent bilaterally. Basilar patent to its distal aspect. Superior cerebellar arteries patent bilaterally. Bilateral P1 segments originate from the basilar artery. PCAs perfused to their distal aspects without stenosis. The posterior communicating arteries are not visualized. Venous sinuses: As permitted by contrast timing, patent. Anatomic variants: None significant Review of the MIP images confirms the above findings IMPRESSION: 1. No evidence of dissection. 2.  No intracranial large vessel occlusion or significant stenosis. 3.  No hemodynamically significant stenosis in the neck. 4. Redemonstrated hypodensity in the right cerebellar hemisphere, consistent with the acute and subacute infarcts seen on the same-day MRI. Electronically Signed   By: Wiliam Ke M.D.   On: 06/13/2021 13:38   DG Chest 2 View  Result Date: 06/12/2021 CLINICAL DATA:  Chest pain, hypertension. EXAM: CHEST - 2 VIEW COMPARISON:  03/31/2007. FINDINGS: The heart size and mediastinal contours are within normal limits. Both lungs are clear. No acute  osseous abnormality. IMPRESSION: No active cardiopulmonary disease. Electronically Signed   By: Thornell Sartorius M.D.   On: 06/12/2021 20:53   CT Head Wo Contrast  Result Date: 06/12/2021 CLINICAL DATA:  Headache, hypertension, dizziness, and weakness. EXAM: CT HEAD WITHOUT CONTRAST TECHNIQUE: Contiguous axial images were obtained from the base of the skull through the vertex without intravenous contrast. COMPARISON:  None. FINDINGS: Brain: No acute intracranial hemorrhage, midline shift or mass effect. No extra-axial fluid collection. Gray-white matter differentiation is within normal limits and there is no hydrocephalus. There is a wedge-shaped hypodensity in the inferior aspect of the cerebellum on the right. Vascular: No hyperdense vessel or unexpected calcification. Skull: Normal. Negative for fracture or focal lesion. Sinuses/Orbits: Mild mucosal thickening is present in the right maxillary sinus, left sphenoid sinus, and bilateral ethmoid air cells. The orbits are within normal limits. Other: None. IMPRESSION: Wedge-shaped hypodensity in the inferior aspect of the cerebellum on the right, possible infarct of indeterminate age versus other etiology. MRI with contrast is recommended for further evaluation. Electronically Signed   By: Thornell Sartorius M.D.   On: 06/12/2021 20:52   MR Brain W and Wo Contrast  Result Date: 06/13/2021 CLINICAL DATA:  Dizziness, headache, weakness EXAM: MRI HEAD WITHOUT AND WITH CONTRAST TECHNIQUE: Multiplanar, multiecho pulse sequences of the brain and surrounding structures were obtained without and with intravenous contrast. CONTRAST:  9.33mL GADAVIST GADOBUTROL 1 MMOL/ML IV SOLN COMPARISON:  06/12/2021 CT head.  No prior MRI. FINDINGS: Brain: Several foci of restricted diffusion in the inferior right cerebellum, the largest of which measures up to 1.6 cm (series 4, image 12). These are associated with T2 hyperintense signal and contrast enhancement (series 11, images 6-10). An  additional area of enhancement in the superior posterior right cerebellum (series 11, image 14), is not associated with abnormal signal on diffusion-weighted imaging, which has likely normalized. No other abnormal enhancement. No acute hemorrhage, mass, mass effect, or midline shift. No hydrocephalus or extra-axial collection. No hemosiderin deposition to suggest remote hemorrhage. Vascular: Normal flow voids. Skull and upper cervical spine: Normal marrow signal. Sinuses/Orbits: Mucous retention cysts in the right maxillary sinus. Mild mucosal thickening in the anterior ethmoid air cells. Otherwise negative. Other: The mastoids are well aerated. IMPRESSION: 1. Acute infarcts in the inferior  right cerebellum, in the PICA territory, which demonstrate mildly increased signal on diffusion-weighted imaging, contrast enhancement, and T2 hyperintensity. 2. Subacute infarct in the superior posterior right cerebellum, in the SCA territory, with normalization on diffusion-weighted imaging but contrast enhancement and T2 hyperintense signal. Given multiple posterior vascular territories and different ages of infarcts, consider a vertebral artery etiology; a CTA of the head and neck is recommended. These results were called by telephone at the time of interpretation on 06/13/2021 at 12:33 pm to provider DR. HORTON, who verbally acknowledged these results. Electronically Signed   By: Wiliam Ke M.D.   On: 06/13/2021 12:34   ECHOCARDIOGRAM COMPLETE BUBBLE STUDY  Result Date: 06/13/2021    ECHOCARDIOGRAM REPORT   Patient Name:   NEFERTITI MOHAMAD Date of Exam: 06/13/2021 Medical Rec #:  [id number]       Height:       66.0 in Accession #:    [id number]      Weight:       225.0 lb Date of Birth:  [birth year]       BSA:          2.102 m Patient Age:    35 years        BP:           142/111 mmHg Patient Gender: F               HR:           93 bpm. Exam Location:  Inpatient Procedure: 2D Echo and Saline Contrast Bubble Study  Indications:    stroke  History:        Patient has no prior history of Echocardiogram examinations.                 Risk Factors:Hypertension.  Sonographer:    Delcie Roch RDCS Referring Phys: [id number] Desia Saban IMPRESSIONS  1. Left ventricular ejection fraction, by estimation, is 60 to 65%. The left ventricle has normal function. The left ventricle has no regional wall motion abnormalities. Left ventricular diastolic parameters were normal.  2. Area of lateral apical RV hypokinesis . Right ventricular systolic function is mildly reduced. The right ventricular size is mildly enlarged.  3. The mitral valve is normal in structure. No evidence of mitral valve regurgitation. No evidence of mitral stenosis.  4. The aortic valve is tricuspid. Aortic valve regurgitation is not visualized. No aortic stenosis is present.  5. The inferior vena cava is normal in size with greater than 50% respiratory variability, suggesting right atrial pressure of 3 mmHg.  6. Agitated saline contrast bubble study was negative, with no evidence of any interatrial shunt. FINDINGS  Left Ventricle: Left ventricular ejection fraction, by estimation, is 60 to 65%. The left ventricle has normal function. The left ventricle has no regional wall motion abnormalities. The left ventricular internal cavity size was normal in size. There is  no left ventricular hypertrophy. Left ventricular diastolic parameters were normal. Right Ventricle: Area of lateral apical RV hypokinesis. The right ventricular size is mildly enlarged. No increase in right ventricular wall thickness. Right ventricular systolic function is mildly reduced. Left Atrium: Left atrial size was normal in size. Right Atrium: Right atrial size was normal in size. Pericardium: There is no evidence of pericardial effusion. Mitral Valve: The mitral valve is normal in structure. No evidence of mitral valve regurgitation. No evidence of mitral valve stenosis. Tricuspid Valve: The  tricuspid valve is normal in structure. Tricuspid valve regurgitation is not demonstrated. No  evidence of tricuspid stenosis. Aortic Valve: The aortic valve is tricuspid. Aortic valve regurgitation is not visualized. No aortic stenosis is present. Pulmonic Valve: The pulmonic valve was normal in structure. Pulmonic valve regurgitation is not visualized. No evidence of pulmonic stenosis. Aorta: The aortic root is normal in size and structure. Venous: The inferior vena cava is normal in size with greater than 50% respiratory variability, suggesting right atrial pressure of 3 mmHg. IAS/Shunts: No atrial level shunt detected by color flow Doppler. Agitated saline contrast was given intravenously to evaluate for intracardiac shunting. Agitated saline contrast bubble study was negative, with no evidence of any interatrial shunt.  LEFT VENTRICLE PLAX 2D LVIDd:         4.20 cm   Diastology LVIDs:         2.50 cm   LV e' medial:    11.80 cm/s LV PW:         1.10 cm   LV E/e' medial:  6.3 LV IVS:        0.90 cm   LV e' lateral:   15.00 cm/s LVOT diam:     2.10 cm   LV E/e' lateral: 5.0 LV SV:         52 LV SV Index:   25 LVOT Area:     3.46 cm  RIGHT VENTRICLE             IVC RV Basal diam:  2.20 cm     IVC diam: 1.50 cm RV S prime:     14.00 cm/s TAPSE (M-mode): 1.6 cm LEFT ATRIUM             Index        RIGHT ATRIUM           Index LA diam:        3.00 cm 1.43 cm/m   RA Area:     12.70 cm LA Vol (A2C):   33.5 ml 15.93 ml/m  RA Volume:   30.90 ml  14.70 ml/m LA Vol (A4C):   24.9 ml 11.84 ml/m LA Biplane Vol: 30.0 ml 14.27 ml/m  AORTIC VALVE LVOT Vmax:   86.00 cm/s LVOT Vmean:  57.600 cm/s LVOT VTI:    0.150 m  AORTA Ao Root diam: 3.30 cm Ao Asc diam:  2.90 cm MITRAL VALVE MV Area (PHT): 3.99 cm    SHUNTS MV Decel Time: 190 msec    Systemic VTI:  0.15 m MV E velocity: 74.80 cm/s  Systemic Diam: 2.10 cm MV A velocity: 54.80 cm/s MV E/A ratio:  1.36 Charlton Haws MD Electronically signed by Charlton Haws MD Signature  Date/Time: 06/13/2021/4:50:26 PM    Final     EKG: Independently reviewed.  NSR with rate 99; nonspecific ST changes with no evidence of acute ischemia   Labs on Admission: I have personally reviewed the available labs and imaging studies at the time of the admission.  Pertinent labs:  wbc: 17.8,  platelets: 413,    Assessment/Plan * CVA (cerebral vascular accident) (HCC)- (present on admission) 36 year old with hx of newly diagnosed HTN presenting with fatigue, vision changes and dizziness found to have acute infarct in right cerebellum.  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 until  bedside swallow screen-passed, diet ordered  -PT/ OT consult   Essential hypertension- (present on admission) Out of 24-48 hour window for permissive HTN  -bp have been decent, will hold medication at this time with PRN IV parameters, may need lower dose of losartan.  Was tried on norvasc for 3-4 days with no change in blood pressure so this was changed to losartan and hctz.  Hold hctz at this time and would not do 50mg , little benefit of higher doses with higher risks of SE.  -consider renal US if blood pressure not responding as we would expect w   Fatigue and weakness - (present on admission) Could be secondary to CVA vs. Uncontrolled HTN -check TSH, B12 and ANA for other underlying causes   Leukocytosis- (present on admission) Likely reactive and appears a bit hemoconcentrated. No clinical signs or symptoms of infection  Give small bolus of NS Trend fever curve and trend   Hyponatremia- (present on admission) Mild. Small bolus, hold hctz Trend      Body mass index is 36.32 kg/m.    Level of care: Telemetry  Medical DVT prophylaxis:  Lovenox  Code Status:  Full - confirmed with patient Family Communication: None present Disposition Plan:  The patient is from: home  Anticipated d/c is to: home   Patient placed in observation as anticipate less than 2 midnight stay. Requires hospitalization for acute stroke work up, observation and close monitoring and MDM with specialists.    Patient is currently: stable Consults called: neurology by edp  Admission status:  observation    Orland MustardAllison Evona Westra MD [facility]   How to contact the [facility] Attending or Consulting provider 7A - 7P or covering provider during after hours 7P -7A, for this patient?  Check the care team in [facility] and look for a) attending/consulting [facility] provider listed and b) the [facility] team listed Log into [email or web address] and use [facility] universal password to access. If you do not have the password, please contact the hospital operator. Locate the [facility] provider you are looking for under [facility] and page to a number that you can be directly reached. If you still have difficulty reaching the provider, please page the [facility] (Director on Call) for the Hospitalists listed on amion for assistance.   06/13/2021, 7:04 PM

## 2021-06-13 NOTE — ED Notes (Signed)
Patient transported to CT 

## 2021-06-13 NOTE — ED Provider Notes (Signed)
[facility] EMERGENCY DEPARTMENT Provider Note   CSN: [id number] Arrival date & time: 06/12/21  1728     History  Chief Complaint  Patient presents with   Hypertension    Tonya [name] is a 36 y.o. female presenting for evaluation of headache, dizziness, vision changes.  Patient states Friday, week ago, she awoke from sleep with severe dizziness.  States the entire room was spinning.  She then developed a occipital headache.  She took some ibuprofen, headache improved enough for her to fall back asleep.  When she woke up, she was having vision difficulties, not improved with glasses.  Over the past week, she has continued to have issues with headache, dizziness, vision issues.  She noted that her blood pressure was elevated, 170s over 1 high 1 teens.  She had a televisit with her doctor, was started on amlodipine.  She does have a history of high blood pressure in her 20s, used to be on blood pressure medication, however had episodes of hypotension and therefore blood pressure medication was stopped.  It has been well controlled without medication since.  She does have a history of gestational diabetes, and occasionally has elevated blood sugars, but no formal diagnosis diabetes.  She denies tobacco, alcohol, drug use.  She used to vape, but quit.   She denies recent cold symptoms.  No fever, nasal congestion, sore throat, cough, chest pain, shortness of breath, nausea, vomiting, abd pain, urinary symptoms, abnormal bowel movements.  She states that when she is very dizzy, she does get nauseous.  Additionally, patient states she has been extremely fatigued and weak.  With any exertion, she reports diffuse weakness, but no shortness of breath.  Currently patient has a dull headache which she has been taking ibuprofen for.  She reports intermittent continued floaters, mostly of the right eye, occasionally her right eyes blurry.  No vision loss.  Dizziness is mostly when she  looks towards the ceiling or when she changes position.  HPI     Home Medications Prior to Admission medications   Medication Sig Start Date End Date Taking? Authorizing Provider  gabapentin (NEURONTIN) 100 MG capsule Take 1 capsule (100 mg total) by mouth 3 (three) times daily. Patient taking differently: Take 100 mg by mouth daily.  01/23/20   Marybelle Killings, MD  HYDROcodone-acetaminophen (NORCO/VICODIN) 5-325 MG tablet Take 1 tablet by mouth every 6 (six) hours as needed for moderate pain. 05/21/20   Marybelle Killings, MD  methocarbamol (ROBAXIN) 500 MG tablet Take 1 tablet (500 mg total) by mouth every 6 (six) hours as needed for muscle spasms. 05/10/20   Lanae Crumbly, PA-C  naproxen (NAPROSYN) 500 MG tablet Take 1 tablet (500 mg total) by mouth 2 (two) times daily with a meal. 10/06/20   Jaynee Eagles, PA-C  oxyCODONE-acetaminophen (PERCOCET/ROXICET) 5-325 MG tablet Take 1-2 tablets by mouth every 4 (four) hours as needed for severe pain. 05/10/20   Lanae Crumbly, PA-C  ranitidine (ZANTAC) 75 MG tablet Take 75 mg by mouth as needed for heartburn. Take with ibuprofen in the mornings as needed for headaches    [provider]      Allergies    Green dyes and Ibuprofen    Review of Systems   Review of Systems  Eyes:  Positive for visual disturbance.  Neurological:  Positive for dizziness and numbness.  All other systems reviewed and are negative.  Physical Exam Updated Vital Signs BP (!) 142/111 (BP Location:  Right Arm)    Pulse (!) 104    Temp 98.3 F (36.8 C) (Oral)    Resp 16    Ht 5\' 6"  (1.676 m)    Wt 102.1 kg    LMP 03/02/2017 (LMP Unknown)    SpO2 100%    BMI 36.32 kg/m  Physical Exam Vitals and nursing note reviewed.  Constitutional:      General: She is not in acute distress.    Appearance: Normal appearance.  HENT:     Head: Normocephalic and atraumatic.     Comments: Mild horizontal nystagmus when looking side to side, resolves with time. Otherwise EOMI and  PERRLA Eyes:     General: No visual field deficit.    Conjunctiva/sclera: Conjunctivae normal.     Pupils: Pupils are equal, round, and reactive to light.  Cardiovascular:     Rate and Rhythm: Regular rhythm. Tachycardia present.     Pulses: Normal pulses.  Pulmonary:     Effort: Pulmonary effort is normal. No respiratory distress.     Breath sounds: Normal breath sounds. No wheezing.     Comments: Speaking in full sentences.  Clear lung sounds in all fields. Abdominal:     General: There is no distension.     Palpations: Abdomen is soft. There is no mass.     Tenderness: There is no abdominal tenderness. There is no guarding or rebound.  Musculoskeletal:        General: Normal range of motion.     Cervical back: Normal range of motion and neck supple.  Skin:    General: Skin is warm and dry.     Capillary Refill: Capillary refill takes less than 2 seconds.  Neurological:     Mental Status: She is alert and oriented to person, place, and time.     GCS: GCS eye subscore is 4. GCS verbal subscore is 5. GCS motor subscore is 6.     Cranial Nerves: Cranial nerves 2-12 are intact. No cranial nerve deficit, dysarthria or facial asymmetry.     Sensory: Sensation is intact. No sensory deficit.     Motor: Motor function is intact. No weakness or pronator drift.     Coordination: Coordination is intact. Romberg sign negative. Coordination normal. Finger-Nose-Finger Test normal.     Comments: No neurologic deficits noted.  CN intact.  Nose to finger intact.  Fine movement and coordination intact.  Slight difficulty with heel-to-toe walking, but otherwise normal gait.  Strength and sensation intact x4.  Negative pronator drift.  Negative Romberg.  Psychiatric:        Mood and Affect: Mood and affect normal.        Speech: Speech normal.        Behavior: Behavior normal.     ED Results / Procedures / Treatments   Labs (all labs ordered are listed, but only abnormal results are  displayed) Labs Reviewed  BASIC METABOLIC PANEL - Abnormal; Notable for the following components:      Result Value   Sodium 133 (*)    Chloride 97 (*)    Glucose, Bld 100 (*)    All other components within normal limits  CBC WITH DIFFERENTIAL/PLATELET - Abnormal; Notable for the following components:   WBC 17.8 (*)    RBC 5.25 (*)    Hemoglobin 15.6 (*)    HCT 47.1 (*)    Platelets 413 (*)    Neutro Abs 12.2 (*)    Lymphs Abs 4.2 (*)  All other components within normal limits  URINALYSIS, ROUTINE W REFLEX MICROSCOPIC - Abnormal; Notable for the following components:   Color, Urine STRAW (*)    Hgb urine dipstick SMALL (*)    All other components within normal limits  RESP PANEL BY RT-PCR (FLU A&B, COVID) ARPGX2  RAPID URINE DRUG SCREEN, HOSP PERFORMED  LIPID PANEL  HEMOGLOBIN A1C  HIV ANTIBODY (ROUTINE TESTING W REFLEX)  PREGNANCY, URINE  TROPONIN I (HIGH SENSITIVITY)  TROPONIN I (HIGH SENSITIVITY)    EKG EKG Interpretation  Date/Time:  Thursday June 12 2021 18:29:12 EST Ventricular Rate:  99 PR Interval:  126 QRS Duration: 84 QT Interval:  346 QTC Calculation: 444 R Axis:   70 Text Interpretation: Normal sinus rhythm Normal ECG When compared with ECG of 06-May-2020 15:22, no significant change Confirmed by Aletta Edouard [phone]) on 06/13/2021 11:46:04 AM  Radiology CT ANGIO HEAD NECK W WO CM  Result Date: 06/13/2021 CLINICAL DATA:  Stroke follow-up EXAM: CT ANGIOGRAPHY HEAD AND NECK TECHNIQUE: Multidetector CT imaging of the head and neck was performed using the standard protocol during bolus administration of intravenous contrast. Multiplanar CT image reconstructions and MIPs were obtained to evaluate the vascular anatomy. Carotid stenosis measurements (when applicable) are obtained utilizing NASCET criteria, using the distal internal carotid diameter as the denominator. CONTRAST:  61mL OMNIPAQUE IOHEXOL 350 MG/ML SOLN COMPARISON:  CT head 06/12/2021, MRI head  06/13/2021 FINDINGS: CT HEAD FINDINGS Brain: Redemonstrated areas of hypodensity in the right cerebellum, which correlate with acute and subacute infarcts on same-day MRI. No new area of hypodensity. No acute hemorrhage, mass, mass effect, or midline shift. No significant mass effect on the fourth ventricle. No hydrocephalus or extra-axial collection. Vascular: No hyperdense vessel. Skull: Normal. Negative for fracture or focal lesion. Sinuses: Mucous retention cysts in the right maxillary sinus. Mild mucosal thickening in the ethmoid air cells. Orbits: Negative. CTA NECK FINDINGS Aortic arch: Standard branching. Imaged portion shows no evidence of aneurysm or dissection. No significant stenosis of the major arch vessel origins. Right carotid system: No evidence of dissection, stenosis (50% or greater) or occlusion. Left carotid system: No evidence of dissection, stenosis (50% or greater) or occlusion. Vertebral arteries: Diminutive right vertebral artery, from its origin to the vertebrobasilar junction. Normal right vertebral artery. No evidence of dissection, stenosis, or occlusion. Skeleton: No acute osseous abnormality. Other neck: Negative. Upper chest: Negative. Review of the MIP images confirms the above findings CTA HEAD FINDINGS Anterior circulation: Both internal carotid arteries are patent to the termini, without stenosis or other abnormality. A1 segments patent. Normal anterior communicating artery. Anterior cerebral arteries are patent to their distal aspects. No M1 stenosis or occlusion. Normal MCA bifurcations. Distal MCA branches perfused and symmetric. Posterior circulation: Vertebral arteries patent to the vertebrobasilar junction without stenosis. Posterior inferior cerebral arteries patent bilaterally. Basilar patent to its distal aspect. Superior cerebellar arteries patent bilaterally. Bilateral P1 segments originate from the basilar artery. PCAs perfused to their distal aspects without  stenosis. The posterior communicating arteries are not visualized. Venous sinuses: As permitted by contrast timing, patent. Anatomic variants: None significant Review of the MIP images confirms the above findings IMPRESSION: 1. No evidence of dissection. 2.  No intracranial large vessel occlusion or significant stenosis. 3.  No hemodynamically significant stenosis in the neck. 4. Redemonstrated hypodensity in the right cerebellar hemisphere, consistent with the acute and subacute infarcts seen on the same-day MRI. Electronically Signed   By: Merilyn Baba M.D.   On: 06/13/2021 13:38  DG Chest 2 View  Result Date: 06/12/2021 CLINICAL DATA:  Chest pain, hypertension. EXAM: CHEST - 2 VIEW COMPARISON:  03/31/2007. FINDINGS: The heart size and mediastinal contours are within normal limits. Both lungs are clear. No acute osseous abnormality. IMPRESSION: No active cardiopulmonary disease. Electronically Signed   By: Brett Fairy M.D.   On: 06/12/2021 20:53   CT Head Wo Contrast  Result Date: 06/12/2021 CLINICAL DATA:  Headache, hypertension, dizziness, and weakness. EXAM: CT HEAD WITHOUT CONTRAST TECHNIQUE: Contiguous axial images were obtained from the base of the skull through the vertex without intravenous contrast. COMPARISON:  None. FINDINGS: Brain: No acute intracranial hemorrhage, midline shift or mass effect. No extra-axial fluid collection. Gray-white matter differentiation is within normal limits and there is no hydrocephalus. There is a wedge-shaped hypodensity in the inferior aspect of the cerebellum on the right. Vascular: No hyperdense vessel or unexpected calcification. Skull: Normal. Negative for fracture or focal lesion. Sinuses/Orbits: Mild mucosal thickening is present in the right maxillary sinus, left sphenoid sinus, and bilateral ethmoid air cells. The orbits are within normal limits. Other: None. IMPRESSION: Wedge-shaped hypodensity in the inferior aspect of the cerebellum on the right,  possible infarct of indeterminate age versus other etiology. MRI with contrast is recommended for further evaluation. Electronically Signed   By: Brett Fairy M.D.   On: 06/12/2021 20:52   MR Brain W and Wo Contrast  Result Date: 06/13/2021 CLINICAL DATA:  Dizziness, headache, weakness EXAM: MRI HEAD WITHOUT AND WITH CONTRAST TECHNIQUE: Multiplanar, multiecho pulse sequences of the brain and surrounding structures were obtained without and with intravenous contrast. CONTRAST:  9.44mL GADAVIST GADOBUTROL 1 MMOL/ML IV SOLN COMPARISON:  06/12/2021 CT head.  No prior MRI. FINDINGS: Brain: Several foci of restricted diffusion in the inferior right cerebellum, the largest of which measures up to 1.6 cm (series 4, image 12). These are associated with T2 hyperintense signal and contrast enhancement (series 11, images 6-10). An additional area of enhancement in the superior posterior right cerebellum (series 11, image 14), is not associated with abnormal signal on diffusion-weighted imaging, which has likely normalized. No other abnormal enhancement. No acute hemorrhage, mass, mass effect, or midline shift. No hydrocephalus or extra-axial collection. No hemosiderin deposition to suggest remote hemorrhage. Vascular: Normal flow voids. Skull and upper cervical spine: Normal marrow signal. Sinuses/Orbits: Mucous retention cysts in the right maxillary sinus. Mild mucosal thickening in the anterior ethmoid air cells. Otherwise negative. Other: The mastoids are well aerated. IMPRESSION: 1. Acute infarcts in the inferior right cerebellum, in the PICA territory, which demonstrate mildly increased signal on diffusion-weighted imaging, contrast enhancement, and T2 hyperintensity. 2. Subacute infarct in the superior posterior right cerebellum, in the SCA territory, with normalization on diffusion-weighted imaging but contrast enhancement and T2 hyperintense signal. Given multiple posterior vascular territories and different ages of  infarcts, consider a vertebral artery etiology; a CTA of the head and neck is recommended. These results were called by telephone at the time of interpretation on 06/13/2021 at 12:33 pm to provider DR. HORTON, who verbally acknowledged these results. Electronically Signed   By: Merilyn Baba M.D.   On: 06/13/2021 12:34    Procedures Procedures    Medications Ordered in ED Medications   stroke: mapping our early stages of recovery book (has no administration in time range)  acetaminophen (TYLENOL) tablet 650 mg (has no administration in time range)    Or  acetaminophen (TYLENOL) 160 MG/5ML solution 650 mg (has no administration in time range)  Or  acetaminophen (TYLENOL) suppository 650 mg (has no administration in time range)  senna-docusate (Senokot-S) tablet 1 tablet (has no administration in time range)  enoxaparin (LOVENOX) injection 40 mg (has no administration in time range)  gadobutrol (GADAVIST) 1 MMOL/ML injection 9.7 mL (9.7 mLs Intravenous Contrast Given 06/13/21 1149)  iohexol (OMNIPAQUE) 350 MG/ML injection 50 mL (50 mLs Intravenous Contrast Given 06/13/21 1318)    ED Course/ Medical Decision Making/ A&P                           Medical Decision Making  I saw patient over 19 hours after arrival to the ER.    This patient presents to the ED for concern of dizziness, weakness, vision changes. This involves an extensive number of treatment options, and is a complaint that carries with it a high risk of complications and morbidity.  The differential diagnosis includes stroke, TIA, migraine, infection, electrolyte abnormality, hypertensive urgency/emergency.   Co morbidities: Hypertension, obesity.   Lab Tests:  Labs obtained from triage interpreted by me.  Shows leukocytosis of 17.8.  Without fever or infectious symptoms, may be reactive.  Although patient does have low-grade temperature of 99.4 and is mildly tachycardic.  This may be due to anxiety as well.  Will look for  infection, though low suspicion. Otherwise labs are reassuring.   Imaging Studies:  CT head obtained from triage shows cerebellar abnormality.  As such, MRI was ordered, which shows both acute and subacute infarcts.  Recommend CTA of the head and neck which is ordered.  Chest x-ray viewed and independently interpreted by me, no pneumonia conotoxin effusion.  Cardiac Monitoring:  The patient was maintained on a cardiac monitor.  I personally viewed and interpreted the cardiac monitored which showed an underlying rhythm of: nsr   Consults:  I requested consultation with the neurology team.  Pt case discussed with Dr. Quinn Axe from neurology, who will evaluate the pt. Recommends admission to medicine.   Dispostion:  After consideration of the diagnostic results and the patients response to treatment, I feel that the patent would benefit from inpatient management of acute stroke.   Discussed with Dr. Rogers Blocker from [facility] hospitalist service, patient to be admitted.  Final Clinical Impression(s) / ED Diagnoses Final diagnoses:  Cerebrovascular accident (CVA), unspecified mechanism [facility])    Rx / DC Orders ED Discharge Orders     None         Franchot Heidelberg, PA-C 06/13/21 1358    Gareth Morgan, MD 06/13/21 2230

## 2021-06-13 NOTE — Assessment & Plan Note (Addendum)
Likely reactive and appears a bit hemoconcentrated. No clinical signs or symptoms of infection  Give small bolus of NS Trend fever curve and trend

## 2021-06-13 NOTE — Progress Notes (Signed)
°  Echocardiogram 2D Echocardiogram has been performed.  Delcie Roch 06/13/2021, 3:56 PM

## 2021-06-13 NOTE — Consult Note (Addendum)
Neurology Consultation  Reason for Consult: Evaluation of HA with subacute weakness and dizziness Referring Physician: Orland Mustard, MD  CC: HA, dizziness, weakness and floaters  History is obtained from: Patient  HPI: Tonya [name] is a 36 y.o. female with a PMHx of migraines and GERD, and a remote hx of HTN resolved by weight loss who self-presented from home and consulted to neurology for evaluation of subacute onset of worsening dizziness, weakness and HA. Patient reports that when she went to bed 12/29, everything was normal but upon waking 12/30, she was profoundly dizzy, had an ataxic gait (R sided preference), and had a sharp pain localized to the R sided parietal region of her head. She went back to sleep and awoke with floaters, on both sides. Her blood pressure obtained from her sister that afternoon was 156/100 and 178/117 later that evening. She contacted her PCP for a telehealth appointment who prescribed Norvasc 5 mg. Over the weekend, she still felt weak, had intermittent dizziness w/ nausea, and had a HA. On Tuesday, 1/3, the Norvasc was d/c and switched to HCTZ. On 1/4, she still felt "bad" with no changes to her symptoms, so her PCP recommended she go to [facility] to have labs drawn (still no results from this draw). She presented to the ED on 1/5 for evaluation, as sx still with no improvement. Over this course, she reports additional sx of chest pain and palpitations, weakness of all 4 extremities R>L,  proprioception disturbances feeling as though she is "floating in slow motion."   On today's assessment, she reports some residual dizziness and nausea, as well as a mild headache.    LKW: 12/29 prior to bed TNK given?: no, outside of time frame IR Thrombectomy? No, not indicated at this time Modified Rankin Scale: 1-No significant post stroke disability and can perform usual duties with stroke symptoms  ROS: A complete ROS was performed and is negative except as noted in the  HPI.  Unable to obtain due to altered mental status.   Past Medical History:  Diagnosis Date   Cystitis    Occas   Dizziness    Frequent UTI    GERD (gastroesophageal reflux disease)    Hand tingling    And feet   Headache(784.0)    Hypertension    no meds, patient reports this only while pregnant, not currently   PONV (postoperative nausea and vomiting)    Rapid heart rate    during pregnancy only   Seasonal allergies      Family History  Problem Relation Age of Onset   Diabetes Paternal Uncle    Hypertension Paternal Grandmother    Obesity Other    Sleep apnea Other    Hypertension Maternal Grandmother    Hypertension Maternal Grandfather    Diabetes Maternal Grandfather    Stroke Maternal Grandfather    Hypertension Paternal Grandfather    Thrombophlebitis Paternal Grandfather    Hyperlipidemia Paternal Grandfather   Migraines- Mom Stroke- Grandfather  CHF- Dad CHF- Grandfather Vertigo- Unknown familial relation   Social History:   reports that she quit smoking about 8 years ago. Her smoking use included cigarettes. She has never used smokeless tobacco. She reports current alcohol use. She reports that she does not use drugs. She has been using a vape apparatus with last use within the past week.  Medications  Current Facility-Administered Medications:     stroke: mapping our early stages of recovery book, , Does not apply, Once, Orland Mustard, MD  acetaminophen (TYLENOL) tablet 650 mg, 650 mg, Oral, Q4H PRN **OR** acetaminophen (TYLENOL) 160 MG/5ML solution 650 mg, 650 mg, Per Tube, Q4H PRN **OR** acetaminophen (TYLENOL) suppository 650 mg, 650 mg, Rectal, Q4H PRN, Orma Flaming, MD   Derrill Memo ON 06/14/2021] aspirin EC tablet 81 mg, 81 mg, Oral, Daily, Orma Flaming, MD   clopidogrel (PLAVIX) tablet 75 mg, 75 mg, Oral, Daily, Johnchristopher Sarvis, MD   enoxaparin (LOVENOX) injection 40 mg, 40 mg, Subcutaneous, Q24H, Orma Flaming, MD, 40 mg at 06/13/21 1603    losartan (COZAAR) tablet 100 mg, 100 mg, Oral, QHS, Orma Flaming, MD   ondansetron [facility]) injection 4 mg, 4 mg, Intravenous, Q6H PRN, Orma Flaming, MD, 4 mg at 06/13/21 1602   senna-docusate (Senokot-S) tablet 1 tablet, 1 tablet, Oral, QHS PRN, Orma Flaming, MD  Current Outpatient Medications:    fluticasone (FLONASE) 50 MCG/ACT nasal spray, Place 1 spray into both nostrils daily as needed for allergies or rhinitis., Disp: , Rfl:    hydrochlorothiazide (HYDRODIURIL) 25 MG tablet, Take 50 mg by mouth daily., Disp: , Rfl:    Ibuprofen (ADVIL PO), Take 1 tablet by mouth every 6 (six) hours as needed (headache)., Disp: , Rfl:    losartan (COZAAR) 100 MG tablet, Take 100 mg by mouth daily., Disp: , Rfl:    gabapentin (NEURONTIN) 100 MG capsule, Take 1 capsule (100 mg total) by mouth 3 (three) times daily. (Patient not taking: Reported on 06/13/2021), Disp: 60 capsule, Rfl: 0   HYDROcodone-acetaminophen (NORCO/VICODIN) 5-325 MG tablet, Take 1 tablet by mouth every 6 (six) hours as needed for moderate pain. (Patient not taking: Reported on 06/13/2021), Disp: 30 tablet, Rfl: 0   methocarbamol (ROBAXIN) 500 MG tablet, Take 1 tablet (500 mg total) by mouth every 6 (six) hours as needed for muscle spasms. (Patient not taking: Reported on 06/13/2021), Disp: 60 tablet, Rfl: 0   naproxen (NAPROSYN) 500 MG tablet, Take 1 tablet (500 mg total) by mouth 2 (two) times daily with a meal. (Patient not taking: Reported on 06/13/2021), Disp: 30 tablet, Rfl: 0   oxyCODONE-acetaminophen (PERCOCET/ROXICET) 5-325 MG tablet, Take 1-2 tablets by mouth every 4 (four) hours as needed for severe pain. (Patient not taking: Reported on 06/13/2021), Disp: 50 tablet, Rfl: 0   Exam: Current vital signs: BP (!) 142/111 (BP Location: Right Arm)    Pulse (!) 104    Temp 98.3 F (36.8 C) (Oral)    Resp 16    Ht 5\' 6"  (1.676 m)    Wt 102.1 kg    LMP 03/02/2017 (LMP Unknown)    SpO2 100%    BMI 36.32 kg/m  Vital signs in last 24  hours: Temp:  [98.3 F (36.8 C)-99.5 F (37.5 C)] 98.3 F (36.8 C) (01/06 1302) Pulse Rate:  [90-107] 104 (01/06 1302) Resp:  [15-20] 16 (01/06 1302) BP: (142-176)/(94-112) 142/111 (01/06 1302) SpO2:  [98 %-100 %] 100 % (01/06 1302) Weight:  [102.1 kg] 102.1 kg (01/06 1340)  GENERAL: Awake, alert, in no acute distress Psych: Affect appropriate for situation, patient is calm and cooperative with examination Head: Normocephalic and atraumatic, without obvious abnormality EENT: Normal conjunctivae, dry mucous membranes, no OP obstruction LUNGS: CTAB. Normal respiratory effort. Non-labored breathing on room air CV: Regular rate and rhythm with no abnormal heart sounds auscultated ABDOMEN: Soft, non-tender, non-distended Extremities: warm, well perfused, without obvious deformity  NEURO:  Mental Status: Awake, alert, and oriented to person, place, time, and situation. She is able to provide a clear and coherent  history of present illness. Speech/Language: speech is normal.   Naming, repetition, fluency, and comprehension intact without aphasia  No neglect is noted Cranial Nerves:  II: PERRL 2 mm/brisk. visual fields full.  III, IV, VI: EOMI, with some nystagmus bilaterally (believed to be physiologic). Lid elevation symmetric and full.  V: Sensation is intact to light touch and symmetrical to face. Blinks to threat. Moves jaw back and forth.  VII: Face is symmetric resting and smiling. Able to puff cheeks and raise eyebrows.  VIII: Hearing intact to voice IX, X: Palate elevation is symmetric. Phonation normal.  XI: Normal sternocleidomastoid and trapezius muscle strength XII: Tongue protrudes midline without fasciculations.   Motor: 5/5 strength is all muscle groups.  Tone is normal. Bulk is normal.  Sensation: Intact to light touch bilaterally in all four extremities. No extinction to DSS present.  Coordination: FTN intact bilaterally. HKS intact bilaterally. No pronator drift.  Alternating hand movements intact.  DTRs: 2+ throughout.  Gait: Normal tandem, heel-to-toe with minor incoordination, normal heel walk, normal toe walk. Romberg: borderline positive with some swaying at 10 seconds and even more at 30 seconds, but patient able to stand without moving feet nor falling over  NIHSS: 1a Level of Conscious.: 0 1b LOC Questions: 0 1c LOC Commands: 0 2 Best Gaze: 0 3 Visual: 0 4 Facial Palsy: 0 5a Motor Arm - left: 0 5b Motor Arm - Right: 0 6a Motor Leg - Left: 0 6b Motor Leg - Right: 0 7 Limb Ataxia: 0 8 Sensory: 0 9 Best Language: 0 10 Dysarthria: 0 11 Extinct. and Inatten.: 0 TOTAL: 0   Labs I have reviewed labs in epic and the results pertinent to this consultation are:   CBC    Component Value Date/Time   WBC 13.5 (H) 06/13/2021 1458   RBC 5.23 (H) 06/13/2021 1458   HGB 15.4 (H) 06/13/2021 1458   HCT 47.4 (H) 06/13/2021 1458   PLT 295 06/13/2021 1458   MCV 90.6 06/13/2021 1458   MCH 29.4 06/13/2021 1458   MCHC 32.5 06/13/2021 1458   RDW 12.0 06/13/2021 1458   LYMPHSABS 4.2 (H) 06/12/2021 1905   MONOABS 1.0 06/12/2021 1905   EOSABS 0.2 06/12/2021 1905   BASOSABS 0.1 06/12/2021 1905    CMP     Component Value Date/Time   NA 133 (L) 06/12/2021 1905   K 4.6 06/12/2021 1905   CL 97 (L) 06/12/2021 1905   CO2 25 06/12/2021 1905   GLUCOSE 100 (H) 06/12/2021 1905   BUN 12 06/12/2021 1905   CREATININE 0.73 06/12/2021 1905   CALCIUM 9.3 06/12/2021 1905   PROT 6.7 05/06/2020 1600   ALBUMIN 3.7 05/06/2020 1600   AST 19 05/06/2020 1600   ALT 14 05/06/2020 1600   ALKPHOS 73 05/06/2020 1600   BILITOT 0.4 05/06/2020 1600   GFRNONAA >60 06/12/2021 1905   GFRAA >60 04/10/2019 0544    Lipid Panel     Component Value Date/Time   CHOL 251 (H) 06/13/2021 1458   TRIG 146 06/13/2021 1458   HDL 41 06/13/2021 1458   CHOLHDL 6.1 06/13/2021 1458   VLDL 29 06/13/2021 1458   LDLCALC 181 (H) 06/13/2021 1458   LDLDIRECT 141.9 01/06/2012 0922      Imaging I have reviewed the images obtained:  CT-scan of the brain: With apparent hypodensity in R inferior cerebellum. No other lesions  MRI examination of the brain: With wedge-shaped infarct as well as multiple other small areas of infarction, all within R  cerebellum, inferior and posterior.  CTA Head and Neck: No vasculature with dissection, stenoses, nor occlusion.  Assessment: Jayd is with minor deficits in proprioception, but otherwise has a benign physical exam and appears to be with improvements in all other reported symptoms. 24 hr BP 142-176/94-112, MR 142/111. 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 RF: Hyperlipidemia (chol 251, LDL 181), smoker (vape, h/o cigarette smoking), migraines, HTN, obesity.   Recommendations: - HgbA1c pending, fasting lipid panel obtained - MRI, MRA of the brain without contrast, obtained - Frequent neuro checks - Echocardiogram, obtained - Prophylactic therapy-Antiplatelet med: Aspirin - dose 325mg  PO x1, then dual antiplatelet therapy with Aspirin 81 mg and Plavix 75 mg x 21 days; afterward continue only Aspirin 81 mg - Initiate high-intensity statin, Atorvastatin 80 mg daily. Patient with LDL 181. - Risk factor modification - Telemetry monitoring - PT/OT consult - Stroke team to follow  Pt seen by Neuro Psych resident MD and later by attending MD. Note/plan to be edited by MD as needed.   Rosezetta Schlatter, MD PGY-1 06/13/2021

## 2021-06-13 NOTE — ED Notes (Signed)
Patient is resting comfortably with visitor at [facility]. Updated on POC. Voices no needs at this time 3W called to place purple man on pt's chart so report can be verified

## 2021-06-14 ENCOUNTER — Encounter: Payer: Self-pay | Admitting: Family Medicine

## 2021-06-14 ENCOUNTER — Inpatient Hospital Stay (HOSPITAL_COMMUNITY): Payer: PRIVATE HEALTH INSURANCE

## 2021-06-14 DIAGNOSIS — E871 Hypo-osmolality and hyponatremia: Secondary | ICD-10-CM | POA: Diagnosis present

## 2021-06-14 DIAGNOSIS — Z20822 Contact with and (suspected) exposure to covid-19: Secondary | ICD-10-CM | POA: Diagnosis present

## 2021-06-14 DIAGNOSIS — Z83438 Family history of other disorder of lipoprotein metabolism and other lipidemia: Secondary | ICD-10-CM | POA: Diagnosis not present

## 2021-06-14 DIAGNOSIS — Z79899 Other long term (current) drug therapy: Secondary | ICD-10-CM | POA: Diagnosis not present

## 2021-06-14 DIAGNOSIS — E7800 Pure hypercholesterolemia, unspecified: Secondary | ICD-10-CM

## 2021-06-14 DIAGNOSIS — G43909 Migraine, unspecified, not intractable, without status migrainosus: Secondary | ICD-10-CM | POA: Diagnosis present

## 2021-06-14 DIAGNOSIS — R29701 NIHSS score 1: Secondary | ICD-10-CM | POA: Diagnosis present

## 2021-06-14 DIAGNOSIS — R5383 Other fatigue: Secondary | ICD-10-CM

## 2021-06-14 DIAGNOSIS — Z823 Family history of stroke: Secondary | ICD-10-CM | POA: Diagnosis not present

## 2021-06-14 DIAGNOSIS — K219 Gastro-esophageal reflux disease without esophagitis: Secondary | ICD-10-CM | POA: Diagnosis present

## 2021-06-14 DIAGNOSIS — K5900 Constipation, unspecified: Secondary | ICD-10-CM | POA: Diagnosis present

## 2021-06-14 DIAGNOSIS — E876 Hypokalemia: Secondary | ICD-10-CM | POA: Diagnosis present

## 2021-06-14 DIAGNOSIS — D72829 Elevated white blood cell count, unspecified: Secondary | ICD-10-CM

## 2021-06-14 DIAGNOSIS — F17290 Nicotine dependence, other tobacco product, uncomplicated: Secondary | ICD-10-CM | POA: Diagnosis present

## 2021-06-14 DIAGNOSIS — I63441 Cerebral infarction due to embolism of right cerebellar artery: Secondary | ICD-10-CM

## 2021-06-14 DIAGNOSIS — D751 Secondary polycythemia: Secondary | ICD-10-CM | POA: Diagnosis present

## 2021-06-14 DIAGNOSIS — Z886 Allergy status to analgesic agent status: Secondary | ICD-10-CM | POA: Diagnosis not present

## 2021-06-14 DIAGNOSIS — I639 Cerebral infarction, unspecified: Secondary | ICD-10-CM | POA: Diagnosis not present

## 2021-06-14 DIAGNOSIS — Z6836 Body mass index (BMI) 36.0-36.9, adult: Secondary | ICD-10-CM | POA: Diagnosis not present

## 2021-06-14 DIAGNOSIS — E785 Hyperlipidemia, unspecified: Secondary | ICD-10-CM | POA: Diagnosis present

## 2021-06-14 DIAGNOSIS — Z981 Arthrodesis status: Secondary | ICD-10-CM | POA: Diagnosis not present

## 2021-06-14 DIAGNOSIS — F17200 Nicotine dependence, unspecified, uncomplicated: Secondary | ICD-10-CM

## 2021-06-14 DIAGNOSIS — I10 Essential (primary) hypertension: Secondary | ICD-10-CM | POA: Diagnosis present

## 2021-06-14 DIAGNOSIS — Z833 Family history of diabetes mellitus: Secondary | ICD-10-CM | POA: Diagnosis not present

## 2021-06-14 DIAGNOSIS — R519 Headache, unspecified: Secondary | ICD-10-CM | POA: Diagnosis present

## 2021-06-14 DIAGNOSIS — Z8249 Family history of ischemic heart disease and other diseases of the circulatory system: Secondary | ICD-10-CM | POA: Diagnosis not present

## 2021-06-14 DIAGNOSIS — Z8632 Personal history of gestational diabetes: Secondary | ICD-10-CM | POA: Diagnosis not present

## 2021-06-14 DIAGNOSIS — Z9102 Food additives allergy status: Secondary | ICD-10-CM | POA: Diagnosis not present

## 2021-06-14 DIAGNOSIS — E669 Obesity, unspecified: Secondary | ICD-10-CM | POA: Diagnosis present

## 2021-06-14 DIAGNOSIS — I63541 Cerebral infarction due to unspecified occlusion or stenosis of right cerebellar artery: Secondary | ICD-10-CM | POA: Diagnosis present

## 2021-06-14 LAB — RPR: RPR Ser Ql: NONREACTIVE

## 2021-06-14 LAB — HEMOGLOBIN A1C
Hgb A1c MFr Bld: 5 % (ref 4.8–5.6)
Mean Plasma Glucose: 96.8 mg/dL

## 2021-06-14 MED ORDER — BISACODYL 10 MG RE SUPP: 10.0000 mg | Freq: Every day | RECTAL | Status: DC | PRN

## 2021-06-14 MED ORDER — ASPIRIN 81 MG PO TBEC: 81.0000 mg | DELAYED_RELEASE_TABLET | Freq: Every day | ORAL | 0 refills | Status: AC

## 2021-06-14 MED ORDER — CLOPIDOGREL BISULFATE 75 MG PO TABS: 75.0000 mg | ORAL_TABLET | Freq: Every day | ORAL | 0 refills | Status: AC

## 2021-06-14 MED ORDER — SENNOSIDES-DOCUSATE SODIUM 8.6-50 MG PO TABS: 1.0000 | ORAL_TABLET | Freq: Two times a day (BID) | ORAL | Status: DC

## 2021-06-14 MED ORDER — ATORVASTATIN CALCIUM 80 MG PO TABS: 80.0000 mg | ORAL_TABLET | Freq: Every day | ORAL | 0 refills | Status: DC

## 2021-06-14 MED ORDER — POLYETHYLENE GLYCOL 3350 17 G PO PACK
17.0000 g | PACK | Freq: Every day | ORAL | Status: DC
  Filled 2021-06-14: qty 1

## 2021-06-14 MED ORDER — HYDROCHLOROTHIAZIDE 25 MG PO TABS: 25.0000 mg | ORAL_TABLET | Freq: Every day | ORAL | 0 refills | Status: DC

## 2021-06-14 NOTE — Progress Notes (Signed)
VASCULAR LAB    TCD with bubbles has been performed.  See CV proc for preliminary results.   Tonya [name], RVT 06/14/2021, 5:11 PM

## 2021-06-14 NOTE — Discharge Instructions (Addendum)
We recommend one week rest at home given your medical condition and then part time work at home for one week and then full time work load after if you feel tolerating.  Will arrange follow up with neurology clinic in 4 weeks.  Also recommend heart monitoring for 2 weeks to rule out any irregular heart beat and outpatient heart ultrasound to look into heart condition in details. We will set up it for you.  Continue ASA and plavix for 3 weeks and then plavix alone. Continue lipitor for stroke prevention We will let you know if your blood work is abnormal after your discharge.  Quit vaping. Monitoring BP at home. Follow up with family doctor in 2 weeks and regularly.  Any new stroke like symptoms, please call 911 or go to nearest Emergency Room.

## 2021-06-14 NOTE — Evaluation (Signed)
Physical Therapy Evaluation Patient Details Name: Tonya [name] MRN: [id number] DOB: June 01, 1986 Today's Date: 06/14/2021  History of Present Illness  Pt is a 36 y.o. female who presented to ED 1/5 with fatigue, heaviness in limbs, headache, intermittent vision changes, and dizziness.  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 PMH: HTN , lumbar stenosis s/p fusion in 05/2020   Clinical Impression  PT eval complete. Pt is independent with all functional mobility, including ascend/descend flight of stairs with rail. 23/24 DGI, only demonstrating mild difficulty with change in gait speed. Pt reports just feeling generally sluggish and heavy. Reports no dizziness currently and no visual issues/concerns. No further PT intervention indicated. PT signing off.        Recommendations for follow up therapy are one component of a multi-disciplinary discharge planning process, led by the attending physician.  Recommendations may be updated based on patient status, additional functional criteria and insurance authorization.  Follow Up Recommendations No PT follow up    Assistance Recommended at Discharge PRN  Patient can return home with the following       Equipment Recommendations None recommended by PT  Recommendations for Other Services       Functional Status Assessment Patient has had a recent decline in their functional status and demonstrates the ability to make significant improvements in function in a reasonable and predictable amount of time.     Precautions / Restrictions Precautions Precautions: None      Mobility  Bed Mobility Overal bed mobility: Independent                  Transfers Overall transfer level: Independent Equipment used: None                    Ambulation/Gait Ambulation/Gait assistance: Independent Gait Distance (Feet): 350 Feet Assistive device:  None Gait Pattern/deviations: WFL(Within Functional Limits) Gait velocity: mildly decreased Gait velocity interpretation: >2.62 ft/sec, indicative of community ambulatory   General Gait Details: steady gait  Stairs Stairs: Yes Stairs assistance: Modified independent (Device/Increase time) Stair Management: One rail Right;Forwards;Alternating pattern Number of Stairs: 12    Wheelchair Mobility    Modified Rankin (Stroke Patients Only) Modified Rankin (Stroke Patients Only) Pre-Morbid Rankin Score: No symptoms Modified Rankin: Slight disability     Balance Overall balance assessment: Modified Independent                               Standardized Balance Assessment Standardized Balance Assessment : Dynamic Gait Index   Dynamic Gait Index Level Surface: Normal Change in Gait Speed: Mild Impairment Gait with Horizontal Head Turns: Normal Gait with Vertical Head Turns: Normal Gait and Pivot Turn: Normal Step Over Obstacle: Normal Step Around Obstacles: Normal Steps: Normal Total Score: 23       Pertinent Vitals/Pain Pain Assessment: No/denies pain    Home Living Family/patient expects to be discharged to:: Private residence Living Arrangements: Spouse/significant other;Children Available Help at Discharge: Family Type of Home: House Home Access: Stairs to enter Entrance Stairs-Rails: Doctor, general practice of Steps: 4   Home Layout: One level Home Equipment: Hand held shower head;Shower seat - built in;Cane - single point      Prior Function Prior Level of Function : Independent/Modified Independent;Driving;Working/employed                     Hand Dominance  Dominant Hand: Right    Extremity/Trunk Assessment   Upper Extremity Assessment Upper Extremity Assessment: Defer to OT evaluation    Lower Extremity Assessment Lower Extremity Assessment: Overall WFL for tasks assessed (symmetrical)    Cervical / Trunk  Assessment Cervical / Trunk Assessment: Normal  Communication   Communication: No difficulties  Cognition Arousal/Alertness: Awake/alert Behavior During Therapy: WFL for tasks assessed/performed Overall Cognitive Status: Within Functional Limits for tasks assessed                                          General Comments General comments (skin integrity, edema, etc.): VSS on RA    Exercises     Assessment/Plan    PT Assessment Patient does not need any further PT services  PT Problem List         PT Treatment Interventions      PT Goals (Current goals can be found in the Care Plan section)  Acute Rehab PT Goals Patient Stated Goal: home PT Goal Formulation: All assessment and education complete, DC therapy    Frequency       Co-evaluation               AM-PAC PT "6 Clicks" Mobility  Outcome Measure Help needed turning from your back to your side while in a flat bed without using bedrails?: None Help needed moving from lying on your back to sitting on the side of a flat bed without using bedrails?: None Help needed moving to and from a bed to a chair (including a wheelchair)?: None Help needed standing up from a chair using your arms (e.g., wheelchair or bedside chair)?: None Help needed to walk in hospital room?: None Help needed climbing 3-5 steps with a railing? : None 6 Click Score: 24    End of Session   Activity Tolerance: Patient tolerated treatment well Patient left: in bed;with call bell/phone within reach Nurse Communication: Mobility status PT Visit Diagnosis: Unsteadiness on feet (R26.81)    Time: 4193-7902 PT Time Calculation (min) (ACUTE ONLY): 20 min   Charges:   PT Evaluation $PT Eval Moderate Complexity: 1 Mod          Aida Raider, PT  Office # [phone] Pager [phone]   Ilda Foil 06/14/2021, 8:30 AM

## 2021-06-14 NOTE — Discharge Summary (Signed)
Physician Discharge Summary  Tonya [name] ZOX:[id number] DOB: 1985-08-29 DOA: 06/12/2021  PCP: Lianne Moris, PA-C  Admit date: 06/12/2021 Discharge date: 06/14/2021  Admitted From: Home Disposition: Home   Recommendations for Outpatient Follow-up:  Follow up with PCP in 1-2 weeks for continued management of HTN, HLD, obesity.  Note HCTZ dose decreased to 25mg  daily due to hyponatremia, hypokalemia, and limited benefit of higher doses. Consider semaglutide and/or referral to dietitian  Recommend following LFTs and lipid panel with initiation of atorvastatin 80mg  daily Recommend repeat BMP and CBC. Please also follow up on hypercoagulability panel which is pending at discharge. Follow up with cardiology who will arrange TEE and ziopatch for completion of work up as an outpatient.  Follow up with neurology as outpatient in 6-8 weeks.  Home Health: None Equipment/Devices: None Discharge Condition: Stable CODE STATUS: Full Diet recommendation: Heart healthy  Brief/Interim Summary: Tonya [name] is a 36 y.o. female with a history of obesity, migraines, vaping, recently started on HCTZ, losartan for elevated BP who presented to the ED 1/5 with fatigue, heaviness in the limbs, dizziness and intermittent vision changes. She woke up 12/30 with severe dizziness in bed, got up with unsteady gait and later had a stabbing right sided headache improved with ibuprofen. Later she had floaters and trouble focusing on computer screen and has since had intermittent dizziness, proprioceptive disturbances ("floating in slow motion"), and intermittent blurry vision. Also recently diagnosed with high blood pressure, started on norvasc which was ineffective and changed to HCTZ 50mg  and losartan 100mg . Due to continued progressive heaviness in extremities and increasing fatigue, she presented to the ED on 1/5 where she was afebrile, hypertensive to 176/112 with WBC 17.8k. CT head showed wedge shaped hypodensity in  the inferior aspect of the cerebellum on right. Subsequent MRI revealed acute infarcts in the inferior right cerebellum, PICA territory. Subacute infarct in superior posterior right cerebellum, SCA territory. No LVO, stenosis or dissection on CTA. Neurology was consulted and the patient was admitted to [facility]. Echocardiogram revealed LVEF 60-65%, no cardioembolic source. LDL noted to be 181 for which statin therapy was started in addition to antiplatelet therapy guided by stroke neurology. Hypercoagulability work up is pending at time of discharge.   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 Negative echo/bubble study. TCD negative. LE venous U/S negative.  - Hypercoagulability labs pending. No personal/FH of DVTs or stroke (grandfather on eliquis for AFib) - DAPT x3 weeks, then ASA, continue lipitor.  - Will need TEE and cardiac monitoring as outpatient. Discharged on weekend, so this referral made 1/9.  - Follow up with neurology in 4 weeks.  - No PT or OT follow up recommended.   Fatigue: Likely related to infarcts. B12, TSH, UA, UDS, HIV, RPR negative.   HTN: Could be exacerbated due to infarcts - Restart losartan 100mg  - Restart HCTZ at 25mg  dose - Follow up with PCP   Area of lateral apical RV hypokinesis: Noted on echocardiogram with no LV wall abnormalities. IVC small and collapsible. Negative troponin. ECG is unremarkable. Discussed with cardiology, Dr. Jens Som.  - Consider repeat echocardiogram in the future. No further evaluation or treatment is required at this time.    Hyperlipidemia: LDL 181.  - Continue high-intensity statin started this admission. May need additional agent to get <  70, though lifestyle modification will also help.   Obesity:  Estimated body mass index is 36.32 kg/m as calculated from the following:   Height as of this encounter: 5\' 6"  (1.676 m).   Weight as of this encounter: 102.1 kg. - Patient intends to enact significant lifestyle modifications to improve this risk factor. PCP follow up, outpatient dietitian recommended. Consider semaglutide.    Leukocytosis: Suspected to be reactive in afebrile pt here with stroke, trending downward without antimicrobial Tx.    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 Has not been chronic (though has been noted previously). ?if secondary to smoking/vaping.  - Recheck CBC at follow up and consider further work up if persistent.   Hypokalemia: Supplemented   Hyponatremia: Mild. Monitor at follow up, anticipate improvement with decreased dose of HCTZ.    Constipation:  - Stool regimen ordered   Lumbar DDD s/p back surgery 2012, laminectomy, microdiscectomy Nov 2020, and lumbar stenosis s/p fusion Dec 2021   History of gestational diabetes: No longer needing Tx. HbA1c 5%.   Discharge Instructions Discharge Instructions     Diet - low sodium heart healthy   Complete by: As directed    Discharge instructions   Complete by: As directed    You were admitted with stroke with no definite cause found at this time. You are stable for discharge per neurology, though will need to continue a work up as an outpatient with a transesophageal echocardiogram and cardiac monitoring. These will be arranged through the cardiology office here, who will be open Monday morning. You also need to follow up with neurology in 6-8 weeks. Call [facility] if you do not hear from their office to schedule an appointment in the next few business days.  To reduce your risk of subsequent stroke:  - Take aspirin and plavix for the next 3 weeks, then stop plavix and take aspirin alone thereafter. - Take atorvastatin 80mg  once daily - It is recommended that you lower the dose of HCTZ  from 50mg  to 25mg  daily and follow up with your PCP for ongoing management of hypertension, hyperlipidemia, and obesity.  - If your symptoms return, seek medical attention right away.   Increase activity slowly   Complete by: As directed       Allergies as of 06/14/2021       Reactions   Green Dyes Hives   Ibuprofen Other (See Comments)   Esophagitis and chest pain - pt states that its ok to take with heartburn medication        Medication List     STOP taking these medications    gabapentin 100 MG capsule Commonly known as: NEURONTIN   HYDROcodone-acetaminophen 5-325 MG tablet Commonly known as: NORCO/VICODIN   methocarbamol 500 MG tablet Commonly known as: Robaxin   naproxen 500 MG tablet Commonly known as: NAPROSYN   oxyCODONE-acetaminophen 5-325 MG tablet Commonly known as: PERCOCET/ROXICET       TAKE these medications    ADVIL PO Take 1 tablet by mouth every 6 (six) hours as needed (headache).   aspirin 81 MG EC tablet Take 1 tablet (81 mg total) by mouth daily. Start taking on: June 15, 2021   atorvastatin 80 MG tablet Commonly known as: LIPITOR Take 1 tablet (80 mg total) by mouth daily. Start taking on: June 15, 2021   clopidogrel 75 MG tablet Commonly known as: PLAVIX Take 1 tablet (75 mg total) by mouth daily for 21 days. Start taking on: June 15, 2021  fluticasone 50 MCG/ACT nasal spray Commonly known as: FLONASE Place 1 spray into both nostrils daily as needed for allergies or rhinitis.   hydrochlorothiazide 25 MG tablet Commonly known as: HYDRODIURIL Take 1 tablet (25 mg total) by mouth daily. What changed: how much to take   losartan 100 MG tablet Commonly known as: COZAAR Take 100 mg by mouth daily.        Follow-up Information     Lianne MorisCarroll, Erin, PA-C. Schedule an appointment as soon as possible for a visit.   Specialty: Family Medicine Contact information: [address] [place] [address] [phone]          Micki RileySethi, Pramod S, MD. Schedule an appointment as soon as possible for a visit in 1 month(s).   Specialties: Neurology, Radiology Contact information: [address] [place] [address] [phone]                Allergies  Allergen Reactions   Green Dyes Hives   Ibuprofen Other (See Comments)    Esophagitis and chest pain - pt states that its ok to take with heartburn medication    Consultations: Neurology  Procedures/Studies: CT ANGIO HEAD NECK W WO CM  Result Date: 06/13/2021 CLINICAL DATA:  Stroke follow-up EXAM: CT ANGIOGRAPHY HEAD AND NECK TECHNIQUE: Multidetector CT imaging of the head and neck was performed using the standard protocol during bolus administration of intravenous contrast. Multiplanar CT image reconstructions and MIPs were obtained to evaluate the vascular anatomy. Carotid stenosis measurements (when applicable) are obtained utilizing NASCET criteria, using the distal internal carotid diameter as the denominator. CONTRAST:  50mL OMNIPAQUE IOHEXOL 350 MG/ML SOLN COMPARISON:  CT head 06/12/2021, MRI head 06/13/2021 FINDINGS: CT HEAD FINDINGS Brain: Redemonstrated areas of hypodensity in the right cerebellum, which correlate with acute and subacute infarcts on same-day MRI. No new area of hypodensity. No acute hemorrhage, mass, mass effect, or midline shift. No significant mass effect on the fourth ventricle. No hydrocephalus or extra-axial collection. Vascular: No hyperdense vessel. Skull: Normal. Negative for fracture or focal lesion. Sinuses: Mucous retention cysts in the right maxillary sinus. Mild mucosal thickening in the ethmoid air cells. Orbits: Negative. CTA NECK FINDINGS Aortic arch: Standard branching. Imaged portion shows no evidence of aneurysm or dissection. No significant stenosis of the major arch vessel origins. Right carotid system: No evidence of dissection, stenosis (50% or greater) or occlusion. Left carotid system: No evidence of  dissection, stenosis (50% or greater) or occlusion. Vertebral arteries: Diminutive right vertebral artery, from its origin to the vertebrobasilar junction. Normal right vertebral artery. No evidence of dissection, stenosis, or occlusion. Skeleton: No acute osseous abnormality. Other neck: Negative. Upper chest: Negative. Review of the MIP images confirms the above findings CTA HEAD FINDINGS Anterior circulation: Both internal carotid arteries are patent to the termini, without stenosis or other abnormality. A1 segments patent. Normal anterior communicating artery. Anterior cerebral arteries are patent to their distal aspects. No M1 stenosis or occlusion. Normal MCA bifurcations. Distal MCA branches perfused and symmetric. Posterior circulation: Vertebral arteries patent to the vertebrobasilar junction without stenosis. Posterior inferior cerebral arteries patent bilaterally. Basilar patent to its distal aspect. Superior cerebellar arteries patent bilaterally. Bilateral P1 segments originate from the basilar artery. PCAs perfused to their distal aspects without stenosis. The posterior communicating arteries are not visualized. Venous sinuses: As permitted by contrast timing, patent. Anatomic variants: None significant Review of the MIP images confirms the above findings IMPRESSION: 1. No evidence of dissection. 2.  No intracranial  large vessel occlusion or significant stenosis. 3.  No hemodynamically significant stenosis in the neck. 4. Redemonstrated hypodensity in the right cerebellar hemisphere, consistent with the acute and subacute infarcts seen on the same-day MRI. Electronically Signed   By: Wiliam KeAlison  Vasan M.D.   On: 06/13/2021 13:38   DG Chest 2 View  Result Date: 06/12/2021 CLINICAL DATA:  Chest pain, hypertension. EXAM: CHEST - 2 VIEW COMPARISON:  03/31/2007. FINDINGS: The heart size and mediastinal contours are within normal limits. Both lungs are clear. No acute osseous abnormality. IMPRESSION: No  active cardiopulmonary disease. Electronically Signed   By: Thornell SartoriusLaura  Taylor M.D.   On: 06/12/2021 20:53   CT Head Wo Contrast  Result Date: 06/12/2021 CLINICAL DATA:  Headache, hypertension, dizziness, and weakness. EXAM: CT HEAD WITHOUT CONTRAST TECHNIQUE: Contiguous axial images were obtained from the base of the skull through the vertex without intravenous contrast. COMPARISON:  None. FINDINGS: Brain: No acute intracranial hemorrhage, midline shift or mass effect. No extra-axial fluid collection. Gray-white matter differentiation is within normal limits and there is no hydrocephalus. There is a wedge-shaped hypodensity in the inferior aspect of the cerebellum on the right. Vascular: No hyperdense vessel or unexpected calcification. Skull: Normal. Negative for fracture or focal lesion. Sinuses/Orbits: Mild mucosal thickening is present in the right maxillary sinus, left sphenoid sinus, and bilateral ethmoid air cells. The orbits are within normal limits. Other: None. IMPRESSION: Wedge-shaped hypodensity in the inferior aspect of the cerebellum on the right, possible infarct of indeterminate age versus other etiology. MRI with contrast is recommended for further evaluation. Electronically Signed   By: Thornell SartoriusLaura  Taylor M.D.   On: 06/12/2021 20:52   MR Brain W and Wo Contrast  Result Date: 06/13/2021 CLINICAL DATA:  Dizziness, headache, weakness EXAM: MRI HEAD WITHOUT AND WITH CONTRAST TECHNIQUE: Multiplanar, multiecho pulse sequences of the brain and surrounding structures were obtained without and with intravenous contrast. CONTRAST:  9.607mL GADAVIST GADOBUTROL 1 MMOL/ML IV SOLN COMPARISON:  06/12/2021 CT head.  No prior MRI. FINDINGS: Brain: Several foci of restricted diffusion in the inferior right cerebellum, the largest of which measures up to 1.6 cm (series 4, image 12). These are associated with T2 hyperintense signal and contrast enhancement (series 11, images 6-10). An additional area of enhancement in the  superior posterior right cerebellum (series 11, image 14), is not associated with abnormal signal on diffusion-weighted imaging, which has likely normalized. No other abnormal enhancement. No acute hemorrhage, mass, mass effect, or midline shift. No hydrocephalus or extra-axial collection. No hemosiderin deposition to suggest remote hemorrhage. Vascular: Normal flow voids. Skull and upper cervical spine: Normal marrow signal. Sinuses/Orbits: Mucous retention cysts in the right maxillary sinus. Mild mucosal thickening in the anterior ethmoid air cells. Otherwise negative. Other: The mastoids are well aerated. IMPRESSION: 1. Acute infarcts in the inferior right cerebellum, in the PICA territory, which demonstrate mildly increased signal on diffusion-weighted imaging, contrast enhancement, and T2 hyperintensity. 2. Subacute infarct in the superior posterior right cerebellum, in the SCA territory, with normalization on diffusion-weighted imaging but contrast enhancement and T2 hyperintense signal. Given multiple posterior vascular territories and different ages of infarcts, consider a vertebral artery etiology; a CTA of the head and neck is recommended. These results were called by telephone at the time of interpretation on 06/13/2021 at 12:33 pm to provider DR. HORTON, who verbally acknowledged these results. Electronically Signed   By: Wiliam KeAlison  Vasan M.D.   On: 06/13/2021 12:34   VAS US TRANSCRANIAL DOPPLER W BUBBLES  Result Date:  06/14/2021  Transcranial Doppler with Bubble Patient Name:  BALJIT LIEBERT  Date of Exam:   06/14/2021 Medical Rec #: [id number]        Accession #:    [id number] Date of Birth: 1985/08/24        Patient Gender: F Patient Age:   62 years Exam Location:  [facility] Procedure:      VAS [place] TRANSCRANIAL DOPPLER W BUBBLES Referring Phys: Scheryl Marten XU --------------------------------------------------------------------------------  Indications: Stroke. History: Hypertension. Weakness,  dizziness, fatigue, vision changes. Limitations for diagnostic windows: Comparison Study: No prior study Performing Technologist: Sherren Kerns RVS  Examination Guidelines: A complete evaluation includes B-mode imaging, spectral Doppler, color Doppler, and power Doppler as needed of all accessible portions of each vessel. Bilateral testing is considered an integral part of a complete examination. Limited examinations for reoccurring indications may be performed as noted.  Summary: No HITS at rest or during Valsalva. Negative transcranial Doppler Bubble study with no evidence of right to left intracardiac communication.  *See table(s) above for TCD measurements and observations.    Preliminary    ECHOCARDIOGRAM COMPLETE BUBBLE STUDY  Result Date: 06/13/2021    ECHOCARDIOGRAM REPORT   Patient Name:   NANCYE GRUMBINE Date of Exam: 06/13/2021 Medical Rec #:  [id number]       Height:       66.0 in Accession #:    [id number]      Weight:       225.0 lb Date of Birth:  1986/01/20       BSA:          2.102 m Patient Age:    35 years        BP:           142/111 mmHg Patient Gender: F               HR:           93 bpm. Exam Location:  Inpatient Procedure: 2D Echo and Saline Contrast Bubble Study Indications:    stroke  History:        Patient has no prior history of Echocardiogram examinations.                 Risk Factors:Hypertension.  Sonographer:    Delcie Roch RDCS Referring Phys: [id number] ALLISON WOLFE IMPRESSIONS  1. Left ventricular ejection fraction, by estimation, is 60 to 65%. The left ventricle has normal function. The left ventricle has no regional wall motion abnormalities. Left ventricular diastolic parameters were normal.  2. Area of lateral apical RV hypokinesis . Right ventricular systolic function is mildly reduced. The right ventricular size is mildly enlarged.  3. The mitral valve is normal in structure. No evidence of mitral valve regurgitation. No evidence of mitral stenosis.  4. The aortic  valve is tricuspid. Aortic valve regurgitation is not visualized. No aortic stenosis is present.  5. The inferior vena cava is normal in size with greater than 50% respiratory variability, suggesting right atrial pressure of 3 mmHg.  6. Agitated saline contrast bubble study was negative, with no evidence of any interatrial shunt. FINDINGS  Left Ventricle: Left ventricular ejection fraction, by estimation, is 60 to 65%. The left ventricle has normal function. The left ventricle has no regional wall motion abnormalities. The left ventricular internal cavity size was normal in size. There is  no left ventricular hypertrophy. Left ventricular diastolic parameters were normal. Right Ventricle: Area of lateral apical RV hypokinesis. The right ventricular size is  mildly enlarged. No increase in right ventricular wall thickness. Right ventricular systolic function is mildly reduced. Left Atrium: Left atrial size was normal in size. Right Atrium: Right atrial size was normal in size. Pericardium: There is no evidence of pericardial effusion. Mitral Valve: The mitral valve is normal in structure. No evidence of mitral valve regurgitation. No evidence of mitral valve stenosis. Tricuspid Valve: The tricuspid valve is normal in structure. Tricuspid valve regurgitation is not demonstrated. No evidence of tricuspid stenosis. Aortic Valve: The aortic valve is tricuspid. Aortic valve regurgitation is not visualized. No aortic stenosis is present. Pulmonic Valve: The pulmonic valve was normal in structure. Pulmonic valve regurgitation is not visualized. No evidence of pulmonic stenosis. Aorta: The aortic root is normal in size and structure. Venous: The inferior vena cava is normal in size with greater than 50% respiratory variability, suggesting right atrial pressure of 3 mmHg. IAS/Shunts: No atrial level shunt detected by color flow Doppler. Agitated saline contrast was given intravenously to evaluate for intracardiac shunting.  Agitated saline contrast bubble study was negative, with no evidence of any interatrial shunt.  LEFT VENTRICLE PLAX 2D LVIDd:         4.20 cm   Diastology LVIDs:         2.50 cm   LV e' medial:    11.80 cm/s LV PW:         1.10 cm   LV E/e' medial:  6.3 LV IVS:        0.90 cm   LV e' lateral:   15.00 cm/s LVOT diam:     2.10 cm   LV E/e' lateral: 5.0 LV SV:         52 LV SV Index:   25 LVOT Area:     3.46 cm  RIGHT VENTRICLE             IVC RV Basal diam:  2.20 cm     IVC diam: 1.50 cm RV S prime:     14.00 cm/s TAPSE (M-mode): 1.6 cm LEFT ATRIUM             Index        RIGHT ATRIUM           Index LA diam:        3.00 cm 1.43 cm/m   RA Area:     12.70 cm LA Vol (A2C):   33.5 ml 15.93 ml/m  RA Volume:   30.90 ml  14.70 ml/m LA Vol (A4C):   24.9 ml 11.84 ml/m LA Biplane Vol: 30.0 ml 14.27 ml/m  AORTIC VALVE LVOT Vmax:   86.00 cm/s LVOT Vmean:  57.600 cm/s LVOT VTI:    0.150 m  AORTA Ao Root diam: 3.30 cm Ao Asc diam:  2.90 cm MITRAL VALVE MV Area (PHT): 3.99 cm    SHUNTS MV Decel Time: 190 msec    Systemic VTI:  0.15 m MV E velocity: 74.80 cm/s  Systemic Diam: 2.10 cm MV A velocity: 54.80 cm/s MV E/A ratio:  1.36 Charlton Haws MD Electronically signed by Charlton Haws MD Signature Date/Time: 06/13/2021/4:50:26 PM    Final     Subjective: Feels about the same, no current weakness focally. No chest pain or dyspnea working with PT and OT. Wants to go home.  Discharge Exam: Vitals:   06/14/21 1143 06/14/21 1653  BP: (!) 146/88 (!) 146/97  Pulse: 74 93  Resp: 18 18  Temp: 98.2 F (36.8 C)   SpO2: 100% 100%  Gen: Very pleasant female in no distress. Good historian. Pulm: Non-labored breathing room air. Clear to auscultation bilaterally.  CV: Regular rate and rhythm. No murmur, rub, or gallop. No JVD, no pitting pedal edema. GI: Abdomen soft, non-tender, non-distended, with normoactive bowel sounds. No organomegaly or masses felt. Ext: Warm, no deformities Skin: No rashes, lesions or ulcers on  visualized skin. Neuro: Alert and oriented. No focal neurological deficits. Psych: Judgement and insight appear normal. Mood & affect appropriate.   Labs: Basic Metabolic Panel: Recent Labs  Lab 06/12/21 1905 06/13/21 1458  NA 133* 132*  K 4.6 3.1*  CL 97* 101  CO2 25 18*  GLUCOSE 100* 91  BUN 12 9  CREATININE 0.73 0.53  CALCIUM 9.3 8.3*   Liver Function Tests: No results for input(s): AST, ALT, ALKPHOS, BILITOT, PROT, ALBUMIN in the last 168 hours. No results for input(s): LIPASE, AMYLASE in the last 168 hours. No results for input(s): AMMONIA in the last 168 hours. CBC: Recent Labs  Lab 06/12/21 1905 06/13/21 1458  WBC 17.8* 13.5*  NEUTROABS 12.2*  --   HGB 15.6* 15.4*  HCT 47.1* 47.4*  MCV 89.7 90.6  PLT 413* 295   Hgb A1c Recent Labs    06/14/21 0234  HGBA1C 5.0   Lipid Profile Recent Labs    06/13/21 1458  CHOL 251*  HDL 41  LDLCALC 181*  TRIG 146  CHOLHDL 6.1   Thyroid function studies Recent Labs    06/13/21 1458  TSH 1.918   Anemia work up Recent Labs    06/13/21 1458  VITAMINB12 272   Urinalysis    Component Value Date/Time   COLORURINE STRAW (A) 06/12/2021 2043   APPEARANCEUR CLEAR 06/12/2021 2043   LABSPEC 1.006 06/12/2021 2043   PHURINE 6.0 06/12/2021 2043   GLUCOSEU NEGATIVE 06/12/2021 2043   HGBUR SMALL (A) 06/12/2021 2043   HGBUR negative 06/05/2010 0931   BILIRUBINUR NEGATIVE 06/12/2021 2043   BILIRUBINUR n 01/06/2012 1256   KETONESUR NEGATIVE 06/12/2021 2043   PROTEINUR NEGATIVE 06/12/2021 2043   UROBILINOGEN 1.0 01/06/2012 1256   UROBILINOGEN 0.2 06/05/2010 0931   NITRITE NEGATIVE 06/12/2021 2043   LEUKOCYTESUR NEGATIVE 06/12/2021 2043    Microbiology Recent Results (from the past 240 hour(s))  Resp Panel by RT-PCR (Flu A&B, Covid) Nasopharyngeal Swab     Status: None   Collection Time: 06/13/21  1:19 PM   Specimen: Nasopharyngeal Swab; Nasopharyngeal(NP) swabs in vial transport medium  Result Value Ref Range  Status   SARS Coronavirus 2 by RT PCR NEGATIVE NEGATIVE Final    Comment: (NOTE) SARS-CoV-2 target nucleic acids are NOT DETECTED.  The SARS-CoV-2 RNA is generally detectable in upper respiratory specimens during the acute phase of infection. The lowest concentration of SARS-CoV-2 viral copies this assay can detect is 138 copies/mL. A negative result does not preclude SARS-Cov-2 infection and should not be used as the sole basis for treatment or other patient management decisions. A negative result may occur with  improper specimen collection/handling, submission of specimen other than nasopharyngeal swab, presence of viral mutation(s) within the areas targeted by this assay, and inadequate number of viral copies(<138 copies/mL). A negative result must be combined with clinical observations, patient history, and epidemiological information. The expected result is Negative.  Fact Sheet for Patients:  BloggerCourse.com  Fact Sheet for Healthcare Providers:  SeriousBroker.it  This test is no t yet approved or cleared by the [place] FDA and  has been authorized for detection and/or diagnosis of SARS-CoV-2  by FDA under an Emergency Use Authorization (EUA). This EUA will remain  in effect (meaning this test can be used) for the duration of the COVID-19 declaration under Section 564(b)(1) of the Act, 21 U.S.C.section 360bbb-3(b)(1), unless the authorization is terminated  or revoked sooner.       Influenza A by PCR NEGATIVE NEGATIVE Final   Influenza B by PCR NEGATIVE NEGATIVE Final    Comment: (NOTE) The Xpert Xpress SARS-CoV-2/FLU/RSV plus assay is intended as an aid in the diagnosis of influenza from Nasopharyngeal swab specimens and should not be used as a sole basis for treatment. Nasal washings and aspirates are unacceptable for Xpert Xpress SARS-CoV-2/FLU/RSV testing.  Fact Sheet for  Patients: BloggerCourse.com  Fact Sheet for Healthcare Providers: SeriousBroker.it  This test is not yet approved or cleared by the [place] FDA and has been authorized for detection and/or diagnosis of SARS-CoV-2 by FDA under an Emergency Use Authorization (EUA). This EUA will remain in effect (meaning this test can be used) for the duration of the COVID-19 declaration under Section 564(b)(1) of the Act, 21 U.S.C. section 360bbb-3(b)(1), unless the authorization is terminated or revoked.  Performed at [facility], [address], [place], [place] [address]     Time coordinating discharge: Approximately 40 minutes  Tyrone Nine, MD  [facility] 06/14/2021, 5:29 PM

## 2021-06-14 NOTE — Progress Notes (Signed)
PROGRESS NOTE  Tonya [name]  [id number] DOB: Apr 10, 1986 DOA: 06/12/2021 PCP: Lanelle Bal, PA-C   Brief Narrative: Tonya [name] is a 36 y.o. female with a history of obesity, migraines, vaping, recently started on HCTZ, losartan for elevated BP who presented to the ED 1/5 with fatigue, heaviness in the limbs, dizziness and intermittent vision changes. She woke up 12/30 with severe dizziness in bed, got up with unsteady gait and later had a stabbing right sided headache improved with ibuprofen. Later she had floaters and trouble focusing on computer screen and has since had intermittent dizziness, proprioceptive disturbances ("floating in slow motion"), and intermittent blurry vision. Also recently diagnosed with high blood pressure, started on norvasc which was ineffective and changed to HCTZ 50mg  and losartan 100mg . Due to continued progressive heaviness in extremities and increasing fatigue, she presented to the ED on 1/5 where she was afebrile, hypertensive to 176/112 with WBC 17.8k. CT head showed wedge shaped hypodensity in the inferior aspect of the cerebellum on right. Subsequent MRI revealed acute infarcts in the inferior right cerebellum, PICA territory. Subacute infarct in superior posterior right cerebellum, SCA territory. No LVO, stenosis or dissection on CTA. Neurology was consulted and the patient was admitted to [facility]. Echocardiogram revealed LVEF [id number], no cardioembolic source. LDL noted to be 181 for which statin therapy was started in addition to antiplatelet therapy guided by stroke neurology. Hypercoagulability work up is pending.   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 Negative echo/bubble study.  - Transcranial doppler per neuro - LE venous U/S  pending - Hypercoagulability labs pending. No personal/FH of DVTs or stroke (grandfather on eliquis for AFib) - DAPT - No PT or OT follow up recommended.  Fatigue: Likely related to infarcts. B12, TSH, UA, UDS, HIV, RPR negative.  HTN: Could be exacerbated due to infarcts - Restart losartan 100mg  - Restart HCTZ at 25mg  dose - Follow up with PCP  Area of lateral apical RV hypokinesis: Noted on echocardiogram with no LV wall abnormalities. IVC small and collapsible. Negative troponin. ECG is unremarkable. Discussed with cardiology, Dr. Stanford Breed.  - Consider repeat echocardiogram in the future. No further evaluation or treatment is required at this time.   Hyperlipidemia: LDL 181.  - Continue high-intensity statin started this admission. May need additional agent to get < 70, though lifestyle modification will also help.  Obesity: Estimated body mass index is 36.32 kg/m as calculated from the following:   Height as of this encounter: 5\' 6"  (1.676 m).   Weight as of this encounter: 102.1 kg. - Patient intends to enact significant lifestyle modifications to improve this risk factor. PCP follow up, outpatient dietitian recommended. Consider semaglutide.   Leukocytosis: Suspected to be reactive in afebrile pt here with stroke, trending downward without antimicrobial Tx.   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 Has not been chronic (though has been noted previously). ?if secondary to smoking/vaping.  - Recheck CBC at follow up and consider further work up if persistent.  Hypokalemia: Supplemented  Hyponatremia: Mild. Monitor at follow up, anticipate improvement with decreased dose of HCTZ.   Constipation:  - Stool regimen ordered  Lumbar DDD s/p back surgery 2012, laminectomy, microdiscectomy Nov 2020, and lumbar stenosis s/p fusion Dec 2021  History of gestational diabetes: No longer needing Tx. HbA1c 5%.  DVT prophylaxis: Lovenox Code Status: Full Family  Communication: None at bedside Disposition Plan:  Status is: Inpatient pending neurology clearance  Consultants:  Stroke neurology Cardiology  Subjective: Feels about the same, no current weakness focally. No chest pain or dyspnea working with PT and OT.  Objective: Vitals:   06/13/21 2321 06/14/21 0325 06/14/21 0732 06/14/21 1143  BP: (!) 151/80 124/87 (!) 143/97 (!) 146/88  Pulse: 98 77 86 74  Resp: 18 15 18 18   Temp: 98.4 F (36.9 C) 98.3 F (36.8 C) 98.3 F (36.8 C) 98.2 F (36.8 C)  TempSrc: Oral Oral Oral Oral  SpO2: 99% 98% 98% 100%  Weight:      Height:       Filed Weights   06/13/21 1340  Weight: 102.1 kg   Gen: Very pleasant female in no distress. Good historian. Pulm: Non-labored breathing room air. Clear to auscultation bilaterally.  CV: Regular rate and rhythm. No murmur, rub, or gallop. No JVD, no pitting pedal edema. GI: Abdomen soft, non-tender, non-distended, with normoactive bowel sounds. No organomegaly or masses felt. Ext: Warm, no deformities Skin: No rashes, lesions or ulcers on visualized skin. Neuro: Alert and oriented. No focal neurological deficits. Psych: Judgement and insight appear normal. Mood & affect appropriate.   Data Reviewed: I have personally reviewed following labs and imaging studies  CBC: Recent Labs  Lab 06/12/21 1905 06/13/21 1458  WBC 17.8* 13.5*  NEUTROABS 12.2*  --   HGB 15.6* 15.4*  HCT 47.1* 47.4*  MCV 89.7 90.6  PLT 413* [id number]   Basic Metabolic Panel: Recent Labs  Lab 06/12/21 1905 06/13/21 1458  NA 133* 132*  K 4.6 3.1*  CL 97* 101  CO2 25 18*  GLUCOSE 100* 91  BUN 12 9  CREATININE 0.73 0.53  CALCIUM 9.3 8.3*   HbA1C: Recent Labs    06/14/21 0234  HGBA1C 5.0   Lipid Profile: Recent Labs    06/13/21 1458  CHOL 251*  HDL 41  LDLCALC 181*  TRIG 146  CHOLHDL 6.1   Thyroid Function Tests: Recent Labs    06/13/21 1458  TSH 1.918   Anemia Panel: Recent Labs    06/13/21 1458   VITAMINB12 272   Urine analysis:    Component Value Date/Time   COLORURINE STRAW (A) 06/12/2021 2043   APPEARANCEUR CLEAR 06/12/2021 2043   LABSPEC 1.006 06/12/2021 2043   PHURINE 6.0 06/12/2021 2043   GLUCOSEU NEGATIVE 06/12/2021 2043   HGBUR SMALL (A) 06/12/2021 2043   HGBUR negative 06/05/2010 0931   BILIRUBINUR NEGATIVE 06/12/2021 2043   BILIRUBINUR n 01/06/2012 1256   KETONESUR NEGATIVE 06/12/2021 2043   PROTEINUR NEGATIVE 06/12/2021 2043   UROBILINOGEN 1.0 01/06/2012 1256   UROBILINOGEN 0.2 06/05/2010 0931   NITRITE NEGATIVE 06/12/2021 2043   LEUKOCYTESUR NEGATIVE 06/12/2021 2043   Recent Results (from the past 240 hour(s))  Resp Panel by RT-PCR (Flu A&B, Covid) Nasopharyngeal Swab     Status: None   Collection Time: 06/13/21  1:19 PM   Specimen: Nasopharyngeal Swab; Nasopharyngeal(NP) swabs in vial transport medium  Result Value Ref Range Status   SARS Coronavirus 2 by RT PCR NEGATIVE NEGATIVE Final    Comment: (NOTE) SARS-CoV-2 target nucleic acids are NOT DETECTED.  The SARS-CoV-2 RNA is generally detectable in upper respiratory specimens during the acute phase of infection. The lowest concentration of SARS-CoV-2 viral copies this assay can detect is 138 copies/mL. A negative result does not preclude SARS-Cov-2 infection and should not be used as  the sole basis for treatment or other patient management decisions. A negative result may occur with  improper specimen collection/handling, submission of specimen other than nasopharyngeal swab, presence of viral mutation(s) within the areas targeted by this assay, and inadequate number of viral copies(<138 copies/mL). A negative result must be combined with clinical observations, patient history, and epidemiological information. The expected result is Negative.  Fact Sheet for Patients:  EntrepreneurPulse.com.au  Fact Sheet for Healthcare Providers:   IncredibleEmployment.be  This test is no t yet approved or cleared by the [place] FDA and  has been authorized for detection and/or diagnosis of SARS-CoV-2 by FDA under an Emergency Use Authorization (EUA). This EUA will remain  in effect (meaning this test can be used) for the duration of the COVID-19 declaration under Section 564(b)(1) of the Act, 21 U.S.C.section 360bbb-3(b)(1), unless the authorization is terminated  or revoked sooner.       Influenza A by PCR NEGATIVE NEGATIVE Final   Influenza B by PCR NEGATIVE NEGATIVE Final    Comment: (NOTE) The Xpert Xpress SARS-CoV-2/FLU/RSV plus assay is intended as an aid in the diagnosis of influenza from Nasopharyngeal swab specimens and should not be used as a sole basis for treatment. Nasal washings and aspirates are unacceptable for Xpert Xpress SARS-CoV-2/FLU/RSV testing.  Fact Sheet for Patients: EntrepreneurPulse.com.au  Fact Sheet for Healthcare Providers: IncredibleEmployment.be  This test is not yet approved or cleared by the [place] FDA and has been authorized for detection and/or diagnosis of SARS-CoV-2 by FDA under an Emergency Use Authorization (EUA). This EUA will remain in effect (meaning this test can be used) for the duration of the COVID-19 declaration under Section 564(b)(1) of the Act, 21 U.S.C. section 360bbb-3(b)(1), unless the authorization is terminated or revoked.  Performed at [facility], [place] [address], [place], [place] [address]       Radiology Studies: CT ANGIO HEAD NECK W WO CM  Result Date: 06/13/2021 CLINICAL DATA:  Stroke follow-up EXAM: CT ANGIOGRAPHY HEAD AND NECK TECHNIQUE: Multidetector CT imaging of the head and neck was performed using the standard protocol during bolus administration of intravenous contrast. Multiplanar CT image reconstructions and MIPs were obtained to evaluate the vascular anatomy. Carotid  stenosis measurements (when applicable) are obtained utilizing NASCET criteria, using the distal internal carotid diameter as the denominator. CONTRAST:  67mL OMNIPAQUE IOHEXOL 350 MG/ML SOLN COMPARISON:  CT head 06/12/2021, MRI head 06/13/2021 FINDINGS: CT HEAD FINDINGS Brain: Redemonstrated areas of hypodensity in the right cerebellum, which correlate with acute and subacute infarcts on same-day MRI. No new area of hypodensity. No acute hemorrhage, mass, mass effect, or midline shift. No significant mass effect on the fourth ventricle. No hydrocephalus or extra-axial collection. Vascular: No hyperdense vessel. Skull: Normal. Negative for fracture or focal lesion. Sinuses: Mucous retention cysts in the right maxillary sinus. Mild mucosal thickening in the ethmoid air cells. Orbits: Negative. CTA NECK FINDINGS Aortic arch: Standard branching. Imaged portion shows no evidence of aneurysm or dissection. No significant stenosis of the major arch vessel origins. Right carotid system: No evidence of dissection, stenosis (50% or greater) or occlusion. Left carotid system: No evidence of dissection, stenosis (50% or greater) or occlusion. Vertebral arteries: Diminutive right vertebral artery, from its origin to the vertebrobasilar junction. Normal right vertebral artery. No evidence of dissection, stenosis, or occlusion. Skeleton: No acute osseous abnormality. Other neck: Negative. Upper chest: Negative. Review of the MIP images confirms the above findings CTA HEAD FINDINGS Anterior circulation: Both internal carotid  arteries are patent to the termini, without stenosis or other abnormality. A1 segments patent. Normal anterior communicating artery. Anterior cerebral arteries are patent to their distal aspects. No M1 stenosis or occlusion. Normal MCA bifurcations. Distal MCA branches perfused and symmetric. Posterior circulation: Vertebral arteries patent to the vertebrobasilar junction without stenosis. Posterior inferior  cerebral arteries patent bilaterally. Basilar patent to its distal aspect. Superior cerebellar arteries patent bilaterally. Bilateral P1 segments originate from the basilar artery. PCAs perfused to their distal aspects without stenosis. The posterior communicating arteries are not visualized. Venous sinuses: As permitted by contrast timing, patent. Anatomic variants: None significant Review of the MIP images confirms the above findings IMPRESSION: 1. No evidence of dissection. 2.  No intracranial large vessel occlusion or significant stenosis. 3.  No hemodynamically significant stenosis in the neck. 4. Redemonstrated hypodensity in the right cerebellar hemisphere, consistent with the acute and subacute infarcts seen on the same-day MRI. Electronically Signed   By: Merilyn Baba M.D.   On: 06/13/2021 13:38   DG Chest 2 View  Result Date: 06/12/2021 CLINICAL DATA:  Chest pain, hypertension. EXAM: CHEST - 2 VIEW COMPARISON:  03/31/2007. FINDINGS: The heart size and mediastinal contours are within normal limits. Both lungs are clear. No acute osseous abnormality. IMPRESSION: No active cardiopulmonary disease. Electronically Signed   By: Brett Fairy M.D.   On: 06/12/2021 20:53   CT Head Wo Contrast  Result Date: 06/12/2021 CLINICAL DATA:  Headache, hypertension, dizziness, and weakness. EXAM: CT HEAD WITHOUT CONTRAST TECHNIQUE: Contiguous axial images were obtained from the base of the skull through the vertex without intravenous contrast. COMPARISON:  None. FINDINGS: Brain: No acute intracranial hemorrhage, midline shift or mass effect. No extra-axial fluid collection. Gray-white matter differentiation is within normal limits and there is no hydrocephalus. There is a wedge-shaped hypodensity in the inferior aspect of the cerebellum on the right. Vascular: No hyperdense vessel or unexpected calcification. Skull: Normal. Negative for fracture or focal lesion. Sinuses/Orbits: Mild mucosal thickening is present in  the right maxillary sinus, left sphenoid sinus, and bilateral ethmoid air cells. The orbits are within normal limits. Other: None. IMPRESSION: Wedge-shaped hypodensity in the inferior aspect of the cerebellum on the right, possible infarct of indeterminate age versus other etiology. MRI with contrast is recommended for further evaluation. Electronically Signed   By: Brett Fairy M.D.   On: 06/12/2021 20:52   MR Brain W and Wo Contrast  Result Date: 06/13/2021 CLINICAL DATA:  Dizziness, headache, weakness EXAM: MRI HEAD WITHOUT AND WITH CONTRAST TECHNIQUE: Multiplanar, multiecho pulse sequences of the brain and surrounding structures were obtained without and with intravenous contrast. CONTRAST:  9.29mL GADAVIST GADOBUTROL 1 MMOL/ML IV SOLN COMPARISON:  06/12/2021 CT head.  No prior MRI. FINDINGS: Brain: Several foci of restricted diffusion in the inferior right cerebellum, the largest of which measures up to 1.6 cm (series 4, image 12). These are associated with T2 hyperintense signal and contrast enhancement (series 11, images 6-10). An additional area of enhancement in the superior posterior right cerebellum (series 11, image 14), is not associated with abnormal signal on diffusion-weighted imaging, which has likely normalized. No other abnormal enhancement. No acute hemorrhage, mass, mass effect, or midline shift. No hydrocephalus or extra-axial collection. No hemosiderin deposition to suggest remote hemorrhage. Vascular: Normal flow voids. Skull and upper cervical spine: Normal marrow signal. Sinuses/Orbits: Mucous retention cysts in the right maxillary sinus. Mild mucosal thickening in the anterior ethmoid air cells. Otherwise negative. Other: The mastoids are well aerated. IMPRESSION: 1.  Acute infarcts in the inferior right cerebellum, in the PICA territory, which demonstrate mildly increased signal on diffusion-weighted imaging, contrast enhancement, and T2 hyperintensity. 2. Subacute infarct in the  superior posterior right cerebellum, in the SCA territory, with normalization on diffusion-weighted imaging but contrast enhancement and T2 hyperintense signal. Given multiple posterior vascular territories and different ages of infarcts, consider a vertebral artery etiology; a CTA of the head and neck is recommended. These results were called by telephone at the time of interpretation on 06/13/2021 at 12:33 pm to provider DR. HORTON, who verbally acknowledged these results. Electronically Signed   By: Merilyn Baba M.D.   On: 06/13/2021 12:34   ECHOCARDIOGRAM COMPLETE BUBBLE STUDY  Result Date: 06/13/2021    ECHOCARDIOGRAM REPORT   Patient Name:   ANNABELLE ALLCORN Date of Exam: 06/13/2021 Medical Rec #:  [id number]       Height:       66.0 in Accession #:    [id number]      Weight:       225.0 lb Date of Birth:  April 01, 1986       BSA:          2.102 m Patient Age:    35 years        BP:           142/111 mmHg Patient Gender: F               HR:           93 bpm. Exam Location:  Inpatient Procedure: 2D Echo and Saline Contrast Bubble Study Indications:    stroke  History:        Patient has no prior history of Echocardiogram examinations.                 Risk Factors:Hypertension.  Sonographer:    Johny Chess RDCS Referring Phys: [id number] [place]  1. Left ventricular ejection fraction, by estimation, is 60 to 65%. The left ventricle has normal function. The left ventricle has no regional wall motion abnormalities. Left ventricular diastolic parameters were normal.  2. Area of lateral apical RV hypokinesis . Right ventricular systolic function is mildly reduced. The right ventricular size is mildly enlarged.  3. The mitral valve is normal in structure. No evidence of mitral valve regurgitation. No evidence of mitral stenosis.  4. The aortic valve is tricuspid. Aortic valve regurgitation is not visualized. No aortic stenosis is present.  5. The inferior vena cava is normal in size with greater than  50% respiratory variability, suggesting right atrial pressure of 3 mmHg.  6. Agitated saline contrast bubble study was negative, with no evidence of any interatrial shunt. FINDINGS  Left Ventricle: Left ventricular ejection fraction, by estimation, is 60 to 65%. The left ventricle has normal function. The left ventricle has no regional wall motion abnormalities. The left ventricular internal cavity size was normal in size. There is  no left ventricular hypertrophy. Left ventricular diastolic parameters were normal. Right Ventricle: Area of lateral apical RV hypokinesis. The right ventricular size is mildly enlarged. No increase in right ventricular wall thickness. Right ventricular systolic function is mildly reduced. Left Atrium: Left atrial size was normal in size. Right Atrium: Right atrial size was normal in size. Pericardium: There is no evidence of pericardial effusion. Mitral Valve: The mitral valve is normal in structure. No evidence of mitral valve regurgitation. No evidence of mitral valve stenosis. Tricuspid Valve: The tricuspid valve is normal in structure. Tricuspid valve  regurgitation is not demonstrated. No evidence of tricuspid stenosis. Aortic Valve: The aortic valve is tricuspid. Aortic valve regurgitation is not visualized. No aortic stenosis is present. Pulmonic Valve: The pulmonic valve was normal in structure. Pulmonic valve regurgitation is not visualized. No evidence of pulmonic stenosis. Aorta: The aortic root is normal in size and structure. Venous: The inferior vena cava is normal in size with greater than 50% respiratory variability, suggesting right atrial pressure of 3 mmHg. IAS/Shunts: No atrial level shunt detected by color flow Doppler. Agitated saline contrast was given intravenously to evaluate for intracardiac shunting. Agitated saline contrast bubble study was negative, with no evidence of any interatrial shunt.  LEFT VENTRICLE PLAX 2D LVIDd:         4.20 cm   Diastology LVIDs:          2.50 cm   LV e' medial:    11.80 cm/s LV PW:         1.10 cm   LV E/e' medial:  6.3 LV IVS:        0.90 cm   LV e' lateral:   15.00 cm/s LVOT diam:     2.10 cm   LV E/e' lateral: 5.0 LV SV:         52 LV SV Index:   25 LVOT Area:     3.46 cm  RIGHT VENTRICLE             IVC RV Basal diam:  2.20 cm     IVC diam: 1.50 cm RV S prime:     14.00 cm/s TAPSE (M-mode): 1.6 cm LEFT ATRIUM             Index        RIGHT ATRIUM           Index LA diam:        3.00 cm 1.43 cm/m   RA Area:     12.70 cm LA Vol (A2C):   33.5 ml 15.93 ml/m  RA Volume:   30.90 ml  14.70 ml/m LA Vol (A4C):   24.9 ml 11.84 ml/m LA Biplane Vol: 30.0 ml 14.27 ml/m  AORTIC VALVE LVOT Vmax:   86.00 cm/s LVOT Vmean:  57.600 cm/s LVOT VTI:    0.150 m  AORTA Ao Root diam: 3.30 cm Ao Asc diam:  2.90 cm MITRAL VALVE MV Area (PHT): 3.99 cm    SHUNTS MV Decel Time: 190 msec    Systemic VTI:  0.15 m MV E velocity: 74.80 cm/s  Systemic Diam: 2.10 cm MV A velocity: 54.80 cm/s MV E/A ratio:  1.36 Jenkins Rouge MD Electronically signed by Jenkins Rouge MD Signature Date/Time: 06/13/2021/4:50:26 PM    Final     Scheduled Meds:   stroke: mapping our early stages of recovery book   Does not apply Once   aspirin EC  81 mg Oral Daily   atorvastatin  80 mg Oral Daily   clopidogrel  75 mg Oral Daily   enoxaparin (LOVENOX) injection  40 mg Subcutaneous Q24H   polyethylene glycol  17 g Oral Daily   senna-docusate  1 tablet Oral BID   Continuous Infusions:   LOS: 0 days   Patrecia Pour, MD [facility] [email or web address] 06/14/2021, 3:41 PM

## 2021-06-14 NOTE — Evaluation (Signed)
Occupational Therapy Evaluation Patient Details Name: Tonya [name] MRN: [id number] DOB: 02/10/1986 Today's Date: 06/14/2021   History of Present Illness Pt is a 36 y.o. female who presented to ED 1/5 with fatigue, heaviness in limbs, headache, intermittent vision changes, and dizziness.  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 PMH: HTN , lumbar stenosis s/p fusion in 05/2020   Clinical Impression   Pt admitted for concerns listed above. PTA pt reported that she was independent with all ADL's and IADL's, including rasing 2 young children, working as an Airline pilot, and driving. At this time, pt appears back at her baseline, able to complete all ADL's and functional mobility independently. Vision and Cognition appear [facility] and pt reports they seem back to normal as well. She has no further OT needs at this time and acute OT will sign off.      Recommendations for follow up therapy are one component of a multi-disciplinary discharge planning process, led by the attending physician.  Recommendations may be updated based on patient status, additional functional criteria and insurance authorization.   Follow Up Recommendations  No OT follow up    Assistance Recommended at Discharge None  Patient can return home with the following      Functional Status Assessment  Patient has had a recent decline in their functional status and demonstrates the ability to make significant improvements in function in a reasonable and predictable amount of time.  Equipment Recommendations  None recommended by OT    Recommendations for Other Services       Precautions / Restrictions Precautions Precautions: None Restrictions Weight Bearing Restrictions: No      Mobility Bed Mobility Overal bed mobility: Independent                  Transfers Overall transfer level: Independent Equipment used: None                       Balance Overall balance assessment: Modified Independent                               Standardized Balance Assessment Standardized Balance Assessment : Dynamic Gait Index   Dynamic Gait Index Level Surface: Normal Change in Gait Speed: Mild Impairment Gait with Horizontal Head Turns: Normal Gait with Vertical Head Turns: Normal Gait and Pivot Turn: Normal Step Over Obstacle: Normal Step Around Obstacles: Normal Steps: Normal Total Score: 23     ADL either performed or assessed with clinical judgement   ADL Overall ADL's : At baseline;Independent                                       General ADL Comments: Back no baseline, no difficulties     Vision Baseline Vision/History: 1 Wears glasses Ability to See in Adequate Light: 0 Adequate Patient Visual Report: No change from baseline Vision Assessment?: No apparent visual deficits     Perception     Praxis      Pertinent Vitals/Pain Pain Assessment: No/denies pain     Hand Dominance Right   Extremity/Trunk Assessment Upper Extremity Assessment Upper Extremity Assessment: Overall WFL for tasks assessed   Lower Extremity Assessment Lower Extremity Assessment: Defer to PT evaluation   Cervical / Trunk Assessment Cervical / Trunk Assessment:  Normal   Communication Communication Communication: No difficulties   Cognition Arousal/Alertness: Awake/alert Behavior During Therapy: WFL for tasks assessed/performed Overall Cognitive Status: Within Functional Limits for tasks assessed                                       General Comments  VSS on RA, educated on signs/symptoms of strokes    Exercises     Shoulder Instructions      Home Living Family/patient expects to be discharged to:: Private residence Living Arrangements: Spouse/significant other;Children Available Help at Discharge: Family Type of Home: House Home Access: Stairs to  enter Secretary/administrator of Steps: 4 Entrance Stairs-Rails: Right;Left Home Layout: One level     Bathroom Shower/Tub: Producer, television/film/video: Standard     Home Equipment: Hand held shower head;Shower seat - built in;Cane - single point          Prior Functioning/Environment Prior Level of Function : Independent/Modified Independent;Driving;Working/employed                        OT Problem List: Decreased activity tolerance;Impaired balance (sitting and/or standing)      OT Treatment/Interventions:      OT Goals(Current goals can be found in the care plan section) Acute Rehab OT Goals Patient Stated Goal: To find out why this happened OT Goal Formulation: All assessment and education complete, DC therapy Time For Goal Achievement: 06/14/21 Potential to Achieve Goals: Good  OT Frequency:      Co-evaluation              AM-PAC OT "6 Clicks" Daily Activity     Outcome Measure Help from another person eating meals?: None Help from another person taking care of personal grooming?: None Help from another person toileting, which includes using toliet, bedpan, or urinal?: None Help from another person bathing (including washing, rinsing, drying)?: None Help from another person to put on and taking off regular upper body clothing?: None Help from another person to put on and taking off regular lower body clothing?: None 6 Click Score: 24   End of Session Nurse Communication: Mobility status  Activity Tolerance: Patient tolerated treatment well Patient left: in bed;with call bell/phone within reach  OT Visit Diagnosis: Muscle weakness (generalized) (M62.81);Other abnormalities of gait and mobility (R26.89)                Time: 9379-0240 OT Time Calculation (min): 27 min Charges:  OT General Charges $OT Visit: 1 Visit OT Evaluation $OT Eval Moderate Complexity: 1 Mod OT Treatments $Therapeutic Activity: 8-22 mins  Hayzlee Mcsorley H., OTR/L Acute  Rehabilitation  Dameion Briles Elane Brelee Renk 06/14/2021, 11:31 AM

## 2021-06-14 NOTE — Progress Notes (Addendum)
STROKE TEAM PROGRESS NOTE   INTERVAL HISTORY Her mom is at the bedside.  Pt lying in bed. Stated that her symptoms all resolved now. She denies any heart palpitation, hx of stroke, no OCP use but had hysterectomy. She does have migraine and family Hx of migraine and strokes in grandparents.   OBJECTIVE Vitals:   06/13/21 1930 06/13/21 2030 06/13/21 2321 06/14/21 0325  BP: 140/81 140/86 (!) 151/80 124/87  Pulse: 80 80 98 77  Resp: 15 20 18 15   Temp:  98.4 F (36.9 C) 98.4 F (36.9 C) 98.3 F (36.8 C)  TempSrc:  Oral Oral Oral  SpO2: 97% 98% 99% 98%  Weight:      Height:        CBC:  Recent Labs  Lab 06/12/21 1905 06/13/21 1458  WBC 17.8* 13.5*  NEUTROABS 12.2*  --   HGB 15.6* 15.4*  HCT 47.1* 47.4*  MCV 89.7 90.6  PLT 413* 295    Basic Metabolic Panel:  Recent Labs  Lab 06/12/21 1905 06/13/21 1458  NA 133* 132*  K 4.6 3.1*  CL 97* 101  CO2 25 18*  GLUCOSE 100* 91  BUN 12 9  CREATININE 0.73 0.53  CALCIUM 9.3 8.3*    Lipid Panel:     Component Value Date/Time   CHOL 251 (H) 06/13/2021 1458   TRIG 146 06/13/2021 1458   HDL 41 06/13/2021 1458   CHOLHDL 6.1 06/13/2021 1458   VLDL 29 06/13/2021 1458   LDLCALC 181 (H) 06/13/2021 1458   HgbA1c:  Lab Results  Component Value Date   HGBA1C 5.0 06/14/2021   Urine Drug Screen:     Component Value Date/Time   LABOPIA NONE DETECTED 06/13/2021 1400   COCAINSCRNUR NONE DETECTED 06/13/2021 1400   LABBENZ NONE DETECTED 06/13/2021 1400   AMPHETMU NONE DETECTED 06/13/2021 1400   THCU NONE DETECTED 06/13/2021 1400   LABBARB NONE DETECTED 06/13/2021 1400    Alcohol Level No results found for: ETH  IMAGING   CT ANGIO HEAD NECK W WO CM  Result Date: 06/13/2021 CLINICAL DATA:  Stroke follow-up EXAM: CT ANGIOGRAPHY HEAD AND NECK TECHNIQUE: Multidetector CT imaging of the head and neck was performed using the standard protocol during bolus administration of intravenous contrast. Multiplanar CT image reconstructions  and MIPs were obtained to evaluate the vascular anatomy. Carotid stenosis measurements (when applicable) are obtained utilizing NASCET criteria, using the distal internal carotid diameter as the denominator. CONTRAST:  50mL OMNIPAQUE IOHEXOL 350 MG/ML SOLN COMPARISON:  CT head 06/12/2021, MRI head 06/13/2021 FINDINGS: CT HEAD FINDINGS Brain: Redemonstrated areas of hypodensity in the right cerebellum, which correlate with acute and subacute infarcts on same-day MRI. No new area of hypodensity. No acute hemorrhage, mass, mass effect, or midline shift. No significant mass effect on the fourth ventricle. No hydrocephalus or extra-axial collection. Vascular: No hyperdense vessel. Skull: Normal. Negative for fracture or focal lesion. Sinuses: Mucous retention cysts in the right maxillary sinus. Mild mucosal thickening in the ethmoid air cells. Orbits: Negative. CTA NECK FINDINGS Aortic arch: Standard branching. Imaged portion shows no evidence of aneurysm or dissection. No significant stenosis of the major arch vessel origins. Right carotid system: No evidence of dissection, stenosis (50% or greater) or occlusion. Left carotid system: No evidence of dissection, stenosis (50% or greater) or occlusion. Vertebral arteries: Diminutive right vertebral artery, from its origin to the vertebrobasilar junction. Normal right vertebral artery. No evidence of dissection, stenosis, or occlusion. Skeleton: No acute osseous abnormality. Other neck: Negative.  Upper chest: Negative. Review of the MIP images confirms the above findings CTA HEAD FINDINGS Anterior circulation: Both internal carotid arteries are patent to the termini, without stenosis or other abnormality. A1 segments patent. Normal anterior communicating artery. Anterior cerebral arteries are patent to their distal aspects. No M1 stenosis or occlusion. Normal MCA bifurcations. Distal MCA branches perfused and symmetric. Posterior circulation: Vertebral arteries patent to  the vertebrobasilar junction without stenosis. Posterior inferior cerebral arteries patent bilaterally. Basilar patent to its distal aspect. Superior cerebellar arteries patent bilaterally. Bilateral P1 segments originate from the basilar artery. PCAs perfused to their distal aspects without stenosis. The posterior communicating arteries are not visualized. Venous sinuses: As permitted by contrast timing, patent. Anatomic variants: None significant Review of the MIP images confirms the above findings IMPRESSION: 1. No evidence of dissection. 2.  No intracranial large vessel occlusion or significant stenosis. 3.  No hemodynamically significant stenosis in the neck. 4. Redemonstrated hypodensity in the right cerebellar hemisphere, consistent with the acute and subacute infarcts seen on the same-day MRI. Electronically Signed   By: Wiliam KeAlison  Vasan M.D.   On: 06/13/2021 13:38   DG Chest 2 View  Result Date: 06/12/2021 CLINICAL DATA:  Chest pain, hypertension. EXAM: CHEST - 2 VIEW COMPARISON:  03/31/2007. FINDINGS: The heart size and mediastinal contours are within normal limits. Both lungs are clear. No acute osseous abnormality. IMPRESSION: No active cardiopulmonary disease. Electronically Signed   By: Thornell SartoriusLaura  Taylor M.D.   On: 06/12/2021 20:53   CT Head Wo Contrast  Result Date: 06/12/2021 CLINICAL DATA:  Headache, hypertension, dizziness, and weakness. EXAM: CT HEAD WITHOUT CONTRAST TECHNIQUE: Contiguous axial images were obtained from the base of the skull through the vertex without intravenous contrast. COMPARISON:  None. FINDINGS: Brain: No acute intracranial hemorrhage, midline shift or mass effect. No extra-axial fluid collection. Gray-white matter differentiation is within normal limits and there is no hydrocephalus. There is a wedge-shaped hypodensity in the inferior aspect of the cerebellum on the right. Vascular: No hyperdense vessel or unexpected calcification. Skull: Normal. Negative for fracture or  focal lesion. Sinuses/Orbits: Mild mucosal thickening is present in the right maxillary sinus, left sphenoid sinus, and bilateral ethmoid air cells. The orbits are within normal limits. Other: None. IMPRESSION: Wedge-shaped hypodensity in the inferior aspect of the cerebellum on the right, possible infarct of indeterminate age versus other etiology. MRI with contrast is recommended for further evaluation. Electronically Signed   By: Thornell SartoriusLaura  Taylor M.D.   On: 06/12/2021 20:52   MR Brain W and Wo Contrast  Result Date: 06/13/2021 CLINICAL DATA:  Dizziness, headache, weakness EXAM: MRI HEAD WITHOUT AND WITH CONTRAST TECHNIQUE: Multiplanar, multiecho pulse sequences of the brain and surrounding structures were obtained without and with intravenous contrast. CONTRAST:  9.507mL GADAVIST GADOBUTROL 1 MMOL/ML IV SOLN COMPARISON:  06/12/2021 CT head.  No prior MRI. FINDINGS: Brain: Several foci of restricted diffusion in the inferior right cerebellum, the largest of which measures up to 1.6 cm (series 4, image 12). These are associated with T2 hyperintense signal and contrast enhancement (series 11, images 6-10). An additional area of enhancement in the superior posterior right cerebellum (series 11, image 14), is not associated with abnormal signal on diffusion-weighted imaging, which has likely normalized. No other abnormal enhancement. No acute hemorrhage, mass, mass effect, or midline shift. No hydrocephalus or extra-axial collection. No hemosiderin deposition to suggest remote hemorrhage. Vascular: Normal flow voids. Skull and upper cervical spine: Normal marrow signal. Sinuses/Orbits: Mucous retention cysts in the right maxillary  sinus. Mild mucosal thickening in the anterior ethmoid air cells. Otherwise negative. Other: The mastoids are well aerated. IMPRESSION: 1. Acute infarcts in the inferior right cerebellum, in the PICA territory, which demonstrate mildly increased signal on diffusion-weighted imaging, contrast  enhancement, and T2 hyperintensity. 2. Subacute infarct in the superior posterior right cerebellum, in the SCA territory, with normalization on diffusion-weighted imaging but contrast enhancement and T2 hyperintense signal. Given multiple posterior vascular territories and different ages of infarcts, consider a vertebral artery etiology; a CTA of the head and neck is recommended. These results were called by telephone at the time of interpretation on 06/13/2021 at 12:33 pm to provider DR. HORTON, who verbally acknowledged these results. Electronically Signed   By: Wiliam Ke M.D.   On: 06/13/2021 12:34   ECHOCARDIOGRAM COMPLETE BUBBLE STUDY  Result Date: 06/13/2021    ECHOCARDIOGRAM REPORT   Patient Name:   BRITTNYE JOSEPHS Date of Exam: 06/13/2021 Medical Rec #:  [id number]       Height:       66.0 in Accession #:    [id number]      Weight:       225.0 lb Date of Birth:  Aug 22, 1985       BSA:          2.102 m Patient Age:    35 years        BP:           142/111 mmHg Patient Gender: F               HR:           93 bpm. Exam Location:  Inpatient Procedure: 2D Echo and Saline Contrast Bubble Study Indications:    stroke  History:        Patient has no prior history of Echocardiogram examinations.                 Risk Factors:Hypertension.  Sonographer:    Delcie Roch RDCS Referring Phys: [id number] ALLISON WOLFE IMPRESSIONS  1. Left ventricular ejection fraction, by estimation, is 60 to 65%. The left ventricle has normal function. The left ventricle has no regional wall motion abnormalities. Left ventricular diastolic parameters were normal.  2. Area of lateral apical RV hypokinesis . Right ventricular systolic function is mildly reduced. The right ventricular size is mildly enlarged.  3. The mitral valve is normal in structure. No evidence of mitral valve regurgitation. No evidence of mitral stenosis.  4. The aortic valve is tricuspid. Aortic valve regurgitation is not visualized. No aortic stenosis is present.   5. The inferior vena cava is normal in size with greater than 50% respiratory variability, suggesting right atrial pressure of 3 mmHg.  6. Agitated saline contrast bubble study was negative, with no evidence of any interatrial shunt. FINDINGS  Left Ventricle: Left ventricular ejection fraction, by estimation, is 60 to 65%. The left ventricle has normal function. The left ventricle has no regional wall motion abnormalities. The left ventricular internal cavity size was normal in size. There is  no left ventricular hypertrophy. Left ventricular diastolic parameters were normal. Right Ventricle: Area of lateral apical RV hypokinesis. The right ventricular size is mildly enlarged. No increase in right ventricular wall thickness. Right ventricular systolic function is mildly reduced. Left Atrium: Left atrial size was normal in size. Right Atrium: Right atrial size was normal in size. Pericardium: There is no evidence of pericardial effusion. Mitral Valve: The mitral valve is normal in structure. No evidence of  mitral valve regurgitation. No evidence of mitral valve stenosis. Tricuspid Valve: The tricuspid valve is normal in structure. Tricuspid valve regurgitation is not demonstrated. No evidence of tricuspid stenosis. Aortic Valve: The aortic valve is tricuspid. Aortic valve regurgitation is not visualized. No aortic stenosis is present. Pulmonic Valve: The pulmonic valve was normal in structure. Pulmonic valve regurgitation is not visualized. No evidence of pulmonic stenosis. Aorta: The aortic root is normal in size and structure. Venous: The inferior vena cava is normal in size with greater than 50% respiratory variability, suggesting right atrial pressure of 3 mmHg. IAS/Shunts: No atrial level shunt detected by color flow Doppler. Agitated saline contrast was given intravenously to evaluate for intracardiac shunting. Agitated saline contrast bubble study was negative, with no evidence of any interatrial shunt.  LEFT  VENTRICLE PLAX 2D LVIDd:         4.20 cm   Diastology LVIDs:         2.50 cm   LV e' medial:    11.80 cm/s LV PW:         1.10 cm   LV E/e' medial:  6.3 LV IVS:        0.90 cm   LV e' lateral:   15.00 cm/s LVOT diam:     2.10 cm   LV E/e' lateral: 5.0 LV SV:         52 LV SV Index:   25 LVOT Area:     3.46 cm  RIGHT VENTRICLE             IVC RV Basal diam:  2.20 cm     IVC diam: 1.50 cm RV S prime:     14.00 cm/s TAPSE (M-mode): 1.6 cm LEFT ATRIUM             Index        RIGHT ATRIUM           Index LA diam:        3.00 cm 1.43 cm/m   RA Area:     12.70 cm LA Vol (A2C):   33.5 ml 15.93 ml/m  RA Volume:   30.90 ml  14.70 ml/m LA Vol (A4C):   24.9 ml 11.84 ml/m LA Biplane Vol: 30.0 ml 14.27 ml/m  AORTIC VALVE LVOT Vmax:   86.00 cm/s LVOT Vmean:  57.600 cm/s LVOT VTI:    0.150 m  AORTA Ao Root diam: 3.30 cm Ao Asc diam:  2.90 cm MITRAL VALVE MV Area (PHT): 3.99 cm    SHUNTS MV Decel Time: 190 msec    Systemic VTI:  0.15 m MV E velocity: 74.80 cm/s  Systemic Diam: 2.10 cm MV A velocity: 54.80 cm/s MV E/A ratio:  1.36 Charlton Haws MD Electronically signed by Charlton Haws MD Signature Date/Time: 06/13/2021/4:50:26 PM    Final     ECG - SR rate 99 BPM. (See cardiology reading for complete details)   PHYSICAL EXAM  Temp:  [98.2 F (36.8 C)-98.3 F (36.8 C)] 98.2 F (36.8 C) (01/07 1143) Pulse Rate:  [74-93] 93 (01/07 1653) Resp:  [15-18] 18 (01/07 1653) BP: (124-146)/(87-97) 146/97 (01/07 1653) SpO2:  [98 %-100 %] 100 % (01/07 1653)  General - Well nourished, well developed, in no apparent distress.  Ophthalmologic - fundi not visualized due to noncooperation.  Cardiovascular - Regular rhythm and rate.  Mental Status -  Level of arousal and orientation to time, place, and person were intact. Language including expression, naming, repetition, comprehension was assessed and  found intact. Attention span and concentration were normal. Fund of Knowledge was assessed and was intact.  Cranial  Nerves II - XII - II - Visual field intact OU. III, IV, VI - Extraocular movements intact. V - Facial sensation intact bilaterally. VII - Facial movement intact bilaterally. VIII - Hearing & vestibular intact bilaterally. X - Palate elevates symmetrically. XI - Chin turning & shoulder shrug intact bilaterally. XII - Tongue protrusion intact.  Motor Strength - The patients strength was normal in all extremities and pronator drift was absent.  Bulk was normal and fasciculations were absent.   Motor Tone - Muscle tone was assessed at the neck and appendages and was normal.  Reflexes - The patients reflexes were symmetrical in all extremities and she had no pathological reflexes.  Sensory - Light touch, temperature/pinprick were assessed and were symmetrical.    Coordination - The patient had normal movements in the hands and feet with no ataxia or dysmetria.  Tremor was absent.  Gait and Station - deferred.    ASSESSMENT/PLAN Tonya [name] is a 36 y.o. female with history of migraines and GERD, and a remote hx of HTN resolved by weight loss who self-presented from home and consulted to neurology for evaluation of subacute onset of worsening dizziness, weakness, nausea and HA. She did not receive IV t-PA due to late presentation (>4.5 hours from time of onset).  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  MRI head W & WO - Acute infarcts in the inferior right cerebellum, in the PICA territory, which demonstrate mildly increased signal on diffusion-weighted imaging, contrast enhancement, and T2 hyperintensity. Subacute infarct in the superior posterior right cerebellum, in the SCA territory, with normalization on diffusion-weighted imaging but contrast enhancement and T2 hyperintense  signal. CTA H&N - No evidence of dissection. No intracranial large vessel occlusion or significant stenosis.  2D Echo - EF 60 - 65%. No cardiac source of emboli identified.  TCD bubble study no PFO Recommend outpt TEE to rule out other cardioembolic source Recommend outpt Zio patch to rule out irregular heart rhythm  [employer] Virus 2 - negative LDL - 181 HgbA1c - 5.0 Hypercoagulable labs pending UDS - negative VTE prophylaxis - Lovenox No antithrombotic prior to admission, now on aspirin 81 mg daily and clopidogrel 75 mg daily DAPT for 3 weeks and then ASA alone Patient will be counseled to be compliant with her antithrombotic medications Ongoing aggressive stroke risk factor management Therapy recommendations: none Disposition: home today  Hypertension Home BP meds: HCTZ ; Cozaar Current BP meds: Labetalol prn Stable Long-term BP goal normotensive  Hyperlipidemia Home Lipid lowering medication: none  LDL 181, goal < 70 Current lipid lowering medication: Lipitor 80 mg daily  Continue statin at discharge  Tobacco abuse Current vaping tobacco Smoking cessation counseling provided Pt is willing to quit  Other Stroke Risk Factors Former cigarette smoker - quit ETOH use, advised to drink no more than 1 alcoholic beverage per day. Obesity, Body mass index is 36.32 kg/m., recommend weight loss, diet and exercise as appropriate  Family hx stroke (maternal grandfather)  Migraines  Other Active Problems, Findings, Recommendations and/or Plan Code status - Full code  Hypokalemia - potassium - 4.6->3.1 Leukocytosis - WBC's - 17.8->13.5 (afebrile)  Hyponatremia - Na - 133->132  Hospital day # 0  Neurology will  sign off. Please call with questions. Pt [facility] stroke [facility]. Thanks for the consult.  Marvel PlanJindong Forrest Demuro, MD PhD Stroke Neurology 06/15/2021 1:03 AM    To contact Stroke Continuity provider, please refer to  [email or web address]. After hours, contact General Neurology

## 2021-06-15 LAB — LUPUS ANTICOAGULANT PANEL
DRVVT: 47.6 s — ABNORMAL HIGH (ref 0.0–47.0)
PTT Lupus Anticoagulant: 39.4 s (ref 0.0–51.9)

## 2021-06-15 LAB — ANA W/REFLEX IF POSITIVE: Anti Nuclear Antibody (ANA): NEGATIVE

## 2021-06-15 LAB — DRVVT CONFIRM: dRVVT Confirm: 1.3 ratio — ABNORMAL HIGH (ref 0.8–1.2)

## 2021-06-15 LAB — DRVVT MIX: dRVVT Mix: 43.6 s — ABNORMAL HIGH (ref 0.0–40.4)

## 2021-06-16 LAB — BETA-2-GLYCOPROTEIN I ABS, IGG/M/A
Beta-2 Glyco I IgG: <9 GPI IgG units (ref 0–20)
Beta-2-Glycoprotein I IgA: <9 GPI IgA units (ref 0–25)
Beta-2-Glycoprotein I IgM: <9 GPI IgM units (ref 0–32)

## 2021-06-16 LAB — HOMOCYSTEINE: Homocysteine: 7.2 umol/L (ref 0.0–14.5)

## 2021-06-17 LAB — CARDIOLIPIN ANTIBODIES, IGG, IGM, IGA
Anticardiolipin IgA: <9 APL U/mL (ref 0–11)
Anticardiolipin IgG: <9 GPL U/mL (ref 0–14)
Anticardiolipin IgM: <9 MPL U/mL (ref 0–12)

## 2021-06-18 LAB — PHOSPHATIDYLSERINE ANTIBODIES
Phosphatydalserine, IgA: <1 APS Units (ref 0–19)
Phosphatydalserine, IgG: <9 Units (ref 0–30)
Phosphatydalserine, IgM: <10 Units (ref 0–30)

## 2021-06-19 ENCOUNTER — Other Ambulatory Visit: Payer: Self-pay | Admitting: Cardiology

## 2021-06-19 DIAGNOSIS — I4891 Unspecified atrial fibrillation: Secondary | ICD-10-CM

## 2021-06-19 DIAGNOSIS — I639 Cerebral infarction, unspecified: Secondary | ICD-10-CM

## 2021-06-19 NOTE — Progress Notes (Signed)
[facility] asked to place cardiac monitor in the setting of CVA. Follow up arranged in the office with Dr. Servando Salina.

## 2021-06-26 ENCOUNTER — Ambulatory Visit (INDEPENDENT_AMBULATORY_CARE_PROVIDER_SITE_OTHER): Payer: PRIVATE HEALTH INSURANCE

## 2021-06-26 DIAGNOSIS — I639 Cerebral infarction, unspecified: Secondary | ICD-10-CM | POA: Diagnosis not present

## 2021-06-26 DIAGNOSIS — I4891 Unspecified atrial fibrillation: Secondary | ICD-10-CM

## 2021-07-10 ENCOUNTER — Ambulatory Visit: Payer: PRIVATE HEALTH INSURANCE | Admitting: Family

## 2021-07-14 ENCOUNTER — Encounter: Payer: Self-pay | Admitting: Neurology

## 2021-07-14 ENCOUNTER — Ambulatory Visit (INDEPENDENT_AMBULATORY_CARE_PROVIDER_SITE_OTHER): Payer: PRIVATE HEALTH INSURANCE | Admitting: Neurology

## 2021-07-14 VITALS — BP 137/81 | HR 94 | Ht 66.0 in | Wt 221.5 lb

## 2021-07-14 DIAGNOSIS — Z9189 Other specified personal risk factors, not elsewhere classified: Secondary | ICD-10-CM | POA: Diagnosis not present

## 2021-07-14 DIAGNOSIS — B079 Viral wart, unspecified: Secondary | ICD-10-CM | POA: Insufficient documentation

## 2021-07-14 DIAGNOSIS — I639 Cerebral infarction, unspecified: Secondary | ICD-10-CM | POA: Diagnosis not present

## 2021-07-14 NOTE — Progress Notes (Signed)
[facility] [address] [place]. [place] [address] [phone]       OFFICE CONSULT NOTE  [name] Date of Birth:  05/26/1986 Medical Record Number:  [id number]   Referring MD:   Vance Gather  Reason for Referral: Stroke  HPI: [name] is a 36 year old pleasant Caucasian lady seen today for initial office consultation for stroke.  History is obtained from the patient and review of electronic medical records and opossum reviewed pertinent available imaging films in PACS.  She has no significant past medical history is except migraines and gastroesophageal reflux disease and hypertension.  She presented on 06/12/2021 for evaluation for symptoms which initially began on 06/05/2021.  She woke up that day feeling profoundly dizzy and off balance and leaning to the right as well as having some vertigo and subsequently developed sharp headache.  She went to sleep for few hours and when she woke up she saw a lot of floaters and she called some friends when nurse came home and checked her blood pressure was significantly elevated.  Next day she went to work but felt dizzy and blood pressure was high so she saw a telehealth provider who prescribed some Norvasc eventually she got an appointment to see a primary care physician who also added an additional medication for blood pressure which remained high.  Eventually she drove herself to the emergency room 06/12/2021 for evaluation.  Her main complaint at that time was that she felt like her body was moving in slow motion as if she was floating in she had trouble walking but denies any focal extremity weakness.  CT scan of the head showed a wedge-shaped hypodensity in the right inferior aspect of the cerebellum and MRI scan subsequently showed acute infarct in the right posterior inferior cerebellum and wedge-shaped distribution as well as subacute infarct in the right superior cerebellum.  CT angiogram showed no significant  large vessel stenosis or occlusion.  LDL cholesterol was elevated 181 mg percent.  Hemoglobin A1c was 5.0.  Transcranial Doppler bubble study was negative for PFO.  2D echo showed ejection fraction of 60 to 65%.  TEE was ordered but is not yet done and is scheduled for 07/29/2020.  Patient had hypercoagulable labs sent most of it was negative except for positive lupus anticoagulant at 47.6 but patient denies any history of DVT, pulmonary embolism, recurrent miscarriages.  She also wore a 3-week external heart monitor the results of which are not back yet.  Patient denies any prior history of strokes, TIAs, seizures, loss of consciousness, palpitations or syncope.  She does not use any birth control pills or hormones.  She denies doing drugs.  She was doing some vaping at the time of her stroke which she has quit.  She states she is done well her dizziness is mostly resolved she has had no recurrent symptoms.  She however feels her stamina is quite down and she gets tired easily and is exhausted all the time.  She does admit to snoring but she has not been evaluated for sleep apnea though she has a strong family history for the same.  She also has a family history of significant coronary artery disease in the 13s and 65s in her father as well as grandfather.  She does have a few migraine headaches but these only once every 2 to 3 months and respond to over-the-counter medications.  She is currently tolerating aspirin and Plavix but does bruise easily especially when her dog  scratches her.  She is tolerating Lipitor well without muscle aches and pains.  Blood pressure is now much better controlled on Cozaar and Hydrodiurill and today it is 137/81.  ROS:   14 system review of systems is positive for tiredness, decreased stamina, easy bruising, and all other systems negative  PMH:  Past Medical History:  Diagnosis Date   Cystitis    Occas   Dizziness    Frequent UTI    GERD (gastroesophageal reflux disease)     Hand tingling    And feet   Headache(784.0)    Hypertension    no meds, patient reports this only while pregnant, not currently   PONV (postoperative nausea and vomiting)    Rapid heart rate    during pregnancy only   Seasonal allergies     Social History:  Social History   Socioeconomic History   Marital status: Married    Spouse name: Not on file   Number of children: Not on file   Years of education: Not on file   Highest education level: Not on file  Occupational History   Not on file  Tobacco Use   Smoking status: Former    Types: Cigarettes    Quit date: 2015    Years since quitting: 8.1   Smokeless tobacco: Never  Vaping Use   Vaping Use: Former   Quit date: 03/06/2020  Substance and Sexual Activity   Alcohol use: Yes    Alcohol/week: 0.0 standard drinks    Comment: occasional   Drug use: No   Sexual activity: Not Currently    Birth control/protection: None    Comment: Hysterectomy  Other Topics Concern   Not on file  Social History Narrative   Not on file   Social Determinants of Health   Financial Resource Strain: Not on file  Food Insecurity: Not on file  Transportation Needs: Not on file  Physical Activity: Not on file  Stress: Not on file  Social Connections: Not on file  Intimate Partner Violence: Not on file    Medications:   Current Outpatient Medications on File Prior to Visit  Medication Sig Dispense Refill   aspirin EC 81 MG EC tablet Take 1 tablet (81 mg total) by mouth daily. 30 tablet 0   atorvastatin (LIPITOR) 80 MG tablet Take 1 tablet (80 mg total) by mouth daily. 30 tablet 0   clopidogrel (PLAVIX) 75 MG tablet Take 75 mg by mouth daily.     fluticasone (FLONASE) 50 MCG/ACT nasal spray Place 1 spray into both nostrils daily as needed for allergies or rhinitis.     hydrochlorothiazide (HYDRODIURIL) 25 MG tablet Take 1 tablet (25 mg total) by mouth daily. 30 tablet 0   Ibuprofen (ADVIL PO) Take 1 tablet by mouth every 6 (six)  hours as needed (headache).     losartan (COZAAR) 100 MG tablet Take 100 mg by mouth daily.     No current facility-administered medications on file prior to visit.    Allergies:   Allergies  Allergen Reactions   Green Dyes Hives   Ibuprofen Other (See Comments)    Esophagitis and chest pain - pt states that its ok to take with heartburn medication    Physical Exam General: Mildly obese young Caucasian lady, seated, in no evident distress Head: head normocephalic and atraumatic.   Neck: supple with no carotid or supraclavicular bruits Cardiovascular: regular rate and rhythm, no murmurs Musculoskeletal: no deformity Skin:  no rash/petichiae Vascular:  Normal pulses  all extremities  Neurologic Exam Mental Status: Awake and fully alert. Oriented to place and time. Recent and remote memory intact. Attention span, concentration and fund of knowledge appropriate. Mood and affect appropriate.  Cranial Nerves: Fundoscopic exam reveals sharp disc margins. Pupils equal, briskly reactive to light. Extraocular movements full without nystagmus. Visual fields full to confrontation. Hearing intact. Facial sensation intact. Face, tongue, palate moves normally and symmetrically.  Motor: Normal bulk and tone. Normal strength in all tested extremity muscles. Sensory.: intact to touch , pinprick , position and vibratory sensation.  Coordination: Rapid alternating movements normal in all extremities. Finger-to-nose and heel-to-shin performed accurately bilaterally. Gait and Station: Arises from chair without difficulty. Stance is normal. Gait demonstrates normal stride length and balance . Able to heel, toe and tandem walk without difficulty.  Reflexes: 1+ and symmetric. Toes downgoing.   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  Vascular risk factors of hypertension, hyperlipidemia obesity and at risk for sleep apnea.  Weakly positive lupus anticoagulant but in the absence of history of DVT, pulmonary embolism this is likely an acute phase reactant and will need to be repeated.     PLAN:I had a long discussion with the patient regarding her cryptogenic cerebellar infarcts and discussed results of her available diagnostic testing and further ongoing testing stroke prevention and treatment and answered questions.  I recommend she discontinue Plavix and stay on aspirin alone it has been more than 3 weeks and she is having significant bruising.  Maintain aggressive risk factor modification with strict control of hypertension with blood pressure goal below 130/90, lipids with LDL cholesterol goal below 70 mg percent and diabetes with hemoglobin A1c goal below 6.5%.  I also encouraged her to eat a healthy diet with lots of fruits, vegetables, cereals, whole grains and also to exercise regularly and lose weight.  She was advised to keep her scheduled appointment for TEE later this month.  Check polysomnogram for sleep apnea and repeat lupus anticoagulant and lipid profile prior to next follow-up visit in 3 months.  Greater than 50% time during this 45-minute consultation visit was spent on counseling and coordination of care about her cryptogenic stroke and discussion about stroke evaluation and treatment and answering questions. Antony Contras, MD  Note: This document was prepared with digital dictation and possible smart phrase technology. Any transcriptional errors that result from this process are unintentional.

## 2021-07-14 NOTE — Patient Instructions (Signed)
I had a long discussion with the patient regarding her cryptogenic cerebellar infarcts and discussed results of her available diagnostic testing and further ongoing testing stroke prevention and treatment and answered questions.  I recommend she discontinue Plavix and stay on aspirin alone it has been more than 3 weeks and she is having significant bruising.  Maintain aggressive risk factor modification with strict control of hypertension with blood pressure goal below 130/90, lipids with LDL cholesterol goal below 70 mg percent and diabetes with hemoglobin A1c goal below 6.5%.  I also encouraged her to eat a healthy diet with lots of fruits, vegetables, cereals, whole grains and also to exercise regularly and lose weight.  She was advised to keep her scheduled appointment for TEE later this month.  Check polysomnogram for sleep apnea and repeat lupus anticoagulant and lipid profile prior to next follow-up visit in 3 months. Stroke Prevention Some medical conditions and behaviors can lead to a higher chance of having a stroke. You can help prevent a stroke by eating healthy, exercising, not smoking, and managing any medical conditions you have. Stroke is a leading cause of functional impairment. Primary prevention is particularly important because a majority of strokes are first-time events. Stroke changes the lives of not only those who experience a stroke but also their family and other caregivers. How can this condition affect me? A stroke is a medical emergency and should be treated right away. A stroke can lead to brain damage and can sometimes be life-threatening. If a person gets medical treatment right away, there is a better chance of surviving and recovering from a stroke. What can increase my risk? The following medical conditions may increase your risk of a stroke: Cardiovascular disease. High blood pressure (hypertension). Diabetes. High cholesterol. Sickle cell disease. Blood clotting  disorders (hypercoagulable state). Obesity. Sleep disorders (obstructive sleep apnea). Other risk factors include: Being older than age 36. Having a history of blood clots, stroke, or mini-stroke (transient ischemic attack, TIA). Genetic factors, such as race, ethnicity, or a family history of stroke. Smoking cigarettes or using other tobacco products. Taking birth control pills, especially if you also use tobacco. Heavy use of alcohol or drugs, especially cocaine and methamphetamine. Physical inactivity. What actions can I take to prevent this? Manage your health conditions High cholesterol levels. Eating a healthy diet is important for preventing high cholesterol. If cholesterol cannot be managed through diet alone, you may need to take medicines. Take any prescribed medicines to control your cholesterol as told by your health care provider. Hypertension. To reduce your risk of stroke, try to keep your blood pressure below 130/80. Eating a healthy diet and exercising regularly are important for controlling blood pressure. If these steps are not enough to manage your blood pressure, you may need to take medicines. Take any prescribed medicines to control hypertension as told by your health care provider. Ask your health care provider if you should monitor your blood pressure at home. Have your blood pressure checked every year, even if your blood pressure is normal. Blood pressure increases with age and some medical conditions. Diabetes. Eating a healthy diet and exercising regularly are important parts of managing your blood sugar (glucose). If your blood sugar cannot be managed through diet and exercise, you may need to take medicines. Take any prescribed medicines to control your diabetes as told by your health care provider. Get evaluated for obstructive sleep apnea. Talk to your health care provider about getting a sleep evaluation if you snore a  lot or have excessive sleepiness. Make  sure that any other medical conditions you have, such as atrial fibrillation or atherosclerosis, are managed. Nutrition Follow instructions from your health care provider about what to eat or drink to help manage your health condition. These instructions may include: Reducing your daily calorie intake. Limiting how much salt (sodium) you use to 1,500 milligrams (mg) each day. Using only healthy fats for cooking, such as olive oil, canola oil, or sunflower oil. Eating healthy foods. You can do this by: Choosing foods that are high in fiber, such as whole grains, and fresh fruits and vegetables. Eating at least 5 servings of fruits and vegetables a day. Try to fill one-half of your plate with fruits and vegetables at each meal. Choosing lean protein foods, such as lean cuts of meat, poultry without skin, fish, tofu, beans, and nuts. Eating low-fat dairy products. Avoiding foods that are high in sodium. This can help lower blood pressure. Avoiding foods that have saturated fat, trans fat, and cholesterol. This can help prevent high cholesterol. Avoiding processed and prepared foods. Counting your daily carbohydrate intake.  Lifestyle If you drink alcohol: Limit how much you have to: 0-1 drink a day for women who are not pregnant. 0-2 drinks a day for men. Know how much alcohol is in your drink. In the [place] one drink equals one 12 oz bottle of beer (337mL), one 5 oz glass of wine (159mL), or one 1 oz glass of hard liquor (54mL). Do not use any products that contain nicotine or tobacco. These products include cigarettes, chewing tobacco, and vaping devices, such as e-cigarettes. If you need help quitting, ask your health care provider. Avoid secondhand smoke. Do not use drugs. Activity  Try to stay at a healthy weight. Get at least 30 minutes of exercise on most days, such as: Fast walking. Biking. Swimming. Medicines Take over-the-counter and prescription medicines only as told by your  health care provider. Aspirin or blood thinners (antiplatelets or anticoagulants) may be recommended to reduce your risk of forming blood clots that can lead to stroke. Avoid taking birth control pills. Talk to your health care provider about the risks of taking birth control pills if: You are over 30 years old. You smoke. You get very bad headaches. You have had a blood clot. Where to find more information American Stroke Association: www.strokeassociation.org Get help right away if: You or a loved one has any symptoms of a stroke. "BE FAST" is an easy way to remember the main warning signs of a stroke: B - Balance. Signs are dizziness, sudden trouble walking, or loss of balance. E - Eyes. Signs are trouble seeing or a sudden change in vision. F - Face. Signs are sudden weakness or numbness of the face, or the face or eyelid drooping on one side. A - Arms. Signs are weakness or numbness in an arm. This happens suddenly and usually on one side of the body. S - Speech. Signs are sudden trouble speaking, slurred speech, or trouble understanding what people say. T - Time. Time to call emergency services. Write down what time symptoms started. You or a loved one has other signs of a stroke, such as: A sudden, severe headache with no known cause. Nausea or vomiting. Seizure. These symptoms may represent a serious problem that is an emergency. Do not wait to see if the symptoms will go away. Get medical help right away. Call your local emergency services (911 in the [place] Do not drive yourself  to the hospital. Summary You can help to prevent a stroke by eating healthy, exercising, not smoking, limiting alcohol intake, and managing any medical conditions you may have. Do not use any products that contain nicotine or tobacco. These include cigarettes, chewing tobacco, and vaping devices, such as e-cigarettes. If you need help quitting, ask your health care provider. Remember "BE FAST" for warning  signs of a stroke. Get help right away if you or a loved one has any of these signs. This information is not intended to replace advice given to you by your health care provider. Make sure you discuss any questions you have with your health care provider. Document Revised: 12/25/2019 Document Reviewed: 12/25/2019 Elsevier Patient Education  [place].

## 2021-07-25 ENCOUNTER — Encounter: Payer: Self-pay | Admitting: Neurology

## 2021-07-25 ENCOUNTER — Encounter: Payer: Self-pay | Admitting: Cardiology

## 2021-07-29 ENCOUNTER — Encounter: Payer: Self-pay | Admitting: Cardiology

## 2021-07-29 ENCOUNTER — Ambulatory Visit (INDEPENDENT_AMBULATORY_CARE_PROVIDER_SITE_OTHER): Payer: PRIVATE HEALTH INSURANCE | Admitting: Cardiology

## 2021-07-29 ENCOUNTER — Other Ambulatory Visit: Payer: Self-pay

## 2021-07-29 VITALS — BP 122/72 | HR 89 | Ht 66.0 in | Wt 215.8 lb

## 2021-07-29 DIAGNOSIS — Z79899 Other long term (current) drug therapy: Secondary | ICD-10-CM

## 2021-07-29 DIAGNOSIS — I10 Essential (primary) hypertension: Secondary | ICD-10-CM | POA: Diagnosis not present

## 2021-07-29 DIAGNOSIS — I639 Cerebral infarction, unspecified: Secondary | ICD-10-CM | POA: Diagnosis not present

## 2021-07-29 DIAGNOSIS — R42 Dizziness and giddiness: Secondary | ICD-10-CM

## 2021-07-29 DIAGNOSIS — E7849 Other hyperlipidemia: Secondary | ICD-10-CM | POA: Insufficient documentation

## 2021-07-29 NOTE — Patient Instructions (Addendum)
Medication Instructions:  Your physician recommends that you continue on your current medications as directed. Please refer to the Current Medication list given to you today.  *If you need a refill on your cardiac medications before your next appointment, please call your pharmacy*   Lab Work: Your physician recommends that you return for lab work in:  TODAY: Vitamin D, Vitamin B12, BMET, Mag, CBC If you have labs (blood work) drawn today and your tests are completely normal, you will receive your results only by: MyChart Message (if you have MyChart) OR A paper copy in the mail If you have any lab test that is abnormal or we need to change your treatment, we will call you to review the results.   Testing/Procedures: Your physician has requested that you have a TEE. During a TEE, sound waves are used to create images of your heart. It provides your doctor with information about the size and shape of your heart and how well your hearts chambers and valves are working. In this test, a transducer is attached to the end of a flexible tube thats guided down your throat and into your esophagus (the tube leading from you mouth to your stomach) to get a more detailed image of your heart. You are not awake for the procedure. Please see the instruction sheet given to you today. For further information please visit [email or web address].  You are scheduled for a TEE on March 13.  Please arrive at the [facility] (Main Entrance A) at [facility]: [address] [place], [place] [address] at 10:00am. (1 hour prior to procedure unless lab work is needed; if lab work is needed arrive 1.5 hours ahead)  DIET: Nothing to eat or drink after midnight except a sip of water with medications (see medication instructions below)  Medication Instructions:  Continue your anticoagulant You will need to continue your anticoagulant after your procedure until you are told by   your provider that it is safe to  stop   Labs: YOU WILL HAVE YOUR LABS DRAWN ON TODAY.  You must have a responsible person to drive you home and stay in the waiting area during your procedure. Failure to do so could result in cancellation.  Bring your insurance cards.  *Special Note: Every effort is made to have your procedure done on time. Occasionally there are emergencies that occur at the hospital that may cause delays. Please be patient if a delay does occur.      Follow-Up: At [facility], you and your health needs are our priority.  As part of our continuing mission to provide you with exceptional heart care, we have created designated Provider Care Teams.  These Care Teams include your primary Cardiologist (physician) and Advanced Practice Providers (APPs -  Physician Assistants and Nurse Practitioners) who all work together to provide you with the care you need, when you need it.  We recommend signing up for the patient portal called "MyChart".  Sign up information is provided on this After Visit Summary.  MyChart is used to connect with patients for Virtual Visits (Telemedicine).  Patients are able to view lab/test results, encounter notes, upcoming appointments, etc.  Non-urgent messages can be sent to your provider as well.   To learn more about what you can do with MyChart, go to [email or web address].    Your next appointment:   6 month(s)  The format for your next appointment:   In Person  Provider:   Berniece Salines, DO  Other Instructions  [place] at [facility] [address] [place], [place] [address] [phone] (office) [phone] (fax)

## 2021-07-29 NOTE — Progress Notes (Signed)
°Cardiology Office Note:   ° °Date:  07/29/2021  ° °ID:  Dealva H Lease, DOB 01/03/1986, MRN [id number] ° °PCP:  Carroll, Erin, PA-C  °Cardiologist:  Eden Rho, DO  °Electrophysiologist:  None  ° °Referring MD: Carroll, Erin, PA-C  ° °" I am experiencing numbness and tingling and still feels very tired" ° ° °History of Present Illness:   ° °Tonya [name] is a 36 y.o. female with a hx of hypertension, hyperlipidemia, obesity who was recently admitted in January 2023 at that time she presented because she was experiencing limb heaviness as well as dizziness and intermittent vision changes.  While admitted she was noted to have elevated blood pressure as well as CT of the head showed wedge-shaped hypodensity in the inferior aspect of the cerebellum on the right.  MRI revealed acute infarcts in the inferior right cerebellum, PICA territory.  There was also subacute infarct in the superior posterior right cerebellum, MCA territory.  There were no evidence of LVO stenosis or dissection on CTA.  Neurology was consulted because an echocardiogram which showed no evidence of depressed ejection fraction and LVEF was normal and there is no evidence of PFO and noted area of moderate apical RV hypokinesis but no other structural abnormality..  They had entertained the discussion of hypercoagulable work-up which was deferred to the outpatient setting. ° ° °She is here today to establish cardiac care.  She is posthospitalization from her CVA.  She says that she had been feeling a little better.  Over the last several days she has had feelings where she feels heaviness in her upper extremity as well as some numbness and tingling's.  She also does feel tired.  She notes that recently at work she has some dizziness.  She did not pass out. ° °She did wear the monitor for close to 2 weeks but did experience Significant rash and could not continue this.  She may have the monitor back. ° °Past Medical History:  °Diagnosis Date  °  Cystitis   ° Occas  ° Dizziness   ° Frequent UTI   ° GERD (gastroesophageal reflux disease)   ° Hand tingling   ° And feet  ° Headache(784.0)   ° Hypertension   ° no meds, patient reports this only while pregnant, not currently  ° PONV (postoperative nausea and vomiting)   ° Rapid heart rate   ° during pregnancy only  ° Seasonal allergies   ° ° °Past Surgical History:  °Procedure Laterality Date  ° ABDOMINAL HYSTERECTOMY    ° ADENOIDECTOMY    ° as a child  ° BACK SURGERY  2012  ° BREAST LUMPECTOMY Left   ° Benign  ° LAPAROSCOPIC VAGINAL HYSTERECTOMY WITH SALPINGECTOMY Bilateral 03/18/2017  ° Procedure: LAPAROSCOPIC ASSISTED VAGINAL HYSTERECTOMY WITH SALPINGECTOMY;  Surgeon: Adkins, Gretchen, MD;  Location: [facility];  Service: Gynecology;  Laterality: Bilateral;  NEEDS BED  ° LUMBAR LAMINECTOMY Left 01/25/2019  ° Procedure: LEFT LUMBAR FIVE TO SACRAL ONE, RECURRENT MICRODISCECTOMY;  Surgeon: Yates, Mark C, MD;  Location: [facility];  Service: Orthopedics;  Laterality: Left;  ° LUMBAR LAMINECTOMY/DECOMPRESSION MICRODISCECTOMY N/A 04/10/2019  ° Procedure: LEFT L5-S1 REDO MICRODISCECTOMY;  Surgeon: Nitka, James E, MD;  Location: [facility];  Service: Orthopedics;  Laterality: N/A;  ° SHOULDER SURGERY  2008, 2009  ° L shoulder, acromion shave and recurrent dislocation.  ° SPINE SURGERY  03/06/11  ° lumbar disc L5  ° TUBAL LIGATION Bilateral 05/07/2014  ° Procedure: POST PARTUM TUBAL   LIGATION;  Surgeon: Gretchen Adkins, MD;  Location: [facility];  Service: Gynecology;  Laterality: Bilateral;  ° VAGINAL DELIVERY  2014, 2015  ° WISDOM TOOTH EXTRACTION    ° ° °Current Medications: °Current Meds  °Medication Sig  ° aspirin EC 81 MG EC tablet Take 1 tablet (81 mg total) by mouth daily.  ° atorvastatin (LIPITOR) 80 MG tablet Take 1 tablet (80 mg total) by mouth daily.  ° hydrochlorothiazide (HYDRODIURIL) 25 MG tablet Take 1 tablet (25 mg total) by mouth daily.  ° Ibuprofen (ADVIL PO) Take 1 tablet by mouth every 6 (six) hours  as needed (headache).  ° losartan (COZAAR) 100 MG tablet Take 100 mg by mouth daily.  °  ° °Allergies:   Green dyes and Ibuprofen  ° °Social History  ° °Socioeconomic History  ° Marital status: Married  °  Spouse name: Not on file  ° Number of children: Not on file  ° Years of education: Not on file  ° Highest education level: Not on file  °Occupational History  ° Not on file  °Tobacco Use  ° Smoking status: Former  °  Types: Cigarettes  °  Quit date: 2015  °  Years since quitting: 8.1  ° Smokeless tobacco: Never  °Vaping Use  ° Vaping Use: Former  ° Quit date: 03/06/2020  °Substance and Sexual Activity  ° Alcohol use: Yes  °  Alcohol/week: 0.0 standard drinks  °  Comment: occasional  ° Drug use: No  ° Sexual activity: Not Currently  °  Birth control/protection: None  °  Comment: Hysterectomy  °Other Topics Concern  ° Not on file  °Social History Narrative  ° Not on file  ° °Social Determinants of Health  ° °Financial Resource Strain: Not on file  °Food Insecurity: Not on file  °Transportation Needs: Not on file  °Physical Activity: Not on file  °Stress: Not on file  °Social Connections: Not on file  °  ° °Family History: °The patient's family history includes Diabetes in her maternal grandfather and paternal uncle; Hyperlipidemia in her paternal grandfather; Hypertension in her maternal grandfather, maternal grandmother, paternal grandfather, and paternal grandmother; Obesity in an other family member; Sleep apnea in an other family member; Stroke in her maternal grandfather; Thrombophlebitis in her paternal grandfather. ° °ROS:   °Review of Systems  °Constitution: Negative for decreased appetite, fever and weight gain.  °HENT: Negative for congestion, ear discharge, hoarse voice and sore throat.   °Eyes: Negative for discharge, redness, vision loss in right eye and visual halos.  °Cardiovascular: Negative for chest pain, dyspnea on exertion, leg swelling, orthopnea and palpitations.  °Respiratory: Negative for  cough, hemoptysis, shortness of breath and snoring.   °Endocrine: Negative for heat intolerance and polyphagia.  °Hematologic/Lymphatic: Negative for bleeding problem. Does not bruise/bleed easily.  °Skin: Negative for flushing, nail changes, rash and suspicious lesions.  °Musculoskeletal: Negative for arthritis, joint pain, muscle cramps, myalgias, neck pain and stiffness.  °Gastrointestinal: Negative for abdominal pain, bowel incontinence, diarrhea and excessive appetite.  °Genitourinary: Negative for decreased libido, genital sores and incomplete emptying.  °Neurological: Negative for brief paralysis, focal weakness, headaches and loss of balance.  °Psychiatric/Behavioral: Negative for altered mental status, depression and suicidal ideas.  °Allergic/Immunologic: Negative for HIV exposure and persistent infections.  ° ° °EKGs/Labs/Other Studies Reviewed:   ° °The following studies were reviewed today: ° ° °EKG:  None today ° ° °TTE 06/2021 IMPRESSIONS  ° 1. Left ventricular ejection fraction, by estimation, is 60 to   65%. The left ventricle has normal function. The left ventricle has no regional  °wall motion abnormalities. Left ventricular diastolic parameters were normal.  ° 2. Area of lateral apical RV hypokinesis . Right ventricular systolic function is mildly reduced. The right ventricular size is mildly enlarged.  ° 3. The mitral valve is normal in structure. No evidence of mitral valve regurgitation. No evidence of mitral stenosis.  ° 4. The aortic valve is tricuspid. Aortic valve regurgitation is not visualized. No aortic stenosis is present.  ° 5. The inferior vena cava is normal in size with greater than 50% respiratory variability, suggesting right atrial pressure of 3 mmHg.  ° 6. Agitated saline contrast bubble study was negative, with no evidence of any interatrial shunt.  ° °FINDINGS  ° Left Ventricle: Left ventricular ejection fraction, by estimation, is 60 to 65%. The left ventricle has normal  function. The left ventricle has no regional wall motion abnormalities. The left ventricular internal cavity size was normal in size. There is  ° no left ventricular hypertrophy. Left ventricular diastolic parameters were normal.  ° °Right Ventricle: Area of lateral apical RV hypokinesis. The right ventricular size is mildly enlarged. No increase in right ventricular wall  °thickness. Right ventricular systolic function is mildly reduced.  ° °Left Atrium: Left atrial size was normal in size.  ° °Right Atrium: Right atrial size was normal in size.  ° °Pericardium: There is no evidence of pericardial effusion.  ° °Mitral Valve: The mitral valve is normal in structure. No evidence of mitral valve regurgitation. No evidence of mitral valve stenosis.  ° °Tricuspid Valve: The tricuspid valve is normal in structure. Tricuspid valve regurgitation is not demonstrated. No evidence of tricuspid  °stenosis.  ° °Aortic Valve: The aortic valve is tricuspid. Aortic valve regurgitation is not visualized. No aortic stenosis is present.  ° °Pulmonic Valve: The pulmonic valve was normal in structure. Pulmonic valve regurgitation is not visualized. No evidence of pulmonic stenosis.  ° °Aorta: The aortic root is normal in size and structure.  ° °Venous: The inferior vena cava is normal in size with greater than 50% respiratory variability, suggesting right atrial pressure of 3 mmHg.  ° °IAS/Shunts: No atrial level shunt detected by color flow Doppler. Agitated saline contrast was given intravenously to evaluate for intracardiac shunting. Agitated saline contrast bubble study was negative, with no evidence of any interatrial shunt.  ° ° °Recent Labs: °06/13/2021: BUN 9; Creatinine, Ser 0.53; Hemoglobin 15.4; Platelets 295; Potassium 3.1; Sodium 132; TSH 1.918  °Recent Lipid Panel °   °Component Value Date/Time  ° CHOL 251 (H) 06/13/2021 1458  ° TRIG 146 06/13/2021 1458  ° HDL 41 06/13/2021 1458  ° CHOLHDL 6.1 06/13/2021 1458  ° VLDL 29  06/13/2021 1458  ° LDLCALC 181 (H) 06/13/2021 1458  ° LDLDIRECT 141.9 01/06/2012 0922  ° ° °Physical Exam:   ° °VS:  BP 122/72    Pulse 89    Ht 5' 6" (1.676 m)    Wt 215 lb 12.8 oz (97.9 kg)    LMP 03/02/2017 (LMP Unknown)    SpO2 98%    BMI 34.83 kg/m²    ° °Wt Readings from Last 3 Encounters:  °07/29/21 215 lb 12.8 oz (97.9 kg)  °07/14/21 221 lb 8 oz (100.5 kg)  °06/13/21 225 lb (102.1 kg)  °  ° °GEN: Well nourished, well developed in no acute distress °HEENT: Normal °NECK: No JVD; No carotid bruits °LYMPHATICS: No lymphadenopathy °CARDIAC: S1S2 noted,RRR, no murmurs,   rubs, gallops °RESPIRATORY:  Clear to auscultation without rales, wheezing or rhonchi  °ABDOMEN: Soft, non-tender, non-distended, +bowel sounds, no guarding. °EXTREMITIES: No edema, No cyanosis, no clubbing °MUSCULOSKELETAL:  No deformity  °SKIN: Warm and dry °NEUROLOGIC:  Alert and oriented x 3, non-focal °PSYCHIATRIC:  Normal affect, good insight ° °ASSESSMENT:   ° °1. Cerebrovascular accident (CVA), unspecified mechanism (HCC)   °2. Essential hypertension   °3. Other hyperlipidemia   °4. Dizziness   °5. Medication management   ° °PLAN:   ° °Recent CVA and is status post hospitalization for this.  She has been getting back to her normal life.  She had an echocardiogram in the hospital.  She also wore a monitor which results are not out yet.  To understand what to make sure this is not cardioembolic we will get a TEE to assess for any LAA thrombus or any significant ductal abnormalities. ° °Her hypercoagulable panel has 1 abnormal there is an elevated value on her hypercoagulable panel.  She has not been evaluated by heme-onc will refer the patient as well. ° °Blood pressure is acceptable, continue with current antihypertensive regimen. ° °Hyperlipidemia - continue with current statin medication. ° °The patient is in agreement with the above plan. The patient left the office in stable condition.  The patient will follow up in 6 months or sooner if  needed. ° ° °Medication Adjustments/Labs and Tests Ordered: °Current medicines are reviewed at length with the patient today.  Concerns regarding medicines are outlined above.  °Orders Placed This Encounter  °Procedures  ° VITAMIN D 25 Hydroxy (Vit-D Deficiency, Fractures)  ° B12  ° Basic Metabolic Panel (BMET)  ° CBC with Differential/Platelet  ° Magnesium  ° Ambulatory referral to Hematology / Oncology  ° °No orders of the defined types were placed in this encounter. ° ° °Patient Instructions  °Medication Instructions:  °Your physician recommends that you continue on your current medications as directed. Please refer to the Current Medication list given to you today.  °*If you need a refill on your cardiac medications before your next appointment, please call your pharmacy* ° ° °Lab Work: °Your physician recommends that you return for lab work in:  °TODAY: Vitamin D, Vitamin B12, BMET, Mag, CBC °If you have labs (blood work) drawn today and your tests are completely normal, you will receive your results only by: °MyChart Message (if you have MyChart) OR °A paper copy in the mail °If you have any lab test that is abnormal or we need to change your treatment, we will call you to review the results. ° ° °Testing/Procedures: °Your physician has requested that you have a TEE. During a TEE, sound waves are used to create images of your heart. It provides your doctor with information about the size and shape of your heart and how well your heart’s chambers and valves are working. In this test, a transducer is attached to the end of a flexible tube that’s guided down your throat and into your esophagus (the tube leading from you mouth to your stomach) to get a more detailed image of your heart. You are not awake for the procedure. Please see the instruction sheet given to you today. For further information please visit [email or web address]. ° °You are scheduled for a TEE on March 13.  Please arrive at the [facility] (Main  Entrance A) at [facility]: [address] [place],  [address] at 10:00am. (1 hour prior to procedure unless lab work is needed; if   lab work is needed arrive 1.5 hours ahead) ° °DIET: Nothing to eat or drink after midnight except a sip of water with medications (see medication instructions below) ° °Medication Instructions: ° °Continue your anticoagulant °You will need to continue your anticoagulant after your procedure until you are told by   your provider that it is safe to stop ° ° °Labs: YOU WILL HAVE YOUR LABS DRAWN ON TODAY. ° °You must have a responsible person to drive you home and stay in the waiting area during your procedure. Failure to do so could result in cancellation. ° °Bring your insurance cards. ° °*Special Note: Every effort is made to have your procedure done on time. Occasionally there are emergencies that occur at the hospital that may cause delays. Please be patient if a delay does occur.  ° ° ° ° °Follow-Up: °At [facility], you and your health needs are our priority.  As part of our continuing mission to provide you with exceptional heart care, we have created designated Provider Care Teams.  These Care Teams include your primary Cardiologist (physician) and Advanced Practice Providers (APPs -  Physician Assistants and Nurse Practitioners) who all work together to provide you with the care you need, when you need it. ° °We recommend signing up for the patient portal called "MyChart".  Sign up information is provided on this After Visit Summary.  MyChart is used to connect with patients for Virtual Visits (Telemedicine).  Patients are able to view lab/test results, encounter notes, upcoming appointments, etc.  Non-urgent messages can be sent to your provider as well.   °To learn more about what you can do with MyChart, go to [email or web address].   ° °Your next appointment:   °6 month(s) ° °The format for your next appointment:   °In Person ° °Provider:   °Wesson Stith, DO    ° ° °Other Instructions ° [facility] [address] °[address] °[place], [place] [address] °[phone] (office) °[phone] (fax)  °  ° °Adopting a Healthy Lifestyle. ° °Know what a healthy weight is for you (roughly BMI <25) and aim to maintain this °  °Aim for 7+ servings of fruits and vegetables daily °  °65-80+ fluid ounces of water or unsweet tea for healthy kidneys °  °Limit to max 1 drink of alcohol per day; avoid smoking/tobacco °  °Limit animal fats in diet for cholesterol and heart health - choose grass fed whenever available °  °Avoid highly processed foods, and foods high in saturated/trans fats °  °Aim for low stress - take time to unwind and care for your mental health °  °Aim for 150 min of moderate intensity exercise weekly for heart health, and weights twice weekly for bone health °  °Aim for 7-9 hours of sleep daily °  °When it comes to diets, agreement about the perfect plan isn´t easy to find, even among the experts. °Experts at the [employer] developed an idea known as the Healthy Eating Plate. Just imagine a plate divided into logical, healthy portions. °  °The emphasis is on diet quality: °  °Load up on vegetables and fruits - one-half of your plate: Aim for color and variety, and remember that potatoes don´t count. °  °Go for whole grains - one-quarter of your plate: Whole wheat, barley, wheat berries, quinoa, oats, Cedeno rice, and foods made with them. If you want pasta, go with whole wheat pasta. °  °Protein power - one-quarter of your plate:   Fish, chicken, beans, and nuts are all healthy, versatile protein sources. Limit red meat. °  °The diet, however, does go beyond the plate, offering a few other suggestions. °  °Use healthy plant oils, such as olive, canola, soy, corn, sunflower and peanut. Check the labels, and avoid partially hydrogenated oil, which have unhealthy trans fats. °  °If you´re thirsty, drink water. Coffee  and tea are good in moderation, but skip sugary drinks and limit milk and dairy products to one or two daily servings. °  °The type of carbohydrate in the diet is more important than the amount. Some sources of carbohydrates, such as vegetables, fruits, whole grains, and beans-are healthier than others. °  °Finally, stay active ° °Signed, °Cameren Odwyer, DO  °07/29/2021 1:45 PM    °[facility] °

## 2021-07-29 NOTE — Telephone Encounter (Signed)
Pt was seen today.

## 2021-07-29 NOTE — H&P (View-Only) (Signed)
Cardiology Office Note:    Date:  07/29/2021   ID:  JEFFRIE STANDER, DOB 1985/11/25, MRN [id number]  PCP:  Lianne Moris, PA-C  Cardiologist:  Thomasene Ripple, DO  Electrophysiologist:  None   Referring MD: Lianne Moris, PA-C   " I am experiencing numbness and tingling and still feels very tired"   History of Present Illness:    Tonya [name] is a 36 y.o. female with a hx of hypertension, hyperlipidemia, obesity who was recently admitted in January 2023 at that time she presented because she was experiencing limb heaviness as well as dizziness and intermittent vision changes.  While admitted she was noted to have elevated blood pressure as well as CT of the head showed wedge-shaped hypodensity in the inferior aspect of the cerebellum on the right.  MRI revealed acute infarcts in the inferior right cerebellum, PICA territory.  There was also subacute infarct in the superior posterior right cerebellum, MCA territory.  There were no evidence of LVO stenosis or dissection on CTA.  Neurology was consulted because an echocardiogram which showed no evidence of depressed ejection fraction and LVEF was normal and there is no evidence of PFO and noted area of moderate apical RV hypokinesis but no other structural abnormality..  They had entertained the discussion of hypercoagulable work-up which was deferred to the outpatient setting.   She is here today to establish cardiac care.  She is posthospitalization from her CVA.  She says that she had been feeling a little better.  Over the last several days she has had feelings where she feels heaviness in her upper extremity as well as some numbness and tingling's.  She also does feel tired.  She notes that recently at work she has some dizziness.  She did not pass out.  She did wear the monitor for close to 2 weeks but did experience Significant rash and could not continue this.  She may have the monitor back.  Past Medical History:  Diagnosis Date    Cystitis    Occas   Dizziness    Frequent UTI    GERD (gastroesophageal reflux disease)    Hand tingling    And feet   Headache(784.0)    Hypertension    no meds, patient reports this only while pregnant, not currently   PONV (postoperative nausea and vomiting)    Rapid heart rate    during pregnancy only   Seasonal allergies     Past Surgical History:  Procedure Laterality Date   ABDOMINAL HYSTERECTOMY     ADENOIDECTOMY     as a child   BACK SURGERY  2012   BREAST LUMPECTOMY Left    Benign   LAPAROSCOPIC VAGINAL HYSTERECTOMY WITH SALPINGECTOMY Bilateral 03/18/2017   Procedure: LAPAROSCOPIC ASSISTED VAGINAL HYSTERECTOMY WITH SALPINGECTOMY;  Surgeon: Zelphia Cairo, MD;  Location: [facility];  Service: Gynecology;  Laterality: Bilateral;  NEEDS BED   LUMBAR LAMINECTOMY Left 01/25/2019   Procedure: LEFT LUMBAR FIVE TO SACRAL ONE, RECURRENT MICRODISCECTOMY;  Surgeon: Eldred Manges, MD;  Location: [facility] OR;  Service: Orthopedics;  Laterality: Left;   LUMBAR LAMINECTOMY/DECOMPRESSION MICRODISCECTOMY N/A 04/10/2019   Procedure: LEFT L5-S1 REDO MICRODISCECTOMY;  Surgeon: Kerrin Champagne, MD;  Location: [facility] OR;  Service: Orthopedics;  Laterality: N/A;   SHOULDER SURGERY  2008, 2009   L shoulder, acromion shave and recurrent dislocation.   SPINE SURGERY  03/06/11   lumbar disc L5   TUBAL LIGATION Bilateral 05/07/2014   Procedure: POST PARTUM TUBAL  LIGATION;  Surgeon: Zelphia Cairo, MD;  Location: [facility];  Service: Gynecology;  Laterality: Bilateral;   VAGINAL DELIVERY  2014, 2015   WISDOM TOOTH EXTRACTION      Current Medications: Current Meds  Medication Sig   aspirin EC 81 MG EC tablet Take 1 tablet (81 mg total) by mouth daily.   atorvastatin (LIPITOR) 80 MG tablet Take 1 tablet (80 mg total) by mouth daily.   hydrochlorothiazide (HYDRODIURIL) 25 MG tablet Take 1 tablet (25 mg total) by mouth daily.   Ibuprofen (ADVIL PO) Take 1 tablet by mouth every 6 (six) hours  as needed (headache).   losartan (COZAAR) 100 MG tablet Take 100 mg by mouth daily.     Allergies:   Green dyes and Ibuprofen   Social History   Socioeconomic History   Marital status: Married    Spouse name: Not on file   Number of children: Not on file   Years of education: Not on file   Highest education level: Not on file  Occupational History   Not on file  Tobacco Use   Smoking status: Former    Types: Cigarettes    Quit date: 2015    Years since quitting: 8.1   Smokeless tobacco: Never  Vaping Use   Vaping Use: Former   Quit date: 03/06/2020  Substance and Sexual Activity   Alcohol use: Yes    Alcohol/week: 0.0 standard drinks    Comment: occasional   Drug use: No   Sexual activity: Not Currently    Birth control/protection: None    Comment: Hysterectomy  Other Topics Concern   Not on file  Social History Narrative   Not on file   Social Determinants of Health   Financial Resource Strain: Not on file  Food Insecurity: Not on file  Transportation Needs: Not on file  Physical Activity: Not on file  Stress: Not on file  Social Connections: Not on file     Family History: The patient's family history includes Diabetes in her maternal grandfather and paternal uncle; Hyperlipidemia in her paternal grandfather; Hypertension in her maternal grandfather, maternal grandmother, paternal grandfather, and paternal grandmother; Obesity in an other family member; Sleep apnea in an other family member; Stroke in her maternal grandfather; Thrombophlebitis in her paternal grandfather.  ROS:   Review of Systems  Constitution: Negative for decreased appetite, fever and weight gain.  HENT: Negative for congestion, ear discharge, hoarse voice and sore throat.   Eyes: Negative for discharge, redness, vision loss in right eye and visual halos.  Cardiovascular: Negative for chest pain, dyspnea on exertion, leg swelling, orthopnea and palpitations.  Respiratory: Negative for  cough, hemoptysis, shortness of breath and snoring.   Endocrine: Negative for heat intolerance and polyphagia.  Hematologic/Lymphatic: Negative for bleeding problem. Does not bruise/bleed easily.  Skin: Negative for flushing, nail changes, rash and suspicious lesions.  Musculoskeletal: Negative for arthritis, joint pain, muscle cramps, myalgias, neck pain and stiffness.  Gastrointestinal: Negative for abdominal pain, bowel incontinence, diarrhea and excessive appetite.  Genitourinary: Negative for decreased libido, genital sores and incomplete emptying.  Neurological: Negative for brief paralysis, focal weakness, headaches and loss of balance.  Psychiatric/Behavioral: Negative for altered mental status, depression and suicidal ideas.  Allergic/Immunologic: Negative for HIV exposure and persistent infections.    EKGs/Labs/Other Studies Reviewed:    The following studies were reviewed today:   EKG:  None today   TTE 06/2021 IMPRESSIONS   1. Left ventricular ejection fraction, by estimation, is 60 to  65%. The left ventricle has normal function. The left ventricle has no regional  wall motion abnormalities. Left ventricular diastolic parameters were normal.   2. Area of lateral apical RV hypokinesis . Right ventricular systolic function is mildly reduced. The right ventricular size is mildly enlarged.   3. The mitral valve is normal in structure. No evidence of mitral valve regurgitation. No evidence of mitral stenosis.   4. The aortic valve is tricuspid. Aortic valve regurgitation is not visualized. No aortic stenosis is present.   5. The inferior vena cava is normal in size with greater than 50% respiratory variability, suggesting right atrial pressure of 3 mmHg.   6. Agitated saline contrast bubble study was negative, with no evidence of any interatrial shunt.   FINDINGS   Left Ventricle: Left ventricular ejection fraction, by estimation, is 60 to 65%. The left ventricle has normal  function. The left ventricle has no regional wall motion abnormalities. The left ventricular internal cavity size was normal in size. There is   no left ventricular hypertrophy. Left ventricular diastolic parameters were normal.   Right Ventricle: Area of lateral apical RV hypokinesis. The right ventricular size is mildly enlarged. No increase in right ventricular wall  thickness. Right ventricular systolic function is mildly reduced.   Left Atrium: Left atrial size was normal in size.   Right Atrium: Right atrial size was normal in size.   Pericardium: There is no evidence of pericardial effusion.   Mitral Valve: The mitral valve is normal in structure. No evidence of mitral valve regurgitation. No evidence of mitral valve stenosis.   Tricuspid Valve: The tricuspid valve is normal in structure. Tricuspid valve regurgitation is not demonstrated. No evidence of tricuspid  stenosis.   Aortic Valve: The aortic valve is tricuspid. Aortic valve regurgitation is not visualized. No aortic stenosis is present.   Pulmonic Valve: The pulmonic valve was normal in structure. Pulmonic valve regurgitation is not visualized. No evidence of pulmonic stenosis.   Aorta: The aortic root is normal in size and structure.   Venous: The inferior vena cava is normal in size with greater than 50% respiratory variability, suggesting right atrial pressure of 3 mmHg.   IAS/Shunts: No atrial level shunt detected by color flow Doppler. Agitated saline contrast was given intravenously to evaluate for intracardiac shunting. Agitated saline contrast bubble study was negative, with no evidence of any interatrial shunt.    Recent Labs: 06/13/2021: BUN 9; Creatinine, Ser 0.53; Hemoglobin 15.4; Platelets 295; Potassium 3.1; Sodium 132; TSH 1.918  Recent Lipid Panel    Component Value Date/Time   CHOL 251 (H) 06/13/2021 1458   TRIG 146 06/13/2021 1458   HDL 41 06/13/2021 1458   CHOLHDL 6.1 06/13/2021 1458   VLDL 29  06/13/2021 1458   LDLCALC 181 (H) 06/13/2021 1458   LDLDIRECT 141.9 01/06/2012 0922    Physical Exam:    VS:  BP 122/72    Pulse 89    Ht 5\' 6"  (1.676 m)    Wt 215 lb 12.8 oz (97.9 kg)    LMP 03/02/2017 (LMP Unknown)    SpO2 98%    BMI 34.83 kg/m     Wt Readings from Last 3 Encounters:  07/29/21 215 lb 12.8 oz (97.9 kg)  07/14/21 221 lb 8 oz (100.5 kg)  06/13/21 225 lb (102.1 kg)     GEN: Well nourished, well developed in no acute distress HEENT: Normal NECK: No JVD; No carotid bruits LYMPHATICS: No lymphadenopathy CARDIAC: S1S2 noted,RRR, no murmurs,  rubs, gallops RESPIRATORY:  Clear to auscultation without rales, wheezing or rhonchi  ABDOMEN: Soft, non-tender, non-distended, +bowel sounds, no guarding. EXTREMITIES: No edema, No cyanosis, no clubbing MUSCULOSKELETAL:  No deformity  SKIN: Warm and dry NEUROLOGIC:  Alert and oriented x 3, non-focal PSYCHIATRIC:  Normal affect, good insight  ASSESSMENT:    1. Cerebrovascular accident (CVA), unspecified mechanism (HCC)   2. Essential hypertension   3. Other hyperlipidemia   4. Dizziness   5. Medication management    PLAN:    Recent CVA and is status post hospitalization for this.  She has been getting back to her normal life.  She had an echocardiogram in the hospital.  She also wore a monitor which results are not out yet.  To understand what to make sure this is not cardioembolic we will get a TEE to assess for any LAA thrombus or any significant ductal abnormalities.  Her hypercoagulable panel has 1 abnormal there is an elevated value on her hypercoagulable panel.  She has not been evaluated by heme-onc will refer the patient as well.  Blood pressure is acceptable, continue with current antihypertensive regimen.  Hyperlipidemia - continue with current statin medication.  The patient is in agreement with the above plan. The patient left the office in stable condition.  The patient will follow up in 6 months or sooner if  needed.   Medication Adjustments/Labs and Tests Ordered: Current medicines are reviewed at length with the patient today.  Concerns regarding medicines are outlined above.  Orders Placed This Encounter  Procedures   VITAMIN D 25 Hydroxy (Vit-D Deficiency, Fractures)   B12   Basic Metabolic Panel (BMET)   CBC with Differential/Platelet   Magnesium   Ambulatory referral to Hematology / Oncology   No orders of the defined types were placed in this encounter.   Patient Instructions  Medication Instructions:  Your physician recommends that you continue on your current medications as directed. Please refer to the Current Medication list given to you today.  *If you need a refill on your cardiac medications before your next appointment, please call your pharmacy*   Lab Work: Your physician recommends that you return for lab work in:  TODAY: Vitamin D, Vitamin B12, BMET, Mag, CBC If you have labs (blood work) drawn today and your tests are completely normal, you will receive your results only by: MyChart Message (if you have MyChart) OR A paper copy in the mail If you have any lab test that is abnormal or we need to change your treatment, we will call you to review the results.   Testing/Procedures: Your physician has requested that you have a TEE. During a TEE, sound waves are used to create images of your heart. It provides your doctor with information about the size and shape of your heart and how well your hearts chambers and valves are working. In this test, a transducer is attached to the end of a flexible tube thats guided down your throat and into your esophagus (the tube leading from you mouth to your stomach) to get a more detailed image of your heart. You are not awake for the procedure. Please see the instruction sheet given to you today. For further information please visit [email or web address].  You are scheduled for a TEE on March 13.  [facility] at [facility]: [address] [place], [place] [address] at 10:00am. (1 hour prior to procedure unless lab work is needed; if  lab work is needed arrive 1.5 hours ahead)  DIET: Nothing to eat or drink after midnight except a sip of water with medications (see medication instructions below)  Medication Instructions:  Continue your anticoagulant You will need to continue your anticoagulant after your procedure until you are told by   your provider that it is safe to stop   Labs: YOU WILL HAVE YOUR LABS DRAWN ON TODAY.  You must have a responsible person to drive you home and stay in the waiting area during your procedure. Failure to do so could result in cancellation.  Bring your insurance cards.  *Special Note: Every effort is made to have your procedure done on time. Occasionally there are emergencies that occur at the hospital that may cause delays. Please be patient if a delay does occur.      Follow-Up: At [facility], you and your health needs are our priority.  As part of our continuing mission to provide you with exceptional heart care, we have created designated Provider Care Teams.  These Care Teams include your primary Cardiologist (physician) and Advanced Practice Providers (APPs -  Physician Assistants and Nurse Practitioners) who all work together to provide you with the care you need, when you need it.  We recommend signing up for the patient portal called "MyChart".  Sign up information is provided on this After Visit Summary.  MyChart is used to connect with patients for Virtual Visits (Telemedicine).  Patients are able to view lab/test results, encounter notes, upcoming appointments, etc.  Non-urgent messages can be sent to your provider as well.   To learn more about what you can do with MyChart, go to [email or web address].    Your next appointment:   6 month(s)  The format for your next appointment:   In Person  Provider:   Thomasene RippleKardie Graceland Wachter, DO      Other Instructions  [facility] [address] [place], [place] [address] [phone] (office) [phone] (fax)     Adopting a Healthy Lifestyle.  Know what a healthy weight is for you (roughly BMI <25) and aim to maintain this   Aim for 7+ servings of fruits and vegetables daily   65-80+ fluid ounces of water or unsweet tea for healthy kidneys   Limit to max 1 drink of alcohol per day; avoid smoking/tobacco   Limit animal fats in diet for cholesterol and heart health - choose grass fed whenever available   Avoid highly processed foods, and foods high in saturated/trans fats   Aim for low stress - take time to unwind and care for your mental health   Aim for 150 min of moderate intensity exercise weekly for heart health, and weights twice weekly for bone health   Aim for 7-9 hours of sleep daily   When it comes to diets, agreement about the perfect plan isnt easy to find, even among the experts. Experts at the [employer] developed an idea known as the Healthy Eating Plate. Just imagine a plate divided into logical, healthy portions.   The emphasis is on diet quality:   Load up on vegetables and fruits - one-half of your plate: Aim for color and variety, and remember that potatoes dont count.   Go for whole grains - one-quarter of your plate: Whole wheat, barley, wheat berries, quinoa, oats, [place] rice, and foods made with them. If you want pasta, go with whole wheat pasta.   Protein power - one-quarter of your plate:  Fish, chicken, beans, and nuts are all healthy, versatile protein sources. Limit red meat.   The diet, however, does go beyond the plate, offering a few other suggestions.   Use healthy plant oils, such as olive, canola, soy, corn, sunflower and peanut. Check the labels, and avoid partially hydrogenated oil, which have unhealthy trans fats.   If youre thirsty, drink water. Coffee  and tea are good in moderation, but skip sugary drinks and limit milk and dairy products to one or two daily servings.   The type of carbohydrate in the diet is more important than the amount. Some sources of carbohydrates, such as vegetables, fruits, whole grains, and beans-are healthier than others.   Finally, stay active  Signed, Thomasene Ripple, DO  07/29/2021 1:45 PM    [facility]

## 2021-07-30 LAB — CBC WITH DIFFERENTIAL/PLATELET
Basophils Absolute: 0.1 10*3/uL (ref 0.0–0.2)
Basos: 1 %
EOS (ABSOLUTE): 0.1 10*3/uL (ref 0.0–0.4)
Eos: 2 %
Hematocrit: 40.3 % (ref 34.0–46.6)
Hemoglobin: 13.6 g/dL (ref 11.1–15.9)
Immature Grans (Abs): 0 10*3/uL (ref 0.0–0.1)
Immature Granulocytes: 0 %
Lymphocytes Absolute: 2.2 10*3/uL (ref 0.7–3.1)
Lymphs: 31 %
MCH: 29.9 pg (ref 26.6–33.0)
MCHC: 33.7 g/dL (ref 31.5–35.7)
MCV: 89 fL (ref 79–97)
Monocytes Absolute: 0.4 10*3/uL (ref 0.1–0.9)
Monocytes: 6 %
Neutrophils Absolute: 4.3 10*3/uL (ref 1.4–7.0)
Neutrophils: 60 %
Platelets: 345 10*3/uL (ref 150–450)
RBC: 4.55 x10E6/uL (ref 3.77–5.28)
RDW: 12.7 % (ref 11.7–15.4)
WBC: 7.2 10*3/uL (ref 3.4–10.8)

## 2021-07-30 LAB — BASIC METABOLIC PANEL
BUN/Creatinine Ratio: 15 (ref 9–23)
BUN: 9 mg/dL (ref 6–20)
CO2: 26 mmol/L (ref 20–29)
Calcium: 9.3 mg/dL (ref 8.7–10.2)
Chloride: 101 mmol/L (ref 96–106)
Creatinine, Ser: 0.62 mg/dL (ref 0.57–1.00)
Glucose: 94 mg/dL (ref 70–99)
Potassium: 4 mmol/L (ref 3.5–5.2)
Sodium: 143 mmol/L (ref 134–144)
eGFR: 119 mL/min/{1.73_m2} (ref >59)

## 2021-07-30 LAB — VITAMIN D 25 HYDROXY (VIT D DEFICIENCY, FRACTURES): Vit D, 25-Hydroxy: 26 ng/mL — ABNORMAL LOW (ref 30.0–100.0)

## 2021-07-30 LAB — VITAMIN B12: Vitamin B-12: 422 pg/mL (ref 232–1245)

## 2021-07-30 LAB — MAGNESIUM: Magnesium: 2.2 mg/dL (ref 1.6–2.3)

## 2021-07-31 ENCOUNTER — Other Ambulatory Visit: Payer: Self-pay

## 2021-07-31 MED ORDER — VITAMIN D (ERGOCALCIFEROL) 1.25 MG (50000 UNIT) PO CAPS: 50000.0000 [IU] | ORAL_CAPSULE | ORAL | 0 refills | Status: DC

## 2021-07-31 NOTE — Progress Notes (Signed)
Prescription sent to pharmacy.

## 2021-08-05 ENCOUNTER — Other Ambulatory Visit: Payer: Self-pay

## 2021-08-05 ENCOUNTER — Ambulatory Visit (INDEPENDENT_AMBULATORY_CARE_PROVIDER_SITE_OTHER): Payer: PRIVATE HEALTH INSURANCE

## 2021-08-05 ENCOUNTER — Encounter: Payer: Self-pay | Admitting: Orthopedic Surgery

## 2021-08-05 ENCOUNTER — Ambulatory Visit (INDEPENDENT_AMBULATORY_CARE_PROVIDER_SITE_OTHER): Payer: PRIVATE HEALTH INSURANCE | Admitting: Orthopedic Surgery

## 2021-08-05 VITALS — BP 115/79 | HR 83 | Ht 66.0 in | Wt 221.4 lb

## 2021-08-05 DIAGNOSIS — M25531 Pain in right wrist: Secondary | ICD-10-CM

## 2021-08-05 MED ORDER — MELOXICAM 7.5 MG PO TABS: 7.5000 mg | ORAL_TABLET | Freq: Every day | ORAL | 0 refills | Status: AC

## 2021-08-05 NOTE — Progress Notes (Signed)
Office Visit Note   Patient: Tonya [name]           Date of Birth: 22-Apr-1986           MRN: [id number] Visit Date: 08/05/2021              Requested by: Tonya Moris, PA-C [address] [place],  [place] [address] PCP: Tonya Moris, PA-C   Assessment & Plan: Visit Diagnoses:  1. [name] in right wrist     Plan: Discussed with patient that she has some history exam findings that could be consistent with carpal or cubital tunnel syndrome.  She also has dorsal hand [name] and exam findings that could be consistent with extensor tendinitis.  She is only been having symptoms for 3 weeks so we will try conservative management with a wrist brace, particularly at night, and a strong oral anti-inflammatory medication.  I can see her back in 4 to 6 weeks if she still having difficulty.  At that point then we will consider an EMG/nerve conduction study.  Follow-Up Instructions: No follow-ups on file.   Orders:  Orders Placed This Encounter  Procedures   XR Wrist Complete Right   No orders of the defined types were placed in this encounter.     Procedures: No procedures performed   Clinical Data: No additional findings.   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  She describes numbness and tingling in all of her fingers but usually the middle finger, ring finger, and small finger as well as dorsal hand and wrist [name].  She denies any injury or possible inciting event 3 weeks ago.  The [name] is at the dorsal aspect of the hand and wrist and radiates proximally up the ulnar aspect of her forearm.  She also describes intermittent numbness and tingling in the previously mentioned fingers.  She has no numbness today.  She does wake 3-4 nights per week with numbness in all of her fingers.  She moves her fingers  and repositions her arm with some symptom relief.  She is wearing a wrist brace occasionally at work but does not wear it consistently and not wearing it at night.  She takes ibuprofen occasionally as needed for [name].   Review of Systems   Objective: Vital Signs: BP 115/79 (BP Location: Left Arm, Patient Position: Sitting, Cuff Size: Large)    Pulse 83    Ht 5\' 6"  (1.676 m)    Wt 221 lb 6.4 oz (100.4 kg)    LMP 03/02/2017 (LMP Unknown)    SpO2 99%    BMI 35.73 kg/m   Physical Exam Constitutional:      Appearance: Normal appearance.  Cardiovascular:     Rate and Rhythm: Normal rate.     Pulses: Normal pulses.  Pulmonary:     Effort: Pulmonary effort is normal.  Skin:    General: Skin is warm.     Capillary Refill: Capillary refill takes less than 2 seconds.  Neurological:     Mental Status: She is alert.    Right Hand Exam   Tenderness  Right hand tenderness location: Mildly TTP at dorsal aspect of hand and wrist w/ mild swelling.  Range of Motion  The patient has normal right wrist ROM.   Other  Erythema: absent Sensation: normal Pulse:  present  Comments:  Negative Tinel at wrist.  Positive Phalen sign w/ symptoms in the middle, ring, and small fingers.  Positive Tinel at elbow w/ sypmtoms into the middle and ring fingers.  Mild dorsal hand and wrist [name] w/ resisted finger extension.      Specialty Comments:  No specialty comments available.  Imaging: XR Wrist Complete Right  Result Date: 08/05/2021 Multiple views of the right wrist taken today are reviewed and interpreted by me.  They demonstrate no acute bony injury.  There is no evidence of malalignment.  There is no evidence of instability.  There are no notable degenerative changes present.     PMFS History: Patient Active Problem List   Diagnosis Date Noted   Other hyperlipidemia 07/29/2021   Dizziness 07/29/2021   Medication management 07/29/2021   Verruca vulgaris 07/14/2021   CVA (cerebral vascular  accident) (HCC) 06/13/2021   Fatigue and weakness  06/13/2021   Leukocytosis 06/13/2021   Hyponatremia 06/13/2021   Headache 05/07/2021   Lumbar stenosis 05/10/2020   S/P lumbar fusion 05/10/2020   Other intervertebral disc degeneration, lumbar region 03/19/2020   Protrusion of lumbar intervertebral disc 12/13/2018   Menorrhagia 03/18/2017   Left wrist [name] 04/23/2015   COMMON MIGRAINE 12/17/2006   Essential hypertension 12/17/2006   Past Medical History:  Diagnosis Date   Cystitis    Occas   Dizziness    Frequent UTI    GERD (gastroesophageal reflux disease)    Hand tingling    And feet   Headache(784.0)    Hypertension    no meds, patient reports this only while pregnant, not currently   PONV (postoperative nausea and vomiting)    Rapid heart rate    during pregnancy only   Seasonal allergies     Family History  Problem Relation Age of Onset   Diabetes Paternal Uncle    Hypertension Paternal Grandmother    Obesity Other    Sleep apnea Other    Hypertension Maternal Grandmother    Hypertension Maternal Grandfather    Diabetes Maternal Grandfather    Stroke Maternal Grandfather    Hypertension Paternal Grandfather    Thrombophlebitis Paternal Grandfather    Hyperlipidemia Paternal Grandfather     Past Surgical History:  Procedure Laterality Date   ABDOMINAL HYSTERECTOMY     ADENOIDECTOMY     as a child   BACK SURGERY  2012   BREAST LUMPECTOMY Left    Benign   LAPAROSCOPIC VAGINAL HYSTERECTOMY WITH SALPINGECTOMY Bilateral 03/18/2017   Procedure: LAPAROSCOPIC ASSISTED VAGINAL HYSTERECTOMY WITH SALPINGECTOMY;  Surgeon: Zelphia Cairo, MD;  Location: [facility];  Service: Gynecology;  Laterality: Bilateral;  NEEDS BED   LUMBAR LAMINECTOMY Left 01/25/2019   Procedure: LEFT LUMBAR FIVE TO SACRAL ONE, RECURRENT MICRODISCECTOMY;  Surgeon: Eldred Manges, MD;  Location: [facility] OR;  Service: Orthopedics;  Laterality: Left;   LUMBAR LAMINECTOMY/DECOMPRESSION  MICRODISCECTOMY N/A 04/10/2019   Procedure: LEFT L5-S1 REDO MICRODISCECTOMY;  Surgeon: Kerrin Champagne, MD;  Location: [facility] OR;  Service: Orthopedics;  Laterality: N/A;   SHOULDER SURGERY  2008, 2009   L shoulder, acromion shave and recurrent dislocation.   SPINE SURGERY  03/06/11   lumbar disc L5   TUBAL LIGATION Bilateral 05/07/2014   Procedure: POST PARTUM TUBAL LIGATION;  Surgeon: Zelphia Cairo, MD;  Location: [facility];  Service: Gynecology;  Laterality: Bilateral;   VAGINAL DELIVERY  2014, 2015   WISDOM TOOTH EXTRACTION     Social History   Occupational History  Not on file  Tobacco Use   Smoking status: Former    Types: Cigarettes    Quit date: 2015    Years since quitting: 8.1   Smokeless tobacco: Never  Vaping Use   Vaping Use: Former   Quit date: 03/06/2020  Substance and Sexual Activity   Alcohol use: Yes    Alcohol/week: 0.0 standard drinks    Comment: occasional   Drug use: No   Sexual activity: Not Currently    Birth control/protection: None    Comment: Hysterectomy

## 2021-08-08 ENCOUNTER — Encounter (HOSPITAL_COMMUNITY): Payer: Self-pay | Admitting: Cardiology

## 2021-08-14 ENCOUNTER — Other Ambulatory Visit: Payer: Self-pay

## 2021-08-14 ENCOUNTER — Inpatient Hospital Stay (HOSPITAL_COMMUNITY): Payer: PRIVATE HEALTH INSURANCE | Attending: Hematology | Admitting: Hematology

## 2021-08-14 VITALS — BP 124/85 | HR 85 | Temp 97.9°F | Resp 18 | Ht 66.14 in | Wt 221.1 lb

## 2021-08-14 DIAGNOSIS — Z7982 Long term (current) use of aspirin: Secondary | ICD-10-CM | POA: Insufficient documentation

## 2021-08-14 DIAGNOSIS — D6862 Lupus anticoagulant syndrome: Secondary | ICD-10-CM | POA: Insufficient documentation

## 2021-08-14 DIAGNOSIS — I639 Cerebral infarction, unspecified: Secondary | ICD-10-CM

## 2021-08-14 DIAGNOSIS — R760 Raised antibody titer: Secondary | ICD-10-CM | POA: Insufficient documentation

## 2021-08-14 NOTE — Progress Notes (Signed)
[facility], [place] [address]   CLINIC:  Medical Oncology/Hematology  Patient Care Team: Lanelle Bal, PA-C as PCP - General (Family Medicine) Berniece Salines, DO as PCP - Cardiology (Cardiology)  CHIEF COMPLAINTS/PURPOSE OF CONSULTATION:  Evaluation of abnormal coagulation panel and CVA  HISTORY OF PRESENTING ILLNESS:  Tonya [name] 36 y.o. female is here because of evaluation of coagulation panel and CVA, at the request of [facility].  Today she reports feeling well. 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 At that that time her main symptoms were fatigue, dizziness, and "floaters" in her eyes; she reports these symptoms have resolved and not recurred except for occasional fatigue and 1 episode of dizziness when walking down an escalator. She denies history of miscarriages, CVA, and PE. She underwent back surgery in December 2021. She denies skin rash with sun exposure and changing color of fingertips. She has a history of migraines and HTN. She denies history of DM. She reports recurrent UTI for which she has taken multiple courses of antibiotics. She denies fevers, weight loss, and drenching night sweats. She took Plavix for 1.5 months, and she is now taking aspirin 81 mg.   She works as a Engineer, production. She has 2 children. She previously vaped for 1.5 years before quitting following her CVA in January. Her maternal grandfather had a CVA in his 69s. Her paternal grandmother had bone and lung cancer, and her paternal great grandmother had breast cancer. She denies family history of miscarriages and lupus. Her mother has a history of migraines.   MEDICAL HISTORY:  Past Medical History:  Diagnosis Date   Cystitis    Occas   Dizziness    Frequent UTI    GERD (gastroesophageal reflux disease)    Hand tingling    And feet   Headache(784.0)    Hypertension    no meds, patient reports this only while pregnant, not currently   PONV (postoperative nausea and  vomiting)    Rapid heart rate    during pregnancy only   Seasonal allergies     SURGICAL HISTORY: Past Surgical History:  Procedure Laterality Date   ABDOMINAL HYSTERECTOMY     ADENOIDECTOMY     as a child   BACK SURGERY  2012   BREAST LUMPECTOMY Left    Benign   LAPAROSCOPIC VAGINAL HYSTERECTOMY WITH SALPINGECTOMY Bilateral 03/18/2017   Procedure: LAPAROSCOPIC ASSISTED VAGINAL HYSTERECTOMY WITH SALPINGECTOMY;  Surgeon: Marylynn Pearson, MD;  Location: [facility];  Service: Gynecology;  Laterality: Bilateral;  NEEDS BED   LUMBAR LAMINECTOMY Left 01/25/2019   Procedure: LEFT LUMBAR FIVE TO SACRAL ONE, RECURRENT MICRODISCECTOMY;  Surgeon: Marybelle Killings, MD;  Location: [facility];  Service: Orthopedics;  Laterality: Left;   LUMBAR LAMINECTOMY/DECOMPRESSION MICRODISCECTOMY N/A 04/10/2019   Procedure: LEFT L5-S1 REDO MICRODISCECTOMY;  Surgeon: Jessy Oto, MD;  Location: [facility];  Service: Orthopedics;  Laterality: N/A;   SHOULDER SURGERY  2008, 2009   L shoulder, acromion shave and recurrent dislocation.   SPINE SURGERY  03/06/11   lumbar disc L5   TUBAL LIGATION Bilateral 05/07/2014   Procedure: POST PARTUM TUBAL LIGATION;  Surgeon: Marylynn Pearson, MD;  Location: [facility];  Service: Gynecology;  Laterality: Bilateral;   VAGINAL DELIVERY  2014, 2015   WISDOM TOOTH EXTRACTION      SOCIAL HISTORY: Social History   Socioeconomic History   Marital status: Married    Spouse name: Not on file   Number of children:  Not on file   Years of education: Not on file   Highest education level: Not on file  Occupational History   Not on file  Tobacco Use   Smoking status: Former    Types: Cigarettes    Quit date: 2015    Years since quitting: 8.1   Smokeless tobacco: Never  Vaping Use   Vaping Use: Former   Quit date: 03/06/2020  Substance and Sexual Activity   Alcohol use: Yes    Alcohol/week: 0.0 standard drinks    Comment: occasional   Drug use: No   Sexual activity:  Not Currently    Birth control/protection: None    Comment: Hysterectomy  Other Topics Concern   Not on file  Social History Narrative   Not on file   Social Determinants of Health   Financial Resource Strain: Not on file  Food Insecurity: Not on file  Transportation Needs: Not on file  Physical Activity: Not on file  Stress: Not on file  Social Connections: Not on file  Intimate Partner Violence: Not on file    FAMILY HISTORY: Family History  Problem Relation Age of Onset   Diabetes Paternal Uncle    Hypertension Paternal Grandmother    Obesity Other    Sleep apnea Other    Hypertension Maternal Grandmother    Hypertension Maternal Grandfather    Diabetes Maternal Grandfather    Stroke Maternal Grandfather    Hypertension Paternal Grandfather    Thrombophlebitis Paternal Grandfather    Hyperlipidemia Paternal Grandfather     ALLERGIES:  is allergic to green dyes and ibuprofen.  MEDICATIONS:  Current Outpatient Medications  Medication Sig Dispense Refill   aspirin EC 81 MG EC tablet Take 1 tablet (81 mg total) by mouth daily. 30 tablet 0   atorvastatin (LIPITOR) 80 MG tablet Take 1 tablet (80 mg total) by mouth daily. 30 tablet 0   hydrochlorothiazide (HYDRODIURIL) 25 MG tablet Take 1 tablet (25 mg total) by mouth daily. 30 tablet 0   ibuprofen (ADVIL) 200 MG tablet Take 800 mg by mouth every 8 (eight) hours as needed (headache).     losartan (COZAAR) 100 MG tablet Take 100 mg by mouth daily.     meloxicam (MOBIC) 7.5 MG tablet Take 1 tablet (7.5 mg total) by mouth daily. 30 tablet 0   Vitamin D, Ergocalciferol, (DRISDOL) 1.25 MG (50000 UNIT) CAPS capsule Take 1 capsule (50,000 Units total) by mouth every 7 (seven) days. (Patient taking differently: Take 50,000 Units by mouth every Monday.) 12 capsule 0   No current facility-administered medications for this visit.    REVIEW OF SYSTEMS:   Review of Systems  Constitutional:  Positive for fatigue. Negative for  appetite change, fever and unexpected weight change.  Endocrine: Negative for hot flashes.  Skin:  Negative for rash.  Neurological:  Positive for dizziness (x1).  All other systems reviewed and are negative.   PHYSICAL EXAMINATION: ECOG PERFORMANCE STATUS: 0 - Asymptomatic  Vitals:   08/14/21 0812  BP: 124/85  Pulse: 85  Resp: 18  Temp: 97.9 F (36.6 C)  SpO2: 100%   Filed Weights   08/14/21 0812  Weight: 221 lb 1.9 oz (100.3 kg)   Physical Exam Vitals reviewed.  Constitutional:      Appearance: Normal appearance. She is obese.  Cardiovascular:     Rate and Rhythm: Normal rate and regular rhythm.     Pulses: Normal pulses.     Heart sounds: Normal heart sounds.  Pulmonary:  Effort: Pulmonary effort is normal.     Breath sounds: Normal breath sounds.  Abdominal:     Palpations: Abdomen is soft. There is no hepatomegaly, splenomegaly or mass.     Tenderness: There is no abdominal tenderness.  Lymphadenopathy:     Upper Body:     Right upper body: No supraclavicular or axillary adenopathy.     Left upper body: No supraclavicular or axillary adenopathy.     Lower Body: No right inguinal adenopathy. No left inguinal adenopathy.  Neurological:     General: No focal deficit present.     Mental Status: She is alert and oriented to person, place, and time.  Psychiatric:        Mood and Affect: Mood normal.        Behavior: Behavior normal.     LABORATORY DATA:  I have reviewed the data as listed Recent Results (from the past 2160 hour(s))  Basic metabolic panel     Status: Abnormal   Collection Time: 06/12/21  7:05 PM  Result Value Ref Range   Sodium 133 (L) 135 - 145 mmol/L   Potassium 4.6 3.5 - 5.1 mmol/L    Comment: SLIGHT HEMOLYSIS   Chloride 97 (L) 98 - 111 mmol/L   CO2 25 22 - 32 mmol/L   Glucose, Bld 100 (H) 70 - 99 mg/dL    Comment: Glucose reference range applies only to samples taken after fasting for at least 8 hours.   BUN 12 6 - 20 mg/dL    Creatinine, Ser 0.73 0.44 - 1.00 mg/dL   Calcium 9.3 8.9 - 10.3 mg/dL   GFR, Estimated >60 >60 mL/min    Comment: (NOTE) Calculated using the CKD-EPI Creatinine Equation (2021)    Anion gap 11 5 - 15    Comment: Performed at [facility] [address], [place], [place] [address]  Troponin I (High Sensitivity)     Status: None   Collection Time: 06/12/21  7:05 PM  Result Value Ref Range   Troponin I (High Sensitivity) 7 <18 ng/L    Comment: (NOTE) Elevated high sensitivity troponin I (hsTnI) values and significant  changes across serial measurements may suggest ACS but many other  chronic and acute conditions are known to elevate hsTnI results.  Refer to the Links section for chest pain algorithms and additional  guidance. Performed at [facility], [place] [address] [place], [place] [address]   CBC with Differential     Status: Abnormal   Collection Time: 06/12/21  7:05 PM  Result Value Ref Range   WBC 17.8 (H) 4.0 - 10.5 K/uL   RBC 5.25 (H) 3.87 - 5.11 MIL/uL   Hemoglobin 15.6 (H) 12.0 - 15.0 g/dL   HCT 47.1 (H) 36.0 - 46.0 %   MCV 89.7 80.0 - 100.0 fL   MCH 29.7 26.0 - 34.0 pg   MCHC 33.1 30.0 - 36.0 g/dL   RDW 12.0 11.5 - 15.5 %   Platelets 413 (H) 150 - 400 K/uL   nRBC 0.0 0.0 - 0.2 %   Neutrophils Relative % 68 %   Neutro Abs 12.2 (H) 1.7 - 7.7 K/uL   Lymphocytes Relative 24 %   Lymphs Abs 4.2 (H) 0.7 - 4.0 K/uL   Monocytes Relative 6 %   Monocytes Absolute 1.0 0.1 - 1.0 K/uL   Eosinophils Relative 1 %   Eosinophils Absolute 0.2 0.0 - 0.5 K/uL   Basophils Relative 1 %   Basophils Absolute  0.1 0.0 - 0.1 K/uL   Immature Granulocytes 0 %   Abs Immature Granulocytes 0.06 0.00 - 0.07 K/uL    Comment: Performed at [facility], [place] [address] [place], [place] [address]  Urinalysis, Routine w reflex microscopic Urine, Clean Catch     Status: Abnormal   Collection Time: 06/12/21  8:43 PM  Result Value Ref Range   Color, Urine STRAW (A) YELLOW    APPearance CLEAR CLEAR   Specific Gravity, Urine 1.006 1.005 - 1.030   pH 6.0 5.0 - 8.0   Glucose, UA NEGATIVE NEGATIVE mg/dL   Hgb urine dipstick SMALL (A) NEGATIVE   Bilirubin Urine NEGATIVE NEGATIVE   Ketones, ur NEGATIVE NEGATIVE mg/dL   Protein, ur NEGATIVE NEGATIVE mg/dL   Nitrite NEGATIVE NEGATIVE   Leukocytes,Ua NEGATIVE NEGATIVE   WBC, UA 0-5 0 - 5 WBC/hpf   Bacteria, UA NONE SEEN NONE SEEN   Squamous Epithelial / LPF 0-5 0 - 5   Mucus PRESENT     Comment: Performed at [facility], [place] [address] [place], [place] [address]  Troponin I (High Sensitivity)     Status: None   Collection Time: 06/12/21  9:46 PM  Result Value Ref Range   Troponin I (High Sensitivity) 5 <18 ng/L    Comment: (NOTE) Elevated high sensitivity troponin I (hsTnI) values and significant  changes across serial measurements may suggest ACS but many other  chronic and acute conditions are known to elevate hsTnI results.  Refer to the "Links" section for chest pain algorithms and additional  guidance. Performed at [facility], [place] [address] [place], [place] [address]   Resp Panel by RT-PCR (Flu A&B, Covid) Nasopharyngeal Swab     Status: None   Collection Time: 06/13/21  1:19 PM   Specimen: Nasopharyngeal Swab; Nasopharyngeal(NP) swabs in vial transport medium  Result Value Ref Range   SARS Coronavirus 2 by RT PCR NEGATIVE NEGATIVE    Comment: (NOTE) SARS-CoV-2 target nucleic acids are NOT DETECTED.  The SARS-CoV-2 RNA is generally detectable in upper respiratory specimens during the acute phase of infection. The lowest concentration of SARS-CoV-2 viral copies this assay can detect is 138 copies/mL. A negative result does not preclude SARS-Cov-2 infection and should not be used as the sole basis for treatment or other patient management decisions. A negative result may occur with  improper specimen collection/handling, submission of specimen other than nasopharyngeal swab, presence  of viral mutation(s) within the areas targeted by this assay, and inadequate number of viral copies(<138 copies/mL). A negative result must be combined with clinical observations, patient history, and epidemiological information. The expected result is Negative.  Fact Sheet for Patients:  EntrepreneurPulse.com.au  Fact Sheet for Healthcare Providers:  IncredibleEmployment.be  This test is no t yet approved or cleared by the [place] FDA and  has been authorized for detection and/or diagnosis of SARS-CoV-2 by FDA under an Emergency Use Authorization (EUA). This EUA will remain  in effect (meaning this test can be used) for the duration of the COVID-19 declaration under Section 564(b)(1) of the Act, 21 U.S.C.section 360bbb-3(b)(1), unless the authorization is terminated  or revoked sooner.       Influenza A by PCR NEGATIVE NEGATIVE   Influenza B by PCR NEGATIVE NEGATIVE    Comment: (NOTE) The Xpert Xpress SARS-CoV-2/FLU/RSV plus assay is intended as an aid in the diagnosis of influenza from Nasopharyngeal swab specimens and should not be used as a sole basis for  treatment. Nasal washings and aspirates are unacceptable for Xpert Xpress SARS-CoV-2/FLU/RSV testing.  Fact Sheet for Patients: EntrepreneurPulse.com.au  Fact Sheet for Healthcare Providers: IncredibleEmployment.be  This test is not yet approved or cleared by the [place] FDA and has been authorized for detection and/or diagnosis of SARS-CoV-2 by FDA under an Emergency Use Authorization (EUA). This EUA will remain in effect (meaning this test can be used) for the duration of the COVID-19 declaration under Section 564(b)(1) of the Act, 21 U.S.C. section 360bbb-3(b)(1), unless the authorization is terminated or revoked.  Performed at [facility], [place] [address] [place], [place] [address]   Urine rapid drug screen (hosp  performed)     Status: None   Collection Time: 06/13/21  2:00 PM  Result Value Ref Range   Opiates NONE DETECTED NONE DETECTED   Cocaine NONE DETECTED NONE DETECTED   Benzodiazepines NONE DETECTED NONE DETECTED   Amphetamines NONE DETECTED NONE DETECTED   Tetrahydrocannabinol NONE DETECTED NONE DETECTED   Barbiturates NONE DETECTED NONE DETECTED    Comment: (NOTE) DRUG SCREEN FOR MEDICAL PURPOSES ONLY.  IF CONFIRMATION IS NEEDED FOR ANY PURPOSE, NOTIFY LAB WITHIN 5 DAYS.  LOWEST DETECTABLE LIMITS FOR URINE DRUG SCREEN Drug Class                     Cutoff (ng/mL) Amphetamine and metabolites    1000 Barbiturate and metabolites    200 Benzodiazepine                 956 Tricyclics and metabolites     300 Opiates and metabolites        300 Cocaine and metabolites        300 THC                            50 Performed at [facility], [place] [address], [place], [place] [address]   Lipid panel     Status: Abnormal   Collection Time: 06/13/21  2:58 PM  Result Value Ref Range   Cholesterol 251 (H) 0 - 200 mg/dL   Triglycerides 146 <150 mg/dL   HDL 41 >40 mg/dL   Total CHOL/HDL Ratio 6.1 RATIO   VLDL 29 0 - 40 mg/dL   LDL Cholesterol 181 (H) 0 - 99 mg/dL    Comment:        Total Cholesterol/HDL:CHD Risk Coronary Heart Disease Risk Table                     Men   Women  1/2 Average Risk   3.4   3.3  Average Risk       5.0   4.4  2 X Average Risk   9.6   7.1  3 X Average Risk  23.4   11.0        Use the calculated Patient Ratio above and the CHD Risk Table to determine the patient's CHD Risk.        ATP III CLASSIFICATION (LDL):  <100     mg/dL   Optimal  100-129  mg/dL   Near or Above                    Optimal  130-159  mg/dL   Borderline  160-189  mg/dL   High  >190     mg/dL   Very High Performed at [facility]  [address], [place], [place] [address]   HIV Antibody (routine testing w rflx)     Status: None   Collection Time: 06/13/21  2:58 PM   Result Value Ref Range   HIV Screen 4th Generation wRfx Non Reactive Non Reactive    Comment: Performed at [facility], [place] [address] [place], [place] [address]  TSH     Status: None   Collection Time: 06/13/21  2:58 PM  Result Value Ref Range   TSH 1.918 0.350 - 4.500 uIU/mL    Comment: Performed by a 3rd Generation assay with a functional sensitivity of <=0.01 uIU/mL. Performed at [facility], [place] [address] [place], [place] [address]   Vitamin B12     Status: None   Collection Time: 06/13/21  2:58 PM  Result Value Ref Range   Vitamin B-12 272 180 - 914 pg/mL    Comment: (NOTE) This assay is not validated for testing neonatal or myeloproliferative syndrome specimens for Vitamin B12 levels. Performed at [facility], [place] [address] [place], [place] [address]   ANA w/Reflex if Positive     Status: None   Collection Time: 06/13/21  2:58 PM  Result Value Ref Range   Anti Nuclear Antibody (ANA) Negative Negative    Comment: (NOTE) Performed At: [facility] [place], [place] [id number] Rush Farmer MD [id number]   CBC     Status: Abnormal   Collection Time: 06/13/21  2:58 PM  Result Value Ref Range   WBC 13.5 (H) 4.0 - 10.5 K/uL   RBC 5.23 (H) 3.87 - 5.11 MIL/uL   Hemoglobin 15.4 (H) 12.0 - 15.0 g/dL   HCT 47.4 (H) 36.0 - 46.0 %   MCV 90.6 80.0 - 100.0 fL   MCH 29.4 26.0 - 34.0 pg   MCHC 32.5 30.0 - 36.0 g/dL   RDW 12.0 11.5 - 15.5 %   Platelets 295 150 - 400 K/uL   nRBC 0.0 0.0 - 0.2 %    Comment: Performed at [facility], [place] [address], [place], [place] [address]  Basic metabolic panel     Status: Abnormal   Collection Time: 06/13/21  2:58 PM  Result Value Ref Range   Sodium 132 (L) 135 - 145 mmol/L   Potassium 3.1 (L) 3.5 - 5.1 mmol/L   Chloride 101 98 - 111 mmol/L   CO2 18 (L) 22 - 32 mmol/L   Glucose, Bld 91 70 - 99 mg/dL    Comment: Glucose reference range applies only to samples taken after fasting for  at least 8 hours.   BUN 9 6 - 20 mg/dL   Creatinine, Ser 0.53 0.44 - 1.00 mg/dL   Calcium 8.3 (L) 8.9 - 10.3 mg/dL   GFR, Estimated >60 >60 mL/min    Comment: (NOTE) Calculated using the CKD-EPI Creatinine Equation (2021)    Anion gap 13 5 - 15    Comment: Performed at [facility] [address], [place], [place] [address]  ECHOCARDIOGRAM COMPLETE BUBBLE STUDY     Status: None   Collection Time: 06/13/21  3:54 PM  Result Value Ref Range   S' Lateral 2.50 cm   Area-P 1/2 3.99 cm2  Hemoglobin A1c     Status: None   Collection Time: 06/14/21  2:34 AM  Result Value Ref Range   Hgb A1c MFr Bld 5.0 4.8 - 5.6 %    Comment: (NOTE) Pre diabetes:  5.7%-6.4%  Diabetes:              >6.4%  Glycemic control for   <7.0% adults with diabetes    Mean Plasma Glucose 96.8 mg/dL    Comment: Performed at [facility] [address], [place], [place] [address]  Lupus anticoagulant panel     Status: Abnormal   Collection Time: 06/14/21  2:34 AM  Result Value Ref Range   PTT Lupus Anticoagulant 39.4 0.0 - 51.9 sec   DRVVT 47.6 (H) 0.0 - 47.0 sec   Lupus Anticoag Interp Comment:     Comment: (NOTE) 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 As only persistent lupus anticoagulant (LA) positivity meets laboratory diagnostic criteria for antiphospholipid syndrome, repeat testing in 12 or more weeks is recommended, ideally in the absence of anticoagulant therapy. Important Note: The results of LA testing are not valid for patients receiving heparin, direct Xa inhibitor (e.g., rivaroxaban, apixaban) or direct thrombin inhibitor (e.g., dabigatran) therapy. These drugs may cause false positive LA results but will not interfere with anticardiolipin and beta-2 glycoprotein 1 antibody testing. Performed At: [facility] [place], [place] [id number] Rush Farmer MD [id number]   Beta-2-glycoprotein i abs, IgG/M/A     Status:  None   Collection Time: 06/14/21  2:34 AM  Result Value Ref Range   Beta-2 Glyco I IgG <9 0 - 20 GPI IgG units    Comment: (NOTE) The reference interval reflects a 3SD or 99th percentile interval, which is thought to represent a potentially clinically significant result in accordance with the International Consensus Statement on the classification criteria for definitive antiphospholipid syndrome (APS). J Thromb Haem 2006;4:295-306.    Beta-2-Glycoprotein I IgM <9 0 - 32 GPI IgM units    Comment: (NOTE) The reference interval reflects a 3SD or 99th percentile interval, which is thought to represent a potentially clinically significant result in accordance with the International Consensus Statement on the classification criteria for definitive antiphospholipid syndrome (APS). J Thromb Haem 2006;4:295-306. Performed At: [facility] [place], [place] [id number] Rush Farmer MD [id number]    Beta-2-Glycoprotein I IgA <9 0 - 25 GPI IgA units    Comment: (NOTE) The reference interval reflects a 3SD or 99th percentile interval, which is thought to represent a potentially clinically significant result in accordance with the International Consensus Statement on the classification criteria for definitive antiphospholipid syndrome (APS). J Thromb Haem 2006;4:295-306.   Homocysteine, serum     Status: None   Collection Time: 06/14/21  2:34 AM  Result Value Ref Range   Homocysteine 7.2 0.0 - 14.5 umol/L    Comment: (NOTE) Performed At: [facility] [place], [place] [id number] Rush Farmer MD [id number]   Cardiolipin antibodies, IgG, IgM, IgA     Status: None   Collection Time: 06/14/21  2:34 AM  Result Value Ref Range   Anticardiolipin IgG <9 0 - 14 GPL U/mL    Comment: (NOTE)                          Negative:              <15                          Indeterminate:     15 - 20  Low-Med Positive: >20 -  80                          High Positive:         >80    Anticardiolipin IgM <9 0 - 12 MPL U/mL    Comment: (NOTE)                          Negative:              <13                          Indeterminate:     13 - 20                          Low-Med Positive: >20 - 80                          High Positive:         >80    Anticardiolipin IgA <9 0 - 11 APL U/mL    Comment: (NOTE)                          Negative:              <12                          Indeterminate:     12 - 20                          Low-Med Positive: >20 - 80                          High Positive:         >80 Performed At: [facility] [place], [place] [id number] Rush Farmer MD [id number]   Phosphatidylserine antibodies     Status: None   Collection Time: 06/14/21  2:34 AM  Result Value Ref Range   Phosphatydalserine, IgG <9 0 - 30 Units    Comment: (NOTE) Performed At: [facility] [place], [place] [id number] Rush Farmer MD [id number]    Phosphatydalserine, IgM <10 0 - 30 Units   Phosphatydalserine, IgA <1 0 - 19 APS Units  RPR     Status: None   Collection Time: 06/14/21  2:34 AM  Result Value Ref Range   RPR Ser Ql NON REACTIVE NON REACTIVE    Comment: Performed at [facility], [address] [place], [place] [address]  dRVVT Mix     Status: Abnormal   Collection Time: 06/14/21  2:34 AM  Result Value Ref Range   dRVVT Mix 43.6 (H) 0.0 - 40.4 sec    Comment: (NOTE) Performed At: [facility] [place], [place] [id number] Rush Farmer MD [id number]   dRVVT Confirm     Status: Abnormal   Collection Time: 06/14/21  2:34 AM  Result Value Ref Range   dRVVT Confirm 1.3 (H) 0.8 - 1.2 ratio    Comment: (NOTE) Performed At: [facility] [place], [place] [id number] Rush Farmer MD [id number]   VITAMIN D 25 Hydroxy (Vit-D Deficiency, Fractures)  Status: Abnormal    Collection Time: 07/29/21 10:41 AM  Result Value Ref Range   Vit D, 25-Hydroxy 26.0 (L) 30.0 - 100.0 ng/mL    Comment: Vitamin D deficiency has been defined by the [place] practice guideline as a level of serum 25-OH vitamin D less than 20 ng/mL (1,2). The Endocrine Society went on to further define vitamin D insufficiency as a level between 21 and 29 ng/mL (2). 1. IOM (Institute of Medicine). 2010. Dietary reference    intakes for calcium and D. [place]: The    [employer]. 2. Holick MF, Binkley [place], Bischoff-Ferrari HA, et al.    Evaluation, treatment, and prevention of vitamin D    deficiency: an Endocrine Society clinical practice    guideline. JCEM. 2011 Jul; 96(7):1911-30.   B12     Status: None   Collection Time: 07/29/21 10:41 AM  Result Value Ref Range   Vitamin B-12 422 232 - 1,245 pg/mL  Basic Metabolic Panel (BMET)     Status: None   Collection Time: 07/29/21 10:41 AM  Result Value Ref Range   Glucose 94 70 - 99 mg/dL   BUN 9 6 - 20 mg/dL   Creatinine, Ser 0.62 0.57 - 1.00 mg/dL   eGFR 119 >59 mL/min/1.73   BUN/Creatinine Ratio 15 9 - 23   Sodium 143 134 - 144 mmol/L   Potassium 4.0 3.5 - 5.2 mmol/L   Chloride 101 96 - 106 mmol/L   CO2 26 20 - 29 mmol/L   Calcium 9.3 8.7 - 10.2 mg/dL  CBC with Differential/Platelet     Status: None   Collection Time: 07/29/21 10:41 AM  Result Value Ref Range   WBC 7.2 3.4 - 10.8 x10E3/uL   RBC 4.55 3.77 - 5.28 x10E6/uL   Hemoglobin 13.6 11.1 - 15.9 g/dL   Hematocrit 40.3 34.0 - 46.6 %   MCV 89 79 - 97 fL   MCH 29.9 26.6 - 33.0 pg   MCHC 33.7 31.5 - 35.7 g/dL   RDW 12.7 11.7 - 15.4 %   Platelets 345 150 - 450 x10E3/uL   Neutrophils 60 Not Estab. %   Lymphs 31 Not Estab. %   Monocytes 6 Not Estab. %   Eos 2 Not Estab. %   Basos 1 Not Estab. %   Neutrophils Absolute 4.3 1.4 - 7.0 x10E3/uL   Lymphocytes Absolute 2.2 0.7 - 3.1 x10E3/uL   Monocytes Absolute 0.4 0.1 -  0.9 x10E3/uL   EOS (ABSOLUTE) 0.1 0.0 - 0.4 x10E3/uL   Basophils Absolute 0.1 0.0 - 0.2 x10E3/uL   Immature Granulocytes 0 Not Estab. %   Immature Grans (Abs) 0.0 0.0 - 0.1 x10E3/uL  Magnesium     Status: None   Collection Time: 07/29/21 10:41 AM  Result Value Ref Range   Magnesium 2.2 1.6 - 2.3 mg/dL    RADIOGRAPHIC STUDIES: I have personally reviewed the radiological images as listed and agreed with the findings in the report. CARDIAC EVENT MONITOR  Result Date: 08/04/2021 The patient wore the monitor for 13 days starting 06/26/2021. Indication: History of CVA The minimum heart rate was 59 bpm, maximum heart rate was 109  bpm, and average heart rate was 138  bpm. Predominant underlying rhythm was Sinus Rhythm. Premature atrial complexes were rare Premature Ventricular complexes were rare. No pauses, No AV block and no atrial fibrillation present. No  patient triggered events noted Conclusion: Normal/unremarkable study no evidence of significant arrhythmia.  XR Wrist Complete Right  Result Date: 08/05/2021 Multiple views of the right wrist taken today are reviewed and interpreted by me.  They demonstrate no acute bony injury.  There is no evidence of malalignment.  There is no evidence of instability.  There are no notable degenerative changes present.    ASSESSMENT:  Positive lupus anticoagulant: - Patient seen at the request of Dr. Harriet Masson for a positive lupus anticoagulant in the setting of cryptogenic stroke. 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 - CT angiogram: No evidence of dissection.  No intracranial large vessel occlusion or significant stenosis. 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  Anticardiolipin and antibeta 2 glycoprotein 1 antibodies  negative. - No prior history of DVT/PE.  No prior history of miscarriages. - She was treated with 3 weeks of Plavix after the stroke.  She is currently on aspirin.   Social/family history: - She works as a Engineer, production.  She was vaping for the last 1 and half year and quit in January at the time of stroke. - Maternal grandfather had stroke and diabetes.  Paternal grandmother had lung cancer and bone cancer.  Paternal great grandmother had breast cancer.  No family history of lupus or lupus anticoagulant.   PLAN:  Positive lupus anticoagulant: - She is scheduled to have TEE on 08/18/2021. - Would recommend repeating lupus anticoagulant panel in 6 weeks. - RTC to discuss results.   All questions were answered. The patient knows to call the clinic with any problems, questions or concerns.  Derek Jack, MD 08/14/21 8:49 AM  [place] [phone]   I, Thana Ates, am acting as a scribe for Dr. Derek Jack.  I, Derek Jack MD, have reviewed the above documentation for accuracy and completeness, and I agree with the above.

## 2021-08-14 NOTE — Patient Instructions (Addendum)
[place] at [facility] ?Discharge Instructions ? ? ?You were seen and examined today by Dr. Delton Coombes. ? ?We will repeat some of your lab work and see you back in 6 weeks.  ? ? ?Thank you for choosing [place] at [facility] to provide your oncology and hematology care.  To afford each patient quality time with our provider, please arrive at least 15 minutes before your scheduled appointment time.  ? ?If you have a lab appointment with the [facility] please come in thru the Main Entrance and check in at the main information desk. ? ?You need to re-schedule your appointment should you arrive 10 or more minutes late.  We strive to give you quality time with our providers, and arriving late affects you and other patients whose appointments are after yours.  Also, if you no show three or more times for appointments you may be dismissed from the clinic at the providers discretion.     ?Again, thank you for choosing [facility].  Our hope is that these requests will decrease the amount of time that you wait before being seen by our physicians.       ?_____________________________________________________________ ? ?Should you have questions after your visit to [facility], please contact our office at [phone] and follow the prompts.  Our office hours are 8:00 a.m. and 4:30 p.m. Monday - Friday.  Please note that voicemails left after 4:00 p.m. may not be returned until the following business day.  We are closed weekends and major holidays.  You do have access to a nurse 24-7, just call the main number to the clinic [phone] and do not press any options, hold on the line and a nurse will answer the phone.   ? ?For prescription refill requests, have your pharmacy contact our office and allow 72 hours.   ? ?Due to Covid, you will need to wear a mask upon entering the hospital. If you do not have a mask, a mask will be given to you at  the Main Entrance upon arrival. For doctor visits, patients may have 1 support person age 36 or older with them. For treatment visits, patients can not have anyone with them due to social distancing guidelines and our immunocompromised population.  ? ?   ?

## 2021-08-15 NOTE — Addendum Note (Signed)
Addended by: Thomasene Ripple on: 08/15/2021 02:09 PM   Modules accepted: Orders

## 2021-08-18 ENCOUNTER — Encounter (HOSPITAL_COMMUNITY)
Admission: RE | Disposition: A | Discharge status: Home / Self Care | Payer: Self-pay | Source: Home / Self Care | Attending: Cardiology

## 2021-08-18 ENCOUNTER — Ambulatory Visit (HOSPITAL_BASED_OUTPATIENT_CLINIC_OR_DEPARTMENT_OTHER)
Admission: RE | Admit: 2021-08-18 | Discharge: 2021-08-18 | Disposition: A | Discharge status: Home / Self Care | Payer: PRIVATE HEALTH INSURANCE | Source: Ambulatory Visit | Attending: Cardiology | Admitting: Cardiology

## 2021-08-18 ENCOUNTER — Encounter: Payer: Self-pay | Admitting: Cardiology

## 2021-08-18 ENCOUNTER — Ambulatory Visit (HOSPITAL_COMMUNITY)
Admission: RE | Admit: 2021-08-18 | Discharge: 2021-08-18 | Disposition: A | Discharge status: Home / Self Care | Payer: PRIVATE HEALTH INSURANCE | Attending: Cardiology | Admitting: Cardiology

## 2021-08-18 ENCOUNTER — Ambulatory Visit (HOSPITAL_BASED_OUTPATIENT_CLINIC_OR_DEPARTMENT_OTHER): Payer: PRIVATE HEALTH INSURANCE | Admitting: Anesthesiology

## 2021-08-18 ENCOUNTER — Encounter (HOSPITAL_COMMUNITY): Payer: Self-pay | Admitting: Cardiology

## 2021-08-18 ENCOUNTER — Ambulatory Visit (HOSPITAL_COMMUNITY): Payer: PRIVATE HEALTH INSURANCE | Admitting: Anesthesiology

## 2021-08-18 ENCOUNTER — Other Ambulatory Visit: Payer: Self-pay

## 2021-08-18 DIAGNOSIS — E669 Obesity, unspecified: Secondary | ICD-10-CM | POA: Diagnosis not present

## 2021-08-18 DIAGNOSIS — I639 Cerebral infarction, unspecified: Secondary | ICD-10-CM | POA: Insufficient documentation

## 2021-08-18 DIAGNOSIS — Z6834 Body mass index (BMI) 34.0-34.9, adult: Secondary | ICD-10-CM | POA: Insufficient documentation

## 2021-08-18 DIAGNOSIS — I10 Essential (primary) hypertension: Secondary | ICD-10-CM | POA: Insufficient documentation

## 2021-08-18 DIAGNOSIS — Z79899 Other long term (current) drug therapy: Secondary | ICD-10-CM | POA: Diagnosis not present

## 2021-08-18 DIAGNOSIS — M1990 Unspecified osteoarthritis, unspecified site: Secondary | ICD-10-CM

## 2021-08-18 DIAGNOSIS — Z87891 Personal history of nicotine dependence: Secondary | ICD-10-CM | POA: Diagnosis not present

## 2021-08-18 DIAGNOSIS — E7849 Other hyperlipidemia: Secondary | ICD-10-CM | POA: Insufficient documentation

## 2021-08-18 DIAGNOSIS — Z7982 Long term (current) use of aspirin: Secondary | ICD-10-CM | POA: Diagnosis not present

## 2021-08-18 SURGERY — ECHOCARDIOGRAM, TRANSESOPHAGEAL
Anesthesia: Monitor Anesthesia Care

## 2021-08-18 MED ORDER — PROPOFOL 500 MG/50ML IV EMUL
INTRAVENOUS | Status: DC | PRN
  Administered 2021-08-18: 125 ug/kg/min via INTRAVENOUS

## 2021-08-18 MED ORDER — SODIUM CHLORIDE 0.9 % IV SOLN: INTRAVENOUS | Status: DC | PRN

## 2021-08-18 NOTE — Anesthesia Procedure Notes (Signed)
Procedure Name: [place] ?Date/Time: 08/18/2021 9:40 AM ?Performed by: Griffin Dakin, CRNA ?Pre-anesthesia Checklist: Patient identified, Emergency Drugs available, Suction available, Patient being monitored and Timeout performed ?Patient Re-evaluated:Patient Re-evaluated prior to induction ?Oxygen Delivery Method: Nasal cannula ?Induction Type: IV induction ?Placement Confirmation: positive ETCO2 and breath sounds checked- equal and bilateral ?Dental Injury: Teeth and Oropharynx as per pre-operative assessment  ? ? ? ? ?

## 2021-08-18 NOTE — Transfer of Care (Signed)
Immediate Anesthesia Transfer of Care Note ? ?Patient: Tonya [name] ? ?Procedure(s) Performed: TRANSESOPHAGEAL ECHOCARDIOGRAM (TEE) ?BUBBLE STUDY ? ?Patient Location: PACU ? ?Anesthesia Type:MAC ? ?Level of Consciousness: awake, alert  and oriented ? ?Airway & Oxygen Therapy: Patient Spontanous Breathing ? ?Post-op Assessment: Report given to RN and Post -op Vital signs reviewed and stable ? ?Post vital signs: Reviewed and stable ? ?Last Vitals:  ?Vitals Value Taken Time  ?BP 122/79 08/18/21 1004  ?Temp    ?Pulse 80 08/18/21 1005  ?Resp 16 08/18/21 1005  ?SpO2 100 % 08/18/21 1005  ?Vitals shown include unvalidated device data. ? ?Last Pain:  ?Vitals:  ? 08/18/21 0909  ?TempSrc: Oral  ?PainSc: 0-No pain  ?   ? ?  ? ?Complications: No notable events documented. ?

## 2021-08-18 NOTE — CV Procedure (Signed)
? ? ? ?  TRANSESOPHAGEAL ECHOCARDIOGRAM  ? ?NAME:  Tonya [name]   MRN: [id number] ?DOB:  05-Feb-1986   ADMIT DATE: 08/18/2021 ? ?INDICATIONS: ?CVA ? ?PROCEDURE:  ? ?Informed consent was obtained prior to the procedure. The risks, benefits and alternatives for the procedure were discussed and the patient comprehended these risks.  Risks include, but are not limited to, cough, sore throat, vomiting, nausea, somnolence, esophageal and stomach trauma or perforation, bleeding, low blood pressure, aspiration, pneumonia, infection, trauma to the teeth and death.   ? ?After a procedural time-out, the oropharynx was anesthetized and the patient was sedated by the anesthesia service. The transesophageal probe was inserted in the esophagus and stomach without difficulty and multiple views were obtained. Anesthesia was monitored by Huel Cote, CRNA.  ? ? ?COMPLICATIONS:   ? ?There were no immediate complications. ? ?FINDINGS: ? ?Positive bubble study suggesting PFO ? ? ?Epifanio Lesches MD ?[facility]  ?[address], [address] ?[place], [place] [address] ?[phone]  ? ?11:41 AM ? ? ?

## 2021-08-18 NOTE — Progress Notes (Signed)
?  Echocardiogram ?Echocardiogram Transesophageal has been performed. ? ?Sheralyn Boatman R ?08/18/2021, 10:12 AM ?

## 2021-08-18 NOTE — Anesthesia Preprocedure Evaluation (Addendum)
Anesthesia Evaluation  ?Patient identified by MRN, date of birth, ID band ?Patient awake ? ? ? ?Reviewed: ?Allergy & Precautions, NPO status , Patient's Chart, lab work & pertinent test results ? ?History of Anesthesia Complications ?(+) PONV and history of anesthetic complications ? ?Airway ?Mallampati: II ? ?TM Distance: >3 FB ?Neck ROM: Full ? ? ? Dental ?no notable dental hx. ?(+) Dental Advisory Given ?  ?Pulmonary ?neg pulmonary ROS, former smoker,  ?  ?Pulmonary exam normal ?breath sounds clear to auscultation ? ? ? ? ? ? Cardiovascular ?hypertension, Pt. on medications ?Normal cardiovascular exam ?Rhythm:Regular Rate:Normal ? ?IMPRESSIONS  ? ? ??1. Left ventricular ejection fraction, by estimation, is 60 to 65%. The  ?left ventricle has normal function. The left ventricle has no regional  ?wall motion abnormalities. Left ventricular diastolic parameters were  ?normal.  ??2. Area of lateral apical RV hypokinesis . Right ventricular systolic  ?function is mildly reduced. The right ventricular size is mildly enlarged.  ??3. The mitral valve is normal in structure. No evidence of mitral valve  ?regurgitation. No evidence of mitral stenosis.  ??4. The aortic valve is tricuspid. Aortic valve regurgitation is not  ?visualized. No aortic stenosis is present.  ??5. The inferior vena cava is normal in size with greater than 50%  ?respiratory variability, suggesting right atrial pressure of 3 mmHg.  ??6. Agitated saline contrast bubble study was negative, with no evidence  ?of any interatrial shunt.  ? ? ?  ?Neuro/Psych ? Headaches, CVA negative psych ROS  ? GI/Hepatic ?Neg liver ROS, GERD  Medicated and Controlled,  ?Endo/Other  ?negative endocrine ROS ? Renal/GU ?negative Renal ROS  ?negative genitourinary ?  ?Musculoskeletal ? ?(+) Arthritis , Osteoarthritis,   ? Abdominal ?(+) + obese,   ?Peds ?negative pediatric ROS ?(+)  Hematology ?negative hematology ROS ?(+)   ?Anesthesia  Other Findings ?Day of surgery medications reviewed with the patient. ? Reproductive/Obstetrics ?negative OB ROS ? ?  ? ? ? ? ? ? ? ? ? ? ? ? ? ?  ?  ? ? ? ? ? ? ? ?Anesthesia Physical ? ?Anesthesia Plan ? ?ASA: 3 ? ?Anesthesia Plan: MAC  ? ?Post-op Pain Management: Minimal or no pain anticipated  ? ?Induction: Intravenous ? ?PONV Risk Score and Plan: 3 and Ondansetron, Midazolam, Propofol infusion and TIVA ? ?Airway Management Planned: Natural Airway ? ?Additional Equipment: None ? ?Intra-op Plan:  ? ?Post-operative Plan:  ? ?Informed Consent: I have reviewed the patients History and Physical, chart, labs and discussed the procedure including the risks, benefits and alternatives for the proposed anesthesia with the patient or authorized representative who has indicated his/her understanding and acceptance.  ? ? ? ?Dental advisory given ? ?Plan Discussed with: Anesthesiologist ? ?Anesthesia Plan Comments:   ? ? ? ? ? ?Anesthesia Quick Evaluation ? ?

## 2021-08-18 NOTE — Interval H&P Note (Signed)
History and Physical Interval Note: ? ?08/18/2021 ?9:33 AM ? ?Tonya [name]  has presented today for surgery, with the diagnosis of RECENT CVA.  The various methods of treatment have been discussed with the patient and family. After consideration of risks, benefits and other options for treatment, the patient has consented to  Procedure(s): ?TRANSESOPHAGEAL ECHOCARDIOGRAM (TEE) (N/A) as a surgical intervention.  The patient's history has been reviewed, patient examined, no change in status, stable for surgery.  I have reviewed the patient's chart and labs.  Questions were answered to the patient's satisfaction.   ? ? ?Little Ishikawa ? ? ?

## 2021-08-19 ENCOUNTER — Encounter (HOSPITAL_COMMUNITY): Payer: Self-pay | Admitting: Cardiology

## 2021-08-19 NOTE — Anesthesia Postprocedure Evaluation (Signed)
Anesthesia Post Note ? ?Patient: MAKENIZE MESSMAN ? ?Procedure(s) Performed: TRANSESOPHAGEAL ECHOCARDIOGRAM (TEE) ?BUBBLE STUDY ? ?  ? ?Patient location during evaluation: Endoscopy ?Anesthesia Type: MAC ?Level of consciousness: awake and alert ?Pain management: pain level controlled ?Vital Signs Assessment: post-procedure vital signs reviewed and stable ?Respiratory status: spontaneous breathing ?Cardiovascular status: stable ?Anesthetic complications: no ? ? ?No notable events documented. ? ?Last Vitals:  ?Vitals:  ? 08/18/21 1004 08/18/21 1018  ?BP: 122/79 122/79  ?Pulse: 80 74  ?Resp: 16 13  ?Temp: 37.3 ?C 37.1 ?C  ?SpO2: 100% 100%  ?  ?Last Pain:  ?Vitals:  ? 08/18/21 1018  ?TempSrc:   ?PainSc: 0-No pain  ? ? ?  ?  ?  ?  ?  ?  ? ?Nolon Nations ? ? ? ? ?

## 2021-08-20 ENCOUNTER — Other Ambulatory Visit: Payer: Self-pay

## 2021-08-20 DIAGNOSIS — Q2112 Patent foramen ovale: Secondary | ICD-10-CM

## 2021-08-20 DIAGNOSIS — I639 Cerebral infarction, unspecified: Secondary | ICD-10-CM

## 2021-08-20 NOTE — Progress Notes (Signed)
Referral placed.

## 2021-08-25 ENCOUNTER — Encounter: Payer: Self-pay | Admitting: Internal Medicine

## 2021-08-25 ENCOUNTER — Other Ambulatory Visit: Payer: Self-pay

## 2021-08-25 ENCOUNTER — Ambulatory Visit (INDEPENDENT_AMBULATORY_CARE_PROVIDER_SITE_OTHER): Payer: PRIVATE HEALTH INSURANCE | Admitting: Internal Medicine

## 2021-08-25 VITALS — BP 108/86 | HR 90 | Ht 66.0 in | Wt 218.8 lb

## 2021-08-25 DIAGNOSIS — I10 Essential (primary) hypertension: Secondary | ICD-10-CM

## 2021-08-25 DIAGNOSIS — F17200 Nicotine dependence, unspecified, uncomplicated: Secondary | ICD-10-CM | POA: Diagnosis not present

## 2021-08-25 DIAGNOSIS — Z6835 Body mass index (BMI) 35.0-35.9, adult: Secondary | ICD-10-CM | POA: Diagnosis not present

## 2021-08-25 DIAGNOSIS — I63441 Cerebral infarction due to embolism of right cerebellar artery: Secondary | ICD-10-CM | POA: Diagnosis not present

## 2021-08-25 NOTE — Progress Notes (Signed)
?Cardiology Office Note:   ? ?Date:  08/25/2021  ? ?ID:  Tonya [name], DOB 07-28-1985, MRN [id number] ? ?PCP:  Lanelle Bal, PA-C  ? ?[facility] Providers ?Cardiologist: Berniece Salines, DO ?Referring MD: Lanelle Bal, PA-C  ? ?Chief Complaint/Reason for Referral: Stroke with possible PFO ? ?ASSESSMENT:   ? ?1. 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 Essential hypertension   ?3. BMI 35.0-35.9,adult   ?4. Vaping nicotine dependence, non-tobacco product   ? ? ?PLAN:   ? ?In order of problems listed above: ?1.  I reviewed the patient's collection of imaging data.  Transthoracic echocardiogram demonstrated no shunt with bubble study.  Notably her transcranial Doppler with and without Valsalva was negative.  This is highly sensitive and specific test.  I reviewed her TEE which is difficult to assess.  On careful review of the TEE images I think the bubble study is falsely positive (with detection of bubbles transiting from the immediate previous injection).  I will review this with imaging section.  Further work-up including follow-up of her lupus anticoagulant results is recommended.  The patient will follow-up with neurology, hematology, and her general cardiologist.  We will keep follow-up with me open-ended. ?2.  Blood pressures well controlled on her current regimen. ?3.  Stressed the need for diet and exercise. ?4.  Stressed need for continued abstinence from tobacco. ? ?  ? ?Dispo:  Return if symptoms worsen or fail to improve.  ? ?  ? ?Medication Adjustments/Labs and Tests Ordered: ?Current medicines are reviewed at length with the patient today.  Concerns regarding medicines are outlined above.  ? ?Tests Ordered: ?No orders of the defined types were placed in this encounter. ? ? ?Medication Changes: ?No orders of the defined types were placed in this encounter. ? ? ?History of Present Illness:   ? ?FOCUSED PROBLEM LIST:   ?1.  BMI of 35 ?2.  Previous tobacco  abuse ?3.  Hypertension ?4.  Migraines ?5.  GERD ? ?The patient is a 36 y.o. female with the indicated medical history here for recommendations regarding her recent stroke.  The patient presented to [facility] in January 2023 with dizziness, nausea, abnormal gait, and visual field changes.  He was ultimately evaluated for stroke and a CT demonstrated right cerebral artery infarct.  This was confirmed by MRI which demonstrated right cerebellar, PICA, and SCA territory infarctions.  A transthoracic echocardiogram was performed which showed no LV thrombus with a normal ejection fraction and no shunt by bubble study.  The patient then underwent transcranial Dopplers which were negative for shunt.  Her hypercoagulable work-up was positive for lupus anticoagulant and the patient was seen by hematology with plans for repeat lupus anticoagulant panel. ? ?She was seen by Dr. Harriet Masson to establish cardiovascular care and February 2023.  At that visit the patient was doing relatively well.  A Holter monitor was placed which showed no evidence of arrhythmia.  The patient was referred for TEE which showed a possible intra-atrial shunt by bubble study. ?   ?The patient has been doing fairly well.  She has been back to work since shortly after her admission in early January.  She has had no recurrent signs or symptoms of stroke.  She denies any severe bleeding.  She has had no chest pain.  She has some good days and bad days in terms of fatigue level.  She is required no hospitalizations or emergency room visits since January.  She is otherwise well without significant complaints. ? ? ?Current Medications: ?Current Meds  ?Medication Sig  ? aspirin EC 81 MG EC tablet Take 1 tablet (81 mg total) by mouth daily.  ? atorvastatin (LIPITOR) 80 MG tablet Take 1 tablet (80 mg total) by mouth daily.  ? hydrochlorothiazide (HYDRODIURIL) 25 MG tablet Take 1 tablet (25 mg total) by mouth daily.  ? ibuprofen (ADVIL) 200 MG tablet Take 800  mg by mouth every 8 (eight) hours as needed (headache).  ? losartan (COZAAR) 100 MG tablet Take 100 mg by mouth daily.  ? meloxicam (MOBIC) 7.5 MG tablet Take 1 tablet (7.5 mg total) by mouth daily.  ? Vitamin D, Ergocalciferol, (DRISDOL) 1.25 MG (50000 UNIT) CAPS capsule Take 1 capsule (50,000 Units total) by mouth every 7 (seven) days. (Patient taking differently: Take 50,000 Units by mouth every Monday.)  ?  ? ?Allergies:    ?Green dyes and Ibuprofen  ? ?Social History:   ?Social History  ? ?Tobacco Use  ? Smoking status: Former  ?  Types: Cigarettes  ?  Quit date: 2015  ?  Years since quitting: 8.2  ? Smokeless tobacco: Never  ?Vaping Use  ? Vaping Use: Former  ? Quit date: 03/06/2020  ?Substance Use Topics  ? Alcohol use: Yes  ?  Alcohol/week: 0.0 standard drinks  ?  Comment: occasional  ? Drug use: No  ?  ? ?Family Hx: ?Family History  ?Problem Relation Age of Onset  ? Diabetes Paternal Uncle   ? Hypertension Paternal Grandmother   ? Obesity Other   ? Sleep apnea Other   ? Hypertension Maternal Grandmother   ? Hypertension Maternal Grandfather   ? Diabetes Maternal Grandfather   ? Stroke Maternal Grandfather   ? Hypertension Paternal Grandfather   ? Thrombophlebitis Paternal Grandfather   ? Hyperlipidemia Paternal Grandfather   ?  ? ?Review of Systems:   ?Please see the history of present illness.    ?All other systems reviewed and are negative. ?  ? ? ?EKGs/Labs/Other Test Reviewed:   ? ?EKG: January 2023 normal sinus rhythm ? ?Prior CV studies: ? ?1/23 CT-scan of the brain: With apparent hypodensity in R inferior cerebellum. No other lesions ?  ?1/23 cMRI examination of the brain: With wedge-shaped infarct as well as multiple other small areas of infarction, all within R  cerebellum, inferior and posterior. ?  ?1/23 CTA Head and Neck: No vasculature with dissection, stenoses, nor occlusion. ? ?1/23 TTE ? 1. Left ventricular ejection fraction, by estimation, is 60 to 65%. The  ?left ventricle has normal  function. The left ventricle has no regional  ?wall motion abnormalities. Left ventricular diastolic parameters were  ?normal.  ? 2. Area of lateral apical RV hypokinesis . Right ventricular systolic  ?function is mildly reduced. The right ventricular size is mildly enlarged.  ? 3. The mitral valve is normal in structure. No evidence of mitral valve  ?regurgitation. No evidence of mitral stenosis.  ? 4. The aortic valve is tricuspid. Aortic valve regurgitation is not  ?visualized. No aortic stenosis is present.  ? 5. The inferior vena cava is normal in size with greater than 50%  ?respiratory variability, suggesting right atrial pressure of 3 mmHg.  ? 6. Agitated saline contrast bubble study was negative, with no evidence  ?of any interatrial shunt.  ? ?1/23 TCD ?No HITS at rest or during Valsalva. Negative transcranial Doppler Bubble  ?study with no evidence of right to left intracardiac communication.  ? ?  3/23 TEE ?1. Left ventricular ejection fraction, by estimation, is 60 to 65%. The  ?left ventricle has normal function.  ? 2. Right ventricular systolic function is normal. The right ventricular  ?size is normal.  ? 3. No left atrial/left atrial appendage thrombus was detected.  ? 4. The mitral valve is normal in structure. Trivial mitral valve  ?regurgitation.  ? 5. The aortic valve is tricuspid. Aortic valve regurgitation is not  ?visualized. No aortic stenosis is present.  ? 6. Agitated saline contrast bubble study was positive with shunting  ?observed within 3-6 cardiac cycles suggestive of interatrial shunt.  ? ?Imaging studies that I have independently reviewed today: CT, MRI, CTA, TEE, TTE, transcranial Doppler ? ? ? ?Recent Labs: ?06/13/2021: TSH 1.918 ?07/29/2021: BUN 9; Creatinine, Ser 0.62; Hemoglobin 13.6; Magnesium 2.2; Platelets 345; Potassium 4.0; Sodium 143  ? ?Recent Lipid Panel ?Lab Results  ?Component Value Date/Time  ? CHOL 251 (H) 06/13/2021 02:58 PM  ? TRIG 146 06/13/2021 02:58 PM  ? HDL 41  06/13/2021 02:58 PM  ? LDLCALC 181 (H) 06/13/2021 02:58 PM  ? LDLDIRECT 141.9 01/06/2012 09:22 AM  ? ? ?Risk Assessment/Calculations:   ? ?  ?    ? ?Physical Exam:   ? ?VS:  BP 108/86   Pulse 90   Ht 5\' 6"  (1.676

## 2021-08-25 NOTE — Patient Instructions (Signed)
Medication Instructions:  ?The current medical regimen is effective;  continue present plan and medications. ? ?*If you need a refill on your cardiac medications before your next appointment, please call your pharmacy* ? ?Follow-Up: ?At [facility], you and your health needs are our priority.  As part of our continuing mission to provide you with exceptional heart care, we have created designated Provider Care Teams.  These Care Teams include your primary Cardiologist (physician) and Advanced Practice Providers (APPs -  Physician Assistants and Nurse Practitioners) who all work together to provide you with the care you need, when you need it. ? ?We recommend signing up for the patient portal called "MyChart".  Sign up information is provided on this After Visit Summary.  MyChart is used to connect with patients for Virtual Visits (Telemedicine).  Patients are able to view lab/test results, encounter notes, upcoming appointments, etc.  Non-urgent messages can be sent to your provider as well.   ?To learn more about what you can do with MyChart, go to [email or web address].   ? ?Your next appointment:   ?Due 8/ 2023  ? ?The format for your next appointment:   ?In Person ? ?Provider:   ?Thomasene Ripple, DO   ? ?Thank you for choosing [facility]!! ? ? ? ?

## 2021-08-28 ENCOUNTER — Other Ambulatory Visit: Payer: Self-pay | Admitting: Neurology

## 2021-08-28 DIAGNOSIS — Q2112 Patent foramen ovale: Secondary | ICD-10-CM

## 2021-09-02 ENCOUNTER — Ambulatory Visit: Payer: PRIVATE HEALTH INSURANCE | Admitting: Orthopedic Surgery

## 2021-09-05 ENCOUNTER — Telehealth: Payer: Self-pay | Admitting: *Deleted

## 2021-09-05 NOTE — Telephone Encounter (Signed)
-----   Message from Early Osmond, MD sent at 08/28/2021 11:05 AM EDT ----- ?Regarding: TEE results ?Please let her know I reviewed her imaging with the imagers and they agree there is no hole in the heart that needs to be closed. ? ?

## 2021-09-05 NOTE — Telephone Encounter (Signed)
Left detailed message (DPR) of the message from Dr. Lynnette Caffey.  Adv to call back if any questions. ?

## 2021-09-09 ENCOUNTER — Telehealth: Payer: Self-pay | Admitting: Neurology

## 2021-09-09 NOTE — Telephone Encounter (Signed)
Primary care insurance: no auth req spoke with Wells Guiles patient is scheduled for 10/01/21 at [facility] ?

## 2021-09-17 ENCOUNTER — Institutional Professional Consult (permissible substitution): Payer: PRIVATE HEALTH INSURANCE | Admitting: Neurology

## 2021-09-23 ENCOUNTER — Inpatient Hospital Stay (HOSPITAL_COMMUNITY): Payer: PRIVATE HEALTH INSURANCE | Attending: Hematology

## 2021-09-23 DIAGNOSIS — Z9079 Acquired absence of other genital organ(s): Secondary | ICD-10-CM | POA: Diagnosis not present

## 2021-09-23 DIAGNOSIS — Z8349 Family history of other endocrine, nutritional and metabolic diseases: Secondary | ICD-10-CM | POA: Insufficient documentation

## 2021-09-23 DIAGNOSIS — I639 Cerebral infarction, unspecified: Secondary | ICD-10-CM

## 2021-09-23 DIAGNOSIS — Z87891 Personal history of nicotine dependence: Secondary | ICD-10-CM | POA: Diagnosis not present

## 2021-09-23 DIAGNOSIS — Z79899 Other long term (current) drug therapy: Secondary | ICD-10-CM | POA: Diagnosis not present

## 2021-09-23 DIAGNOSIS — Z836 Family history of other diseases of the respiratory system: Secondary | ICD-10-CM | POA: Diagnosis not present

## 2021-09-23 DIAGNOSIS — Z87440 Personal history of urinary (tract) infections: Secondary | ICD-10-CM | POA: Insufficient documentation

## 2021-09-23 DIAGNOSIS — Z823 Family history of stroke: Secondary | ICD-10-CM | POA: Diagnosis not present

## 2021-09-23 DIAGNOSIS — Z886 Allergy status to analgesic agent status: Secondary | ICD-10-CM | POA: Diagnosis not present

## 2021-09-23 DIAGNOSIS — Z832 Family history of diseases of the blood and blood-forming organs and certain disorders involving the immune mechanism: Secondary | ICD-10-CM | POA: Diagnosis not present

## 2021-09-23 DIAGNOSIS — Z8673 Personal history of transient ischemic attack (TIA), and cerebral infarction without residual deficits: Secondary | ICD-10-CM | POA: Diagnosis not present

## 2021-09-23 DIAGNOSIS — R7989 Other specified abnormal findings of blood chemistry: Secondary | ICD-10-CM | POA: Diagnosis present

## 2021-09-23 DIAGNOSIS — D6862 Lupus anticoagulant syndrome: Secondary | ICD-10-CM | POA: Diagnosis present

## 2021-09-23 DIAGNOSIS — Z8249 Family history of ischemic heart disease and other diseases of the circulatory system: Secondary | ICD-10-CM | POA: Insufficient documentation

## 2021-09-23 DIAGNOSIS — Z833 Family history of diabetes mellitus: Secondary | ICD-10-CM | POA: Insufficient documentation

## 2021-09-23 DIAGNOSIS — R42 Dizziness and giddiness: Secondary | ICD-10-CM | POA: Diagnosis not present

## 2021-09-23 DIAGNOSIS — I10 Essential (primary) hypertension: Secondary | ICD-10-CM | POA: Insufficient documentation

## 2021-09-23 DIAGNOSIS — R200 Anesthesia of skin: Secondary | ICD-10-CM | POA: Diagnosis not present

## 2021-09-24 LAB — LUPUS ANTICOAGULANT PANEL
DRVVT: 38.2 s (ref 0.0–47.0)
PTT Lupus Anticoagulant: 33 s (ref 0.0–43.5)

## 2021-09-25 LAB — CARDIOLIPIN ANTIBODIES, IGG, IGM, IGA
Anticardiolipin IgA: <9 APL U/mL (ref 0–11)
Anticardiolipin IgG: <9 GPL U/mL (ref 0–14)
Anticardiolipin IgM: <9 MPL U/mL (ref 0–12)

## 2021-09-25 LAB — BETA-2-GLYCOPROTEIN I ABS, IGG/M/A
Beta-2 Glyco I IgG: <9 GPI IgG units (ref 0–20)
Beta-2-Glycoprotein I IgA: <9 GPI IgA units (ref 0–25)
Beta-2-Glycoprotein I IgM: <9 GPI IgM units (ref 0–32)

## 2021-09-30 ENCOUNTER — Inpatient Hospital Stay (HOSPITAL_BASED_OUTPATIENT_CLINIC_OR_DEPARTMENT_OTHER): Payer: PRIVATE HEALTH INSURANCE | Admitting: Hematology

## 2021-09-30 VITALS — BP 132/95 | HR 88 | Temp 98.8°F | Resp 18 | Ht 66.0 in | Wt 220.4 lb

## 2021-09-30 DIAGNOSIS — I639 Cerebral infarction, unspecified: Secondary | ICD-10-CM

## 2021-09-30 DIAGNOSIS — D6862 Lupus anticoagulant syndrome: Secondary | ICD-10-CM | POA: Diagnosis not present

## 2021-09-30 DIAGNOSIS — R760 Raised antibody titer: Secondary | ICD-10-CM | POA: Diagnosis not present

## 2021-09-30 NOTE — Progress Notes (Signed)
? ?[facility] ?[address] ?[place], [place] [address] ? ? ?CLINIC:  ?Medical Oncology/Hematology ? ?PCP:  ?Lanelle Bal, PA-C ?[place] [facility] [place] [address]  ?[phone] ? ?REASON FOR VISIT:  ?Follow-up for abnormal coagulation panel and CVA ? ?PRIOR THERAPY: none ? ?CURRENT THERAPY: under work-up ? ?INTERVAL HISTORY:  ?[name], a 36 y.o. female, returns for routine follow-up for her abnormal coagulation panel and CVA. [place] was last seen on 08/14/2021. ? ?Today she reports feeling good. She reports occasional dizziness when going down an escalator and eye "floaters", but she reports this has improved. ? ?REVIEW OF SYSTEMS:  ?Review of Systems  ?Constitutional:  Negative for appetite change and fatigue.  ?Neurological:  Positive for dizziness and numbness.  ?All other systems reviewed and are negative. ? ?PAST MEDICAL/SURGICAL HISTORY:  ?Past Medical History:  ?Diagnosis Date  ? Cystitis   ? Occas  ? Dizziness   ? Frequent UTI   ? GERD (gastroesophageal reflux disease)   ? Hand tingling   ? And feet  ? Headache(784.0)   ? Hypertension   ? no meds, patient reports this only while pregnant, not currently  ? PONV (postoperative nausea and vomiting)   ? Rapid heart rate   ? during pregnancy only  ? Seasonal allergies   ? ?Past Surgical History:  ?Procedure Laterality Date  ? ABDOMINAL HYSTERECTOMY    ? ADENOIDECTOMY    ? as a child  ? BACK SURGERY  2012  ? BREAST LUMPECTOMY Left   ? Benign  ? BUBBLE STUDY  08/18/2021  ? Procedure: BUBBLE STUDY;  Surgeon: Donato Heinz, MD;  Location: [facility];  Service: Cardiovascular;;  ? LAPAROSCOPIC VAGINAL HYSTERECTOMY WITH SALPINGECTOMY Bilateral 03/18/2017  ? Procedure: LAPAROSCOPIC ASSISTED VAGINAL HYSTERECTOMY WITH SALPINGECTOMY;  Surgeon: Marylynn Pearson, MD;  Location: [facility];  Service: Gynecology;  Laterality: Bilateral;  NEEDS BED  ? LUMBAR LAMINECTOMY Left 01/25/2019  ? Procedure: LEFT LUMBAR FIVE TO SACRAL  ONE, RECURRENT MICRODISCECTOMY;  Surgeon: Marybelle Killings, MD;  Location: [facility];  Service: Orthopedics;  Laterality: Left;  ? LUMBAR LAMINECTOMY/DECOMPRESSION MICRODISCECTOMY N/A 04/10/2019  ? Procedure: LEFT L5-S1 REDO MICRODISCECTOMY;  Surgeon: Jessy Oto, MD;  Location: [facility];  Service: Orthopedics;  Laterality: N/A;  ? SHOULDER SURGERY  2008, 2009  ? L shoulder, acromion shave and recurrent dislocation.  ? SPINE SURGERY  03/06/11  ? lumbar disc L5  ? TEE WITHOUT CARDIOVERSION N/A 08/18/2021  ? Procedure: TRANSESOPHAGEAL ECHOCARDIOGRAM (TEE);  Surgeon: Donato Heinz, MD;  Location: [facility];  Service: Cardiovascular;  Laterality: N/A;  ? TUBAL LIGATION Bilateral 05/07/2014  ? Procedure: POST PARTUM TUBAL LIGATION;  Surgeon: Marylynn Pearson, MD;  Location: [facility];  Service: Gynecology;  Laterality: Bilateral;  ? VAGINAL DELIVERY  2014, 2015  ? WISDOM TOOTH EXTRACTION    ? ? ?SOCIAL HISTORY:  ?Social History  ? ?Socioeconomic History  ? Marital status: Married  ?  Spouse name: Not on file  ? Number of children: Not on file  ? Years of education: Not on file  ? Highest education level: Not on file  ?Occupational History  ? Not on file  ?Tobacco Use  ? Smoking status: Former  ?  Types: Cigarettes  ?  Quit date: 2015  ?  Years since quitting: 8.3  ? Smokeless tobacco: Never  ?Vaping Use  ? Vaping Use: Former  ? Quit date: 03/06/2020  ?Substance and Sexual Activity  ?  Alcohol use: Yes  ?  Alcohol/week: 0.0 standard drinks  ?  Comment: occasional  ? Drug use: No  ? Sexual activity: Not Currently  ?  Birth control/protection: None  ?  Comment: Hysterectomy  ?Other Topics Concern  ? Not on file  ?Social History Narrative  ? Not on file  ? ?Social Determinants of Health  ? ?Financial Resource Strain: Not on file  ?Food Insecurity: Not on file  ?Transportation Needs: Not on file  ?Physical Activity: Not on file  ?Stress: Not on file  ?Social Connections: Not on file  ?Intimate Partner Violence: Not on file   ? ? ?FAMILY HISTORY:  ?Family History  ?Problem Relation Age of Onset  ? Diabetes Paternal Uncle   ? Hypertension Paternal Grandmother   ? Obesity Other   ? Sleep apnea Other   ? Hypertension Maternal Grandmother   ? Hypertension Maternal Grandfather   ? Diabetes Maternal Grandfather   ? Stroke Maternal Grandfather   ? Hypertension Paternal Grandfather   ? Thrombophlebitis Paternal Grandfather   ? Hyperlipidemia Paternal Grandfather   ? ? ?CURRENT MEDICATIONS:  ?Current Outpatient Medications  ?Medication Sig Dispense Refill  ? aspirin EC 81 MG EC tablet Take 1 tablet (81 mg total) by mouth daily. 30 tablet 0  ? atorvastatin (LIPITOR) 80 MG tablet Take 1 tablet (80 mg total) by mouth daily. 30 tablet 0  ? hydrochlorothiazide (HYDRODIURIL) 25 MG tablet Take 1 tablet (25 mg total) by mouth daily. 30 tablet 0  ? ibuprofen (ADVIL) 200 MG tablet Take 800 mg by mouth every 8 (eight) hours as needed (headache).    ? losartan (COZAAR) 100 MG tablet Take 100 mg by mouth daily.    ? Vitamin D, Ergocalciferol, (DRISDOL) 1.25 MG (50000 UNIT) CAPS capsule Take 1 capsule (50,000 Units total) by mouth every 7 (seven) days. (Patient taking differently: Take 50,000 Units by mouth every Monday.) 12 capsule 0  ? ?No current facility-administered medications for this visit.  ? ? ?ALLERGIES:  ?Allergies  ?Allergen Reactions  ? Green Dyes Hives  ? Ibuprofen Other (See Comments)  ?  Esophagitis and chest pain - pt states that its ok to take with heartburn medication  ? ? ?PHYSICAL EXAM:  ?Performance status (ECOG): 0 - Asymptomatic ? ?Vitals:  ? 09/30/21 1210  ?BP: (!) 132/95  ?Pulse: 88  ?Resp: 18  ?Temp: 98.8 ?F (37.1 ?C)  ?SpO2: 100%  ? ?Wt Readings from Last 3 Encounters:  ?09/30/21 220 lb 6.4 oz (100 kg)  ?08/25/21 218 lb 12.8 oz (99.2 kg)  ?08/18/21 221 lb (100.2 kg)  ? ?Physical Exam ?Vitals reviewed.  ?Constitutional:   ?   Appearance: Normal appearance. She is obese.  ?Cardiovascular:  ?   Rate and Rhythm: Normal rate and  regular rhythm.  ?   Pulses: Normal pulses.  ?   Heart sounds: Normal heart sounds.  ?Pulmonary:  ?   Effort: Pulmonary effort is normal.  ?   Breath sounds: Normal breath sounds.  ?Neurological:  ?   General: No focal deficit present.  ?   Mental Status: She is alert and oriented to person, place, and time.  ?Psychiatric:     ?   Mood and Affect: Mood normal.     ?   Behavior: Behavior normal.  ? ? ?LABORATORY DATA:  ?I have reviewed the labs as listed.  ? ?  Latest Ref Rng & Units 07/29/2021  ? 10:41 AM 06/13/2021  ?  2:58 PM 06/12/2021  ?  7:05 PM  ?CBC  ?WBC 3.4 - 10.8 x10E3/uL 7.2   13.5   17.8    ?Hemoglobin 11.1 - 15.9 g/dL 13.6   15.4   15.6    ?Hematocrit 34.0 - 46.6 % 40.3   47.4   47.1    ?Platelets 150 - 450 x10E3/uL 345   295   413    ? ? ?  Latest Ref Rng & Units 07/29/2021  ? 10:41 AM 06/13/2021  ?  2:58 PM 06/12/2021  ?  7:05 PM  ?CMP  ?Glucose 70 - 99 mg/dL 94   91   100    ?BUN 6 - 20 mg/dL 9   9   12     ?Creatinine 0.57 - 1.00 mg/dL 0.62   0.53   0.73    ?Sodium 134 - 144 mmol/L 143   132   133    ?Potassium 3.5 - 5.2 mmol/L 4.0   3.1   4.6    ?Chloride 96 - 106 mmol/L 101   101   97    ?CO2 20 - 29 mmol/L 26   18   25     ?Calcium 8.7 - 10.2 mg/dL 9.3   8.3   9.3    ? ?   ?Component Value Date/Time  ? RBC 4.55 07/29/2021 1041  ? RBC 5.23 (H) 06/13/2021 1458  ? MCV 89 07/29/2021 1041  ? MCH 29.9 07/29/2021 1041  ? MCH 29.4 06/13/2021 1458  ? MCHC 33.7 07/29/2021 1041  ? MCHC 32.5 06/13/2021 1458  ? RDW 12.7 07/29/2021 1041  ? LYMPHSABS 2.2 07/29/2021 1041  ? MONOABS 1.0 06/12/2021 1905  ? EOSABS 0.1 07/29/2021 1041  ? BASOSABS 0.1 07/29/2021 1041  ? ? ?DIAGNOSTIC IMAGING:  ?I have independently reviewed the scans and discussed with the patient. ?No results found.  ? ?ASSESSMENT:  ?Positive lupus anticoagulant: ?- Patient seen at the request of Dr. Harriet Masson for a positive lupus anticoagulant in the setting of cryptogenic stroke. 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 ?- CT angiogram: No evidence of dissection.  No intracranial large vessel occlusion or sign

## 2021-10-01 ENCOUNTER — Ambulatory Visit (HOSPITAL_COMMUNITY): Payer: PRIVATE HEALTH INSURANCE

## 2021-10-01 ENCOUNTER — Encounter (HOSPITAL_COMMUNITY): Payer: Self-pay

## 2021-10-22 ENCOUNTER — Telehealth: Payer: Self-pay | Admitting: Neurology

## 2021-10-22 NOTE — Telephone Encounter (Signed)
Rescheduled 6/28 appt with pt over the phone- Dr. Sethi out. ?

## 2021-11-17 ENCOUNTER — Encounter: Payer: Self-pay | Admitting: Neurology

## 2021-11-17 ENCOUNTER — Ambulatory Visit (INDEPENDENT_AMBULATORY_CARE_PROVIDER_SITE_OTHER): Payer: PRIVATE HEALTH INSURANCE | Admitting: Neurology

## 2021-11-17 VITALS — BP 138/92 | HR 86 | Ht 66.0 in | Wt 220.0 lb

## 2021-11-17 DIAGNOSIS — I639 Cerebral infarction, unspecified: Secondary | ICD-10-CM | POA: Diagnosis not present

## 2021-11-17 MED ORDER — HYDROCHLOROTHIAZIDE 25 MG PO TABS: 25.0000 mg | ORAL_TABLET | Freq: Every day | ORAL | 0 refills | Status: AC

## 2021-11-17 MED ORDER — LOSARTAN POTASSIUM 100 MG PO TABS: 100.0000 mg | ORAL_TABLET | Freq: Every day | ORAL | 0 refills | Status: AC

## 2021-11-17 MED ORDER — ATORVASTATIN CALCIUM 80 MG PO TABS: 80.0000 mg | ORAL_TABLET | Freq: Every day | ORAL | 0 refills | Status: AC

## 2021-11-17 NOTE — Patient Instructions (Signed)
I had a long d/w patient about her recent  cryptogenic stroke, discussed discrepancy of TEE and TCD Bubble findings about PFO,risk for recurrent stroke/TIAs, personally independently reviewed imaging studies and stroke evaluation results and answered questions.Continue aspirin 81 mg daily  for secondary stroke prevention and maintain strict control of hypertension with blood pressure goal below 130/90, diabetes with hemoglobin A1c goal below 6.5% and lipids with LDL cholesterol goal below 70 mg/dL. I also advised the patient to eat a healthy diet with plenty of whole grains, cereals, fruits and vegetables, exercise regularly and maintain ideal body weight She was given a 30 refill of her lipitor and BP medicines upon request. Followup in the future with me  in 1 year or call earlier if needed.

## 2021-11-17 NOTE — Progress Notes (Signed)
[facility] [address] [place]. [place] [address] [phone]       OFFICE FOLLOW UP VISIT  NOTE  [name] Date of Birth:  1986-02-26 Medical Record Number:  [id number]   Referring MD:   Vance Gather  Reason for Referral: Stroke  HPI: Initial visit 07/14/2021 :[name] is a 36 year old pleasant Caucasian lady seen today for initial office consultation for stroke.  History is obtained from the patient and review of electronic medical records and opossum reviewed pertinent available imaging films in PACS.  She has no significant past medical history is except migraines and gastroesophageal reflux disease and hypertension.  She presented on 06/12/2021 for evaluation for symptoms which initially began on 06/05/2021.  She woke up that day feeling profoundly dizzy and off balance and leaning to the right as well as having some vertigo and subsequently developed sharp headache.  She went to sleep for few hours and when she woke up she saw a lot of floaters and she called some friends when nurse came home and checked her blood pressure was significantly elevated.  Next day she went to work but felt dizzy and blood pressure was high so she saw a telehealth provider who prescribed some Norvasc eventually she got an appointment to see a primary care physician who also added an additional medication for blood pressure which remained high.  Eventually she drove herself to the emergency room 06/12/2021 for evaluation.  Her main complaint at that time was that she felt like her body was moving in slow motion as if she was floating in she had trouble walking but denies any focal extremity weakness.  CT scan of the head showed a wedge-shaped hypodensity in the right inferior aspect of the cerebellum and MRI scan subsequently showed acute infarct in the right posterior inferior cerebellum and wedge-shaped distribution as well as subacute infarct in the right superior cerebellum.  CT  angiogram showed no significant large vessel stenosis or occlusion.  LDL cholesterol was elevated 181 mg percent.  Hemoglobin A1c was 5.0.  Transcranial Doppler bubble study was negative for PFO.  2D echo showed ejection fraction of 60 to 65%.  TEE was ordered but is not yet done and is scheduled for 07/29/2020.  Patient had hypercoagulable labs sent most of it was negative except for positive lupus anticoagulant at 47.6 but patient denies any history of DVT, pulmonary embolism, recurrent miscarriages.  She also wore a 3-week external heart monitor the results of which are not back yet.  Patient denies any prior history of strokes, TIAs, seizures, loss of consciousness, palpitations or syncope.  She does not use any birth control pills or hormones.  She denies doing drugs.  She was doing some vaping at the time of her stroke which she has quit.  She states she is done well her dizziness is mostly resolved she has had no recurrent symptoms.  She however feels her stamina is quite down and she gets tired easily and is exhausted all the time.  She does admit to snoring but she has not been evaluated for sleep apnea though she has a strong family history for the same.  She also has a family history of significant coronary artery disease in the 25s and 90s in her father as well as grandfather.  She does have a few migraine headaches but these only once every 2 to 3 months and respond to over-the-counter medications.  She is currently tolerating aspirin and Plavix but does  bruise easily especially when her dog scratches her.  She is tolerating Lipitor well without muscle aches and pains.  Blood pressure is now much better controlled on Cozaar and Hydrodiurill and today it is 137/81. Update 11/17/2021 ; she returns for follow-up after last visit 4 months ago.  She is doing well and she has had no recurrent stroke or TIA symptoms.  She underwent TEE on 08/18/2021 which showed normal ejection fraction and no clot.  It showed  presence of right to left shunt however she had previously had a TCD bubble study on 06/14/2021 which was negative.  She was referred to cardiologist Dr. Ali Lowe for PFO closure but he reviewed the TEE films and felt the bubbles were probably an artifact particular since the TCD bubble study was negative and recommended conservative follow-up for now.  The patient had repeat lab work for lupus anticoagulant which was negative on 09/23/2021 suggesting that the previous mildly elevated test was an acute phase reactant.  She remains on aspirin she is tolerating well with minor bruising and no bleeding.  She is tolerating Lipitor well without muscle aches and pains.  Blood pressure is well controlled today it is 138/93.  She has no new complaints today. ROS:   14 system review of systems is positive for tiredness, decreased stamina, easy bruising, and all other systems negative  PMH:  Past Medical History:  Diagnosis Date   Cystitis    Occas   Dizziness    Frequent UTI    GERD (gastroesophageal reflux disease)    Hand tingling    And feet   Headache(784.0)    Hypertension    no meds, patient reports this only while pregnant, not currently   PONV (postoperative nausea and vomiting)    Rapid heart rate    during pregnancy only   Seasonal allergies     Social History:  Social History   Socioeconomic History   Marital status: Married    Spouse name: Not on file   Number of children: Not on file   Years of education: Not on file   Highest education level: Not on file  Occupational History   Not on file  Tobacco Use   Smoking status: Former    Types: Cigarettes    Quit date: 2015    Years since quitting: 8.4   Smokeless tobacco: Never  Vaping Use   Vaping Use: Former   Quit date: 03/06/2020  Substance and Sexual Activity   Alcohol use: Yes    Alcohol/week: 0.0 standard drinks of alcohol    Comment: occasional   Drug use: No   Sexual activity: Not Currently    Birth  control/protection: None    Comment: Hysterectomy  Other Topics Concern   Not on file  Social History Narrative   Not on file   Social Determinants of Health   Financial Resource Strain: Not on file  Food Insecurity: Not on file  Transportation Needs: Not on file  Physical Activity: Not on file  Stress: Not on file  Social Connections: Not on file  Intimate Partner Violence: Not on file    Medications:   Current Outpatient Medications on File Prior to Visit  Medication Sig Dispense Refill   aspirin EC 81 MG EC tablet Take 1 tablet (81 mg total) by mouth daily. 30 tablet 0   ibuprofen (ADVIL) 200 MG tablet Take 800 mg by mouth every 8 (eight) hours as needed (headache).     Vitamin D, Ergocalciferol, (DRISDOL) 1.25  MG (50000 UNIT) CAPS capsule Take 1 capsule (50,000 Units total) by mouth every 7 (seven) days. (Patient taking differently: Take 50,000 Units by mouth every Monday.) 12 capsule 0   No current facility-administered medications on file prior to visit.    Allergies:   Allergies  Allergen Reactions   Green Dyes Hives   Ibuprofen Other (See Comments)    Esophagitis and chest pain - pt states that its ok to take with heartburn medication    Physical Exam General: Mildly obese young Caucasian lady, seated, in no evident distress Head: head normocephalic and atraumatic.   Neck: supple with no carotid or supraclavicular bruits Cardiovascular: regular rate and rhythm, no murmurs Musculoskeletal: no deformity Skin:  no rash/petichiae Vascular:  Normal pulses all extremities  Neurologic Exam Mental Status: Awake and fully alert. Oriented to place and time. Recent and remote memory intact. Attention span, concentration and fund of knowledge appropriate. Mood and affect appropriate.  Cranial Nerves: Fundoscopic exam not done. Pupils equal, briskly reactive to light. Extraocular movements full without nystagmus. Visual fields full to confrontation. Hearing intact. Facial  sensation intact. Face, tongue, palate moves normally and symmetrically.  Motor: Normal bulk and tone. Normal strength in all tested extremity muscles. Sensory.: intact to touch , pinprick , position and vibratory sensation.  Coordination: Rapid alternating movements normal in all extremities. Finger-to-nose and heel-to-shin performed accurately bilaterally. Gait and Station: Arises from chair without difficulty. Stance is normal. Gait demonstrates normal stride length and balance . Able to heel, toe and tandem walk without difficulty.  Reflexes: 1+ and symmetric. Toes downgoing.   NIHSS  0 Modified Rankin  1   ASSESSMENT: 36 year old Caucasian lady with right posterior inferior cerebral artery acute as well as right superior cerebral artery subacute infarction December 2022 and January 2023 of cryptogenic etiology.  Vascular risk factors of hypertension, hyperlipidemia obesity and at risk for sleep apnea.       PLAN:I had a long d/w patient about her recent  cryptogenic stroke, discussed discrepancy of TEE and TCD Bubble findings about PFO,risk for recurrent stroke/TIAs, personally independently reviewed imaging studies and stroke evaluation results and answered questions.Continue aspirin 81 mg daily  for secondary stroke prevention and maintain strict control of hypertension with blood pressure goal below 130/90, diabetes with hemoglobin A1c goal below 6.5% and lipids with LDL cholesterol goal below 70 mg/dL. I also advised the patient to eat a healthy diet with plenty of whole grains, cereals, fruits and vegetables, exercise regularly and maintain ideal body weight She was given a 30 day refill of her lipitor and BP medicines upon request. Followup in the future with me  in 1 year or call earlier if needed. Greater than 50% time during this 45-minute consultation visit was spent on counseling and coordination of care about her cryptogenic stroke and discussion about stroke evaluation and treatment  and answering questions. Antony Contras, MD  Note: This document was prepared with digital dictation and possible smart phrase technology. Any transcriptional errors that result from this process are unintentional.

## 2021-11-28 ENCOUNTER — Emergency Department (HOSPITAL_COMMUNITY)
Admission: EM | Admit: 2021-11-28 | Discharge: 2021-11-29 | Disposition: A | Discharge status: Home / Self Care | Payer: PRIVATE HEALTH INSURANCE | Attending: Emergency Medicine | Admitting: Emergency Medicine

## 2021-11-28 ENCOUNTER — Encounter (HOSPITAL_COMMUNITY): Payer: Self-pay | Admitting: Emergency Medicine

## 2021-11-28 ENCOUNTER — Emergency Department (HOSPITAL_COMMUNITY): Payer: PRIVATE HEALTH INSURANCE

## 2021-11-28 ENCOUNTER — Other Ambulatory Visit: Payer: Self-pay

## 2021-11-28 DIAGNOSIS — R112 Nausea with vomiting, unspecified: Secondary | ICD-10-CM | POA: Diagnosis not present

## 2021-11-28 DIAGNOSIS — Z79899 Other long term (current) drug therapy: Secondary | ICD-10-CM | POA: Insufficient documentation

## 2021-11-28 DIAGNOSIS — I10 Essential (primary) hypertension: Secondary | ICD-10-CM | POA: Diagnosis not present

## 2021-11-28 DIAGNOSIS — R519 Headache, unspecified: Secondary | ICD-10-CM | POA: Insufficient documentation

## 2021-11-28 DIAGNOSIS — R109 Unspecified abdominal pain: Secondary | ICD-10-CM | POA: Diagnosis not present

## 2021-11-28 DIAGNOSIS — Z7982 Long term (current) use of aspirin: Secondary | ICD-10-CM | POA: Diagnosis not present

## 2021-11-28 LAB — COMPREHENSIVE METABOLIC PANEL
ALT: 17 U/L (ref 0–44)
AST: 19 U/L (ref 15–41)
Albumin: 4.2 g/dL (ref 3.5–5.0)
Alkaline Phosphatase: 81 U/L (ref 38–126)
Anion gap: 13 (ref 5–15)
BUN: 12 mg/dL (ref 6–20)
CO2: 24 mmol/L (ref 22–32)
Calcium: 9.6 mg/dL (ref 8.9–10.3)
Chloride: 101 mmol/L (ref 98–111)
Creatinine, Ser: 0.71 mg/dL (ref 0.44–1.00)
GFR, Estimated: >60 mL/min (ref >60)
Glucose, Bld: 105 mg/dL — ABNORMAL HIGH (ref 70–99)
Potassium: 3.9 mmol/L (ref 3.5–5.1)
Sodium: 138 mmol/L (ref 135–145)
Total Bilirubin: 0.7 mg/dL (ref 0.3–1.2)
Total Protein: 7.5 g/dL (ref 6.5–8.1)

## 2021-11-28 LAB — DIFFERENTIAL
Abs Immature Granulocytes: 0.05 10*3/uL (ref 0.00–0.07)
Basophils Absolute: 0.1 10*3/uL (ref 0.0–0.1)
Basophils Relative: 1 %
Eosinophils Absolute: 0.2 10*3/uL (ref 0.0–0.5)
Eosinophils Relative: 2 %
Immature Granulocytes: 0 %
Lymphocytes Relative: 24 %
Lymphs Abs: 3 10*3/uL (ref 0.7–4.0)
Monocytes Absolute: 0.8 10*3/uL (ref 0.1–1.0)
Monocytes Relative: 7 %
Neutro Abs: 8.6 10*3/uL — ABNORMAL HIGH (ref 1.7–7.7)
Neutrophils Relative %: 66 %

## 2021-11-28 LAB — PROTIME-INR
INR: 1 (ref 0.8–1.2)
Prothrombin Time: 13 seconds (ref 11.4–15.2)

## 2021-11-28 LAB — CBC
HCT: 46.6 % — ABNORMAL HIGH (ref 36.0–46.0)
Hemoglobin: 15.9 g/dL — ABNORMAL HIGH (ref 12.0–15.0)
MCH: 29.9 pg (ref 26.0–34.0)
MCHC: 34.1 g/dL (ref 30.0–36.0)
MCV: 87.8 fL (ref 80.0–100.0)
Platelets: 349 10*3/uL (ref 150–400)
RBC: 5.31 MIL/uL — ABNORMAL HIGH (ref 3.87–5.11)
RDW: 11.9 % (ref 11.5–15.5)
WBC: 12.8 10*3/uL — ABNORMAL HIGH (ref 4.0–10.5)
nRBC: 0 % (ref 0.0–0.2)

## 2021-11-28 LAB — ETHANOL: Alcohol, Ethyl (B): <10 mg/dL (ref <10)

## 2021-11-28 LAB — APTT: aPTT: 29 seconds (ref 24–36)

## 2021-11-28 LAB — CBG MONITORING, ED: Glucose-Capillary: 99 mg/dL (ref 70–99)

## 2021-11-28 MED ORDER — SODIUM CHLORIDE 0.9% FLUSH
3.0000 mL | Freq: Once | INTRAVENOUS | Status: AC
  Administered 2021-11-29: 3 mL via INTRAVENOUS

## 2021-11-29 LAB — URINALYSIS, ROUTINE W REFLEX MICROSCOPIC
Bilirubin Urine: NEGATIVE
Glucose, UA: NEGATIVE mg/dL
Hgb urine dipstick: NEGATIVE
Ketones, ur: NEGATIVE mg/dL
Leukocytes,Ua: NEGATIVE
Nitrite: NEGATIVE
Protein, ur: NEGATIVE mg/dL
Specific Gravity, Urine: 1.004 — ABNORMAL LOW (ref 1.005–1.030)
pH: 6 (ref 5.0–8.0)

## 2021-11-29 MED ORDER — PROCHLORPERAZINE EDISYLATE 10 MG/2ML IJ SOLN
10.0000 mg | Freq: Once | INTRAMUSCULAR | Status: AC
  Administered 2021-11-29: 10 mg via INTRAVENOUS
  Filled 2021-11-29: qty 2

## 2021-11-29 MED ORDER — DEXAMETHASONE 4 MG PO TABS
10.0000 mg | ORAL_TABLET | Freq: Once | ORAL | Status: AC
  Administered 2021-11-29: 10 mg via ORAL
  Filled 2021-11-29: qty 3

## 2021-11-29 MED ORDER — LACTATED RINGERS IV BOLUS
1000.0000 mL | Freq: Once | INTRAVENOUS | Status: AC
  Administered 2021-11-29: 1000 mL via INTRAVENOUS

## 2021-11-29 MED ORDER — DIPHENHYDRAMINE HCL 50 MG/ML IJ SOLN
25.0000 mg | Freq: Once | INTRAMUSCULAR | Status: AC
  Administered 2021-11-29: 25 mg via INTRAVENOUS
  Filled 2021-11-29: qty 1

## 2021-12-03 ENCOUNTER — Ambulatory Visit: Payer: PRIVATE HEALTH INSURANCE | Admitting: Neurology

## 2021-12-18 ENCOUNTER — Ambulatory Visit: Payer: PRIVATE HEALTH INSURANCE | Admitting: Neurology

## 2021-12-24 ENCOUNTER — Inpatient Hospital Stay (HOSPITAL_COMMUNITY): Payer: PRIVATE HEALTH INSURANCE | Attending: Hematology

## 2021-12-31 ENCOUNTER — Ambulatory Visit (HOSPITAL_COMMUNITY): Payer: PRIVATE HEALTH INSURANCE | Admitting: Hematology

## 2022-01-22 ENCOUNTER — Ambulatory Visit: Payer: PRIVATE HEALTH INSURANCE | Admitting: Cardiology

## 2022-01-22 ENCOUNTER — Telehealth: Payer: Self-pay

## 2022-01-22 NOTE — Telephone Encounter (Signed)
Called pt to ask her to come in sooner since we had an appointment disappear from the schedule. Phone went straight to voicemail, left message.

## 2022-01-22 NOTE — Telephone Encounter (Signed)
Called pt again. Phone rang, no answer.

## 2022-07-21 ENCOUNTER — Other Ambulatory Visit (HOSPITAL_BASED_OUTPATIENT_CLINIC_OR_DEPARTMENT_OTHER): Payer: Self-pay

## 2022-07-21 DIAGNOSIS — R0683 Snoring: Secondary | ICD-10-CM

## 2022-07-21 DIAGNOSIS — I10 Essential (primary) hypertension: Secondary | ICD-10-CM

## 2022-07-21 DIAGNOSIS — R5383 Other fatigue: Secondary | ICD-10-CM

## 2022-08-10 ENCOUNTER — Ambulatory Visit: Payer: PRIVATE HEALTH INSURANCE | Attending: Family Medicine | Admitting: Pulmonary Disease

## 2022-08-10 DIAGNOSIS — I10 Essential (primary) hypertension: Secondary | ICD-10-CM | POA: Diagnosis not present

## 2022-08-10 DIAGNOSIS — R0683 Snoring: Secondary | ICD-10-CM

## 2022-08-10 DIAGNOSIS — G4733 Obstructive sleep apnea (adult) (pediatric): Secondary | ICD-10-CM | POA: Insufficient documentation

## 2022-08-10 DIAGNOSIS — R5383 Other fatigue: Secondary | ICD-10-CM

## 2022-08-13 NOTE — Procedures (Signed)
      Patient Name: Tonya, [name] Date: 08/10/2022 Gender: Female D.O.B: 16-Jan-1986 Age (years): 36 Referring Provider: Lanelle Bal PA-C Height (inches): 4 Interpreting Physician: Chesley Mires MD, ABSM Weight (lbs): 220 RPSGT: Rosebud Poles BMI: 36 MRN: [id number]  CLINICAL INFORMATION Sleep Study Type: HST  Indication for sleep study: snoring, and sleep disruption.  SLEEP STUDY TECHNIQUE A multi-channel overnight portable sleep study was performed. The channels recorded were: nasal airflow, thoracic respiratory movement, and oxygen saturation with a pulse oximetry. Snoring was also monitored.  MEDICATIONS Patient self administered medications include: N/A.  SLEEP ARCHITECTURE Patient was studied for 477.4 minutes. The sleep efficiency was 99.5 % and the patient was supine for 0%. The arousal index was 0.0 per hour.  RESPIRATORY PARAMETERS The overall AHI was 12.2 per hour, with a central apnea index of 0 per hour.  The oxygen nadir was 88% during sleep.  CARDIAC DATA Mean heart rate during sleep was 73.1 bpm.  IMPRESSIONS - Mild obstructive sleep apnea occurred during this study (AHI = 12.2/h). - Mild oxygen desaturation was noted during this study (Min O2 = 88%). - Patient snored 8.6% during the sleep.  DIAGNOSIS - Obstructive Sleep Apnea (G47.33)  RECOMMENDATIONS - Additional therapies include CPAP, oral appliance, or surgical assessment. - Avoid alcohol, sedatives and other CNS depressants that may worsen sleep apnea and disrupt normal sleep architecture. - Sleep hygiene should be reviewed to assess factors that may improve sleep quality. - Weight management and regular exercise should be initiated or continued. - Sleep medicine consultation available as needed to assist with management.  [Electronically signed] 08/13/2022 09:21 AM  Chesley Mires MD, ABSM Diplomate, American Board of Sleep Medicine NPI: [id number]  [place] PH: [phone]   FX: [phone] [place]

## 2022-08-31 ENCOUNTER — Ambulatory Visit (INDEPENDENT_AMBULATORY_CARE_PROVIDER_SITE_OTHER): Payer: PRIVATE HEALTH INSURANCE | Admitting: Nurse Practitioner

## 2022-08-31 ENCOUNTER — Encounter: Payer: Self-pay | Admitting: Nurse Practitioner

## 2022-08-31 VITALS — BP 125/84 | HR 72 | Temp 98.2°F | Ht 66.0 in | Wt 224.0 lb

## 2022-08-31 DIAGNOSIS — I63441 Cerebral infarction due to embolism of right cerebellar artery: Secondary | ICD-10-CM | POA: Diagnosis not present

## 2022-08-31 DIAGNOSIS — I10 Essential (primary) hypertension: Secondary | ICD-10-CM | POA: Diagnosis not present

## 2022-08-31 DIAGNOSIS — E559 Vitamin D deficiency, unspecified: Secondary | ICD-10-CM

## 2022-08-31 DIAGNOSIS — Z0289 Encounter for other administrative examinations: Secondary | ICD-10-CM

## 2022-08-31 DIAGNOSIS — E669 Obesity, unspecified: Secondary | ICD-10-CM

## 2022-08-31 DIAGNOSIS — Z6836 Body mass index (BMI) 36.0-36.9, adult: Secondary | ICD-10-CM

## 2022-08-31 DIAGNOSIS — E7849 Other hyperlipidemia: Secondary | ICD-10-CM

## 2022-08-31 NOTE — Progress Notes (Signed)
Office: [phone]  /  Fax: [phone]   Initial Visit  Tonya [name] was seen in clinic today to evaluate for obesity. She is interested in losing weight to improve overall health and reduce the risk of weight related complications. She presents today to review program treatment options, initial physical assessment, and evaluation.     She was referred by: Friend or Family  Her sister is a patient here.    When asked what else they would like to accomplish? She states: Improve existing medical conditions, Improve quality of life, and Lose a target amount of weight : 175 lbs   Would like to lose weight to be able to be more active with her children.   Weight history:  She started gaining weight in college and excess weight after having her children.  Her weight has fluctuated over the years.    When asked how has your weight affected you? She states: Having fatigue and Having poor endurance  Some associated conditions:  She has co morbidities of migraines, CVA, HTN, DDD, lupus, leukocytosis, HLD, vit d def, OSAS  Contributing factors: Family history, Life event, Pregnancy, and Other: after partial hysterectomy  Weight promoting medications identified: Psychotropic medications and Steroids  Current nutrition plan: None  Current level of physical activity: cardio and strength training-started 3 weeks ago.  Goes 3-7 days per week.   Current or previous pharmacotherapy: Phentermine  Response to medication: Lost weight initially but was unable to sustain weight loss   Past medical history includes:   Past Medical History:  Diagnosis Date   Cystitis    Occas   Dizziness    Frequent UTI    GERD (gastroesophageal reflux disease)    Hand tingling    And feet   Headache(784.0)    Hypertension    no meds, patient reports this only while pregnant, not currently   PONV (postoperative nausea and vomiting)    Rapid heart rate    during pregnancy only   Seasonal allergies       Objective:   BP 125/84   Pulse 72   Temp 98.2 F (36.8 C)   Ht 5\' 6"  (1.676 m)   Wt 224 lb (101.6 kg)   LMP 03/02/2017 (LMP Unknown)   SpO2 100%   BMI 36.15 kg/m  She was weighed on the bioimpedance scale: Body mass index is 36.15 kg/m.  Peak Weight:238 lbs , Body Fat%:43.3, Visceral Fat Rating:10, Weight trend over the last 12 months: Increasing  General:  Alert, oriented and cooperative. Patient is in no acute distress.  Respiratory: Normal respiratory effort, no problems with respiration noted   Gait: able to ambulate independently  Mental Status: Normal mood and affect. Normal behavior. Normal judgment and thought content.   DIAGNOSTIC DATA REVIEWED:  BMET    Component Value Date/Time   NA 138 11/28/2021 2044   NA 143 07/29/2021 1041   K 3.9 11/28/2021 2044   CL 101 11/28/2021 2044   CO2 24 11/28/2021 2044   GLUCOSE 105 (H) 11/28/2021 2044   BUN 12 11/28/2021 2044   BUN 9 07/29/2021 1041   CREATININE 0.71 11/28/2021 2044   CALCIUM 9.6 11/28/2021 2044   GFRNONAA >60 11/28/2021 2044   GFRAA >60 04/10/2019 0544   Lab Results  Component Value Date   HGBA1C 5.0 06/14/2021   No results found for: "INSULIN" CBC    Component Value Date/Time   WBC 12.8 (H) 11/28/2021 2044   RBC 5.31 (H) 11/28/2021 2044  HGB 15.9 (H) 11/28/2021 2044   HGB 13.6 07/29/2021 1041   HCT 46.6 (H) 11/28/2021 2044   HCT 40.3 07/29/2021 1041   PLT 349 11/28/2021 2044   PLT 345 07/29/2021 1041   MCV 87.8 11/28/2021 2044   MCV 89 07/29/2021 1041   MCH 29.9 11/28/2021 2044   MCHC 34.1 11/28/2021 2044   RDW 11.9 11/28/2021 2044   RDW 12.7 07/29/2021 1041   Iron/TIBC/Ferritin/ %Sat No results found for: "IRON", "TIBC", "FERRITIN", "IRONPCTSAT" Lipid Panel     Component Value Date/Time   CHOL 251 (H) 06/13/2021 1458   TRIG 146 06/13/2021 1458   HDL 41 06/13/2021 1458   CHOLHDL 6.1 06/13/2021 1458   VLDL 29 06/13/2021 1458   LDLCALC 181 (H) 06/13/2021 1458   LDLDIRECT 141.9  01/06/2012 0922   Hepatic Function Panel     Component Value Date/Time   PROT 7.5 11/28/2021 2044   ALBUMIN 4.2 11/28/2021 2044   AST 19 11/28/2021 2044   ALT 17 11/28/2021 2044   ALKPHOS 81 11/28/2021 2044   BILITOT 0.7 11/28/2021 2044   BILIDIR 0.0 01/06/2012 0922      Component Value Date/Time   TSH 1.918 06/13/2021 1458   TSH 1.01 01/06/2012 0922     Assessment and Plan:   Other hyperlipidemia Continue to follow-up with PCP and cardiology.  Continue medications as directed.  Vitamin D deficiency Continue vitamin D as directed.  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  Essential hypertension Continue to follow-up with PCP and cardiology.  Continue medications as directed.  Generalized obesity  BMI 36.0-36.9,adult    Consider Wegovy due to history of CVA, HLD, HTN     Obesity Treatment / Action Plan:  Patient will work on garnering support from family and friends to begin weight loss journey. Will work on eliminating or reducing the presence of highly palatable, calorie dense foods in the home. Will complete provided nutritional and psychosocial assessment questionnaire before the next appointment. Will be scheduled for indirect calorimetry to determine resting energy expenditure in a fasting state.  This will allow [place] to create a reduced calorie, high-protein meal plan to promote loss of fat mass while preserving muscle mass.  Obesity Education Performed Today:  She was weighed on the bioimpedance scale and results were discussed and documented in the synopsis.  We discussed obesity as a disease and the importance of a more detailed evaluation of all the factors contributing to the disease.  We discussed the importance of long term lifestyle changes which include nutrition, exercise and behavioral modifications as well as the importance of customizing this to her  specific health and social needs.  We discussed the benefits of reaching a healthier weight to alleviate the symptoms of existing conditions and reduce the risks of the biomechanical, metabolic and psychological effects of obesity.  Tonya [name] appears to be in the action stage of change and states they are ready to start intensive lifestyle modifications and behavioral modifications.  30 minutes was spent today on this visit including the above counseling, pre-visit chart review, and post-visit documentation.  Reviewed by clinician on day of visit: allergies, medications, problem list, medical history, surgical history, family history, social history, and previous encounter notes pertinent to obesity diagnosis.    Ailene Rud Corissa Oguinn FNP-C

## 2022-09-08 ENCOUNTER — Encounter: Payer: Self-pay | Admitting: Bariatrics

## 2022-09-08 ENCOUNTER — Ambulatory Visit (INDEPENDENT_AMBULATORY_CARE_PROVIDER_SITE_OTHER): Payer: PRIVATE HEALTH INSURANCE | Admitting: Bariatrics

## 2022-09-08 VITALS — BP 133/90 | HR 68 | Temp 98.0°F | Ht 66.0 in | Wt 225.0 lb

## 2022-09-08 DIAGNOSIS — R0602 Shortness of breath: Secondary | ICD-10-CM | POA: Insufficient documentation

## 2022-09-08 DIAGNOSIS — E538 Deficiency of other specified B group vitamins: Secondary | ICD-10-CM | POA: Diagnosis not present

## 2022-09-08 DIAGNOSIS — Z1331 Encounter for screening for depression: Secondary | ICD-10-CM | POA: Diagnosis not present

## 2022-09-08 DIAGNOSIS — I10 Essential (primary) hypertension: Secondary | ICD-10-CM

## 2022-09-08 DIAGNOSIS — R7309 Other abnormal glucose: Secondary | ICD-10-CM

## 2022-09-08 DIAGNOSIS — R5383 Other fatigue: Secondary | ICD-10-CM | POA: Diagnosis not present

## 2022-09-08 DIAGNOSIS — E559 Vitamin D deficiency, unspecified: Secondary | ICD-10-CM | POA: Insufficient documentation

## 2022-09-08 DIAGNOSIS — E7849 Other hyperlipidemia: Secondary | ICD-10-CM

## 2022-09-08 DIAGNOSIS — E669 Obesity, unspecified: Secondary | ICD-10-CM

## 2022-09-08 DIAGNOSIS — Z0000 Encounter for general adult medical examination without abnormal findings: Secondary | ICD-10-CM

## 2022-09-08 DIAGNOSIS — Z6836 Body mass index (BMI) 36.0-36.9, adult: Secondary | ICD-10-CM

## 2022-09-09 ENCOUNTER — Encounter: Payer: Self-pay | Admitting: Bariatrics

## 2022-09-09 LAB — INSULIN, RANDOM: INSULIN: 9.5 u[IU]/mL (ref 2.6–24.9)

## 2022-09-09 LAB — VITAMIN B12: Vitamin B-12: 304 pg/mL (ref 232–1245)

## 2022-09-09 NOTE — Progress Notes (Signed)
Chief Complaint:   Tonya [name] [name] (MR# [id number]) is a 37 y.o. female who presents for evaluation and treatment of obesity and related comorbidities. Current BMI is Body mass index is 36.32 kg/m. Tonya [name] [name] has been struggling with her weight for many years and has been unsuccessful in either losing weight, maintaining weight loss, or reaching her healthy weight goal.  She met with Everardo Pacific, FNP, 08/31/2022.  She is here for her 1st visit.  She is not a picky eater.  She states that she loves carbohydrates.   Tonya [name] [name] is currently in the action stage of change and ready to dedicate time achieving and maintaining a healthier weight. Tonya [name] [name] is interested in becoming our patient and working on intensive lifestyle modifications including (but not limited to) diet and exercise for weight loss.  Tonya [name] [name]'s habits were reviewed today and are as follows: Her family eats meals together, she thinks her family will eat healthier with her, her desired weight loss is 55 lbs, she started gaining weight in 2015, her heaviest weight ever was 235 pounds, she is a picky eater and doesn't like to eat healthier foods, she has significant food cravings issues, she skips meals frequently, she is frequently drinking liquids with calories, she frequently makes poor food choices, she has problems with excessive hunger, she has binge eating behaviors, and she struggles with emotional eating.  Depression Screen Tonya [name] [name]'s Food and Mood (modified PHQ-9) score was 14.  Subjective:   1. Other fatigue Tonya [name] admits to daytime somnolence and admits to waking up still tired. Patient has a history of symptoms of daytime fatigue, morning fatigue, and morning headache. Tonya [name] [name] generally gets 8 hours of sleep per night, and states that she has nightime awakenings. Snoring is present. Apneic episodes are present. Epworth Sleepiness Score is 14.   2. SOB (shortness of breath) on exertion Tonya [name] [name] notes  increasing shortness of breath with exercising and seems to be worsening over time with weight gain. She notes getting out of breath sooner with activity than she used to. This has not gotten worse recently. Tonya [name] [name] denies shortness of breath at rest or orthopnea.  3. Other hyperlipidemia Patient is taking Lipitor.  Patient's last labs were slightly high, change in medication has been on Crestor.  4. Health care maintenance Obesity.  5. Essential hypertension Blood pressure elevated today.  Patient is taking Cozaar and HCTZ.  Blood pressure 133/90.  6. Elevated glucose Patient last A1c was 5.4.  7. B12 deficiency Patient is not taking any vitamins.  8. Vitamin D deficiency Patient is taking vitamin D.  Patient last vitamin D level 17.4  Assessment/Plan:   1. Other fatigue Tonya [name] [name] does feel that her weight is causing her energy to be lower than it should be. Fatigue may be related to obesity, depression or many other causes. Labs will be ordered, and in the meanwhile, Tonya [name] [name] will focus on self care including making healthy food choices, increasing physical activity and focusing on stress reduction.  - EKG 12-Lead  2. SOB (shortness of breath) on exertion Tonya [name] [name] does feel that she gets out of breath more easily that she used to when she exercises. Tonya [name] [name]'s shortness of breath appears to be obesity related and exercise induced. She has agreed to work on weight loss and gradually increase exercise to treat her exercise induced shortness of breath. Will continue to monitor closely.  3. Other hyperlipidemia Continue current Lipitor.  4. Health care maintenance Check labs, IC, EKG today.  - Insulin, random -  Vitamin B12  5. Essential hypertension Continue medication.  Limit sodium to 1500 mg/day.  6. Elevated glucose Check labs today.  - Insulin, random  7. B12 deficiency Check labs today.  - Vitamin B12  8. Vitamin D deficiency Continue high-dose vitamin D.  9.  Depression screening Tonya [name] [name] had a positive depression screening. Depression is commonly associated with obesity and often results in emotional eating behaviors. We will monitor this closely and work on CBT to help improve the non-hunger eating patterns. Referral to Psychology may be required if no improvement is seen as she continues in our clinic.  10. Generalized obesity  11. BMI 36.0-36.9,adult Tonya [name] [name] is currently in the action stage of change and her goal is to continue with weight loss efforts. I recommend Tonya [name] [name] begin the structured treatment plan as follows:  She has agreed to the Category 3 Plan. 1.  Meal planning. 2.  Intentional eating. 3.  Labs on 224 reviewed.  Glucose, lipids, CBC, CMP, A1c, TSH, vitamin D. 4.  No sugary drinks.  Exercise goals: All adults should avoid inactivity. Some physical activity is better than none, and adults who participate in any amount of physical activity gain some health benefits.   Behavioral modification strategies: increasing lean protein intake, decreasing simple carbohydrates, increasing vegetables, increasing water intake, decreasing eating out, no skipping meals, meal planning and cooking strategies, keeping healthy foods in the home, and planning for success.  She was informed of the importance of frequent follow-up visits to maximize her success with intensive lifestyle modifications for her multiple health conditions. She was informed we would discuss her lab results at her next visit unless there is a critical issue that needs to be addressed sooner. Tonya [name] [name] agreed to keep her next visit at the agreed upon time to discuss these results.  Objective:   Blood pressure (!) 133/90, pulse 68, temperature 98 F (36.7 C), height 5\' 6"  (1.676 m), weight 225 lb (102.1 kg), last menstrual period 03/02/2017, SpO2 100 %. Body mass index is 36.32 kg/m.  EKG: Normal sinus rhythm, rate 71 bpm.  Indirect Calorimeter completed today shows a VO2 of  284 and a REE of 1958.  Her calculated basal metabolic rate is [id number] thus her basal metabolic rate is better than expected.  General: Cooperative, alert, well developed, in no acute distress. HEENT: Conjunctivae and lids unremarkable. Cardiovascular: Regular rhythm.  Lungs: Normal work of breathing. Neurologic: No focal deficits.   Lab Results  Component Value Date   CREATININE 0.71 11/28/2021   BUN 12 11/28/2021   NA 138 11/28/2021   K 3.9 11/28/2021   CL 101 11/28/2021   CO2 24 11/28/2021   Lab Results  Component Value Date   ALT 17 11/28/2021   AST 19 11/28/2021   ALKPHOS 81 11/28/2021   BILITOT 0.7 11/28/2021   Lab Results  Component Value Date   HGBA1C 5.0 06/14/2021   Lab Results  Component Value Date   INSULIN 9.5 09/08/2022   Lab Results  Component Value Date   TSH 1.918 06/13/2021   Lab Results  Component Value Date   CHOL 251 (H) 06/13/2021   HDL 41 06/13/2021   LDLCALC 181 (H) 06/13/2021   LDLDIRECT 141.9 01/06/2012   TRIG 146 06/13/2021   CHOLHDL 6.1 06/13/2021   Lab Results  Component Value Date   WBC 12.8 (H) 11/28/2021   HGB 15.9 (H) 11/28/2021   HCT 46.6 (H) 11/28/2021   MCV 87.8 11/28/2021   PLT 349 11/28/2021   No results  found for: "IRON", "TIBC", "FERRITIN"  Attestation Statements:   Reviewed by clinician on day of visit: allergies, medications, problem list, medical history, surgical history, family history, social history, and previous encounter notes.  Delfina Redwood, am acting as Location manager for [employer], DO.  I have reviewed the above documentation for accuracy and completeness, and I agree with the above. Jearld Lesch, DO

## 2022-09-22 ENCOUNTER — Ambulatory Visit: Payer: PRIVATE HEALTH INSURANCE | Admitting: Bariatrics

## 2022-09-23 ENCOUNTER — Encounter: Payer: Self-pay | Admitting: Nurse Practitioner

## 2022-09-23 ENCOUNTER — Ambulatory Visit: Payer: PRIVATE HEALTH INSURANCE | Admitting: Nurse Practitioner

## 2022-09-23 ENCOUNTER — Telehealth: Payer: Self-pay

## 2022-09-23 VITALS — BP 137/87 | HR 79 | Temp 98.1°F | Ht 66.0 in | Wt 221.0 lb

## 2022-09-23 DIAGNOSIS — E538 Deficiency of other specified B group vitamins: Secondary | ICD-10-CM | POA: Diagnosis not present

## 2022-09-23 DIAGNOSIS — I10 Essential (primary) hypertension: Secondary | ICD-10-CM

## 2022-09-23 DIAGNOSIS — E7849 Other hyperlipidemia: Secondary | ICD-10-CM

## 2022-09-23 DIAGNOSIS — E669 Obesity, unspecified: Secondary | ICD-10-CM

## 2022-09-23 DIAGNOSIS — Z6835 Body mass index (BMI) 35.0-35.9, adult: Secondary | ICD-10-CM

## 2022-09-23 DIAGNOSIS — I63441 Cerebral infarction due to embolism of right cerebellar artery: Secondary | ICD-10-CM | POA: Diagnosis not present

## 2022-09-23 MED ORDER — WEGOVY 0.25 MG/0.5ML ~~LOC~~ SOAJ
0.2500 mg | SUBCUTANEOUS | 0 refills | Status: AC
Start: 2022-09-23 — End: ?

## 2022-09-23 NOTE — Patient Instructions (Signed)
What is a GLP-1 Glucagon like peptide-1 (GLP-1) agonists represent a class of medications used to treat type 2 diabetes mellitus and obesity.  GLP-1 medications mimic the action of a hormone called glucagon like peptide 1.  When blood sugar levels start to rise/increase these drugs stimulate the body to produce more insulin.  When that happens, the extra insulin helps to lower the blood sugar levels in the body.  This in returns helps with decreasing cravings.  These medications also slow the movement of food from the stomach into the small intestine.  This in return helps one to full faster and longer.   Diabetic medications: Approved for treatment of diabetes mellitus but does not have full approval for weight loss use Victoza (liraglutide) Ozempic (semaglutide) Mounjaro Trulicity Rybelsus  Weight loss medications: Approved for long-term weight loss use.        Saxenda (liraglutide) Wegovy (semaglutide) (SELECT study) Zepbound Contraindications:  Pancreatitis (active gallstones) Medullary thyroid cancer High triglycerides (>500)-will need labs prior to starting Multiple Endocrine Neoplasia syndrome type 2 (MEN 2) Trying to get pregnant Breastfeeding Use with caution with taking insulin or sulfonylureas (will need to monitor blood sugars for hypoglycemia) Side effects (most common): Most common side effects are nausea, gas, bloating and constipation.  Other possible side effects are headaches, belching, diarrhea, tiredness (fatigue), vomiting, upset stomach, dizziness, heartburn and stomach (abdominal pain).  If you think that you are becoming dehydrated, please inform our office or your primary family provider.  Stop immediately and go to ER if you have any symptoms of a serious allergic reaction including swelling of your face, lips, tongue or throat; problems breathing or swallowing; severe rash or itching; fainting or feeling dizzy; or very rapid heart rate.                    Steps  to starting your [facility]  The office staff will send a prior authorization request to your insurance company for approval. We will send you a mychart message once we hear back from your insurance with a decision.  This can take up to 7-10 business days.   Once your WegovyTis approved, you may then pick up [facility] pen from your pharmacy.    Learn how to do Wegovy injections on the [employer] website. There is a training video that will walk you through how to safely perform the injection. If you have questions for our clinical staff, please contact our  clinical staff. If you have any symptoms of allergic reaction to [facility] discontinue immediately and call 911.  1. What should I tell my provider before using WegovyT ? have or have had problems with your pancreas or kidneys. have type 2 diabetes and a history of diabetic retinopathy. have or have had depression, suicidal thoughts, or mental health issues. are pregnant or plan to become pregnant. Joesphine Bare may harm your unborn baby. You should stop using WegovyT 3 months before you plan to become pregnant or if you are breastfeeding or plan to breastfeed. It is not known if WegovyT passes into your breast milk.  2. What is Joesphine Bare and how does it work?  Joesphine Bare is an injectable prescription medication prescribed by your provider to help with your weight loss.  This medicine will be most effective when combined with a reduced calorie diet and physical activity.  Joesphine Bare is not for the treatment of type 2 diabetes mellitus. Joesphine Bare should not be used with other GLP-1 receptor agonist medicines. The addition of WegovyT in  patients treated with insulin has not been evaluated. When initiating WegovyT, consider reducing the dose of concomitantly administered insulin secretagogues (such as sulfonylureas) or insulin to reduce the risk of  hypoglycemia.  One role of GLP-1 is to send a signal to your brain to tell it you are full. It also slows down  stomach emptying which will make you feel full longer and may help with reducing cravings.   3.  How should I take WegovyT?  Administer WegovyT once weekly, on the same day each week, at any time of day, with or without meals Inject subcutaneously in the abdomen, thigh or upper arm Initiate at 0.25 mg once weekly for 4 weeks. In 4 week intervals, increase the dose until a dose of 2.4 mg is reached (we will discuss with you the dosage at each visit). The maintenance dose of WegovyT is 2.4 mg once weekly.  The dosing schedule of Wegovy is:  0.25 mg per week X 4 weeks 0.5 mg per week X 4 weeks 1.0 mg per week X 4 weeks 1.7 mg per week X 4 weeks 2.4 mg per week   Missed dose   If you miss your injection day, go ahead inject your current dose. You can go >7 days, but not <7 days between injections. You may change your injection day (It must be >7 days). If you miss >2 doses, you can still keep next injection dose the same or follow de-escalation schedule which may minimize GI symptoms.   In patients with type 2 diabetes, monitor blood glucose prior to starting and during WEGOVYT treatment.   Inject your dose of Wegovy under the skin (subcutaneous injection) in your stomach area (abdomen), upper leg (thigh) or upper arm. Do not inject into a vein or a muscle. The injection site should be rotated and not given in the same spot each day. Hold the needle under the skin and count to "10". This will allow all of the medicine to be dispensed under the skin. Always wipe your skin with an alcohol prep pad before injection  Dispose of used pen in an approved sharps container. More practical options that can be put in the trash  to go to the landfill are milk jugs or plastic laundry detergent containers with a screw on lid.  What side effects may I notice from taking WegovyT?  Side effects that usually do not require medical attention (report to our office if they continue or are bothersome): Nausea  (most common but decreases over time in most people as their body gets used to the medicine) Diarrhea Constipation (you may take an over the counter laxative if needed) Headache Decreased appetite Upset stomach Tiredness Dizziness Feeling bloated Hair loss Belching Gas Heartburn  Side effects that you should call 911 as soon as possible Vomiting Stomach pain Fever Yellowing of your skin or eyes  Clay-colored stools Increased heart rate while at rest Low blood sugar  Sudden changes in mood, behaviors, thoughts, feelings, or thoughts of suicide If you get a lump or swelling in your neck, hoarseness, trouble swallowing, or shortness of breath. Allergic reaction such as skin rash, itching, hives, swelling of the face, tongue, or lips  Helpful tips for managing nausea Nausea is a common side effect when first starting WegovyT. If you experience nausea, be sure to connect with your health care provider. He or she will offer guidance on ways to manage it, which may include: Eat bland, low-fat foods, like crackers, toast and rice  Eat foods that contain water, like soups and gelatin  Avoid lying down after you eat  Go outdoors for fresh air  Eat more slowly    Other important information Do not drop your pen or knock it against hard surfaces  Do not expose your pen to any liquids  If you think that your pen may be damaged, do not try to fix it. Use a new one Keep the pen cap on until you are ready to inject. Your pen will no longer be sterile if you store an unused pen without the cap, if you pull the pen cap off and put it on again, or if the pen cap is missing. This could lead to an infection  Store the [place] pen in the refrigerator from 53F to 76F (2C to 8C) If needed, before removing the pen cap, WegovyT can be stored from 8C to 30C (76F to 26F) in the original carton for up to 28 days.  Keep WegovyT in the original carton to protect it from light  Do not freeze   Throw away pen if WegovyT has been frozen, has been exposed to light or temperatures above 26F (30C), or has been out of the refrigerator for 28 days or longer It's important to properly dispose of your used WegovyT pens. Do not throw the pen away in your household trash. Instead, use an FDA-cleared sharps disposable container or a sturdy household container with a tight-fitting lid, like a heavy duty plastic container.   YQMVHQ pen training website: [employer]astyThought[employer]uy  Wegovy savings and support link: achegone.com

## 2022-09-23 NOTE — Progress Notes (Signed)
Office: [phone]  /  Fax: [phone]  WEIGHT SUMMARY AND BIOMETRICS  Weight Lost Since Last Visit: 4lb  No data recorded  Vitals Temp: 98.1 F (36.7 C) BP: 137/87 Pulse Rate: 79 SpO2: 98 %   Anthropometric Measurements Height:  (1.676 m) Weight: 221 lb (100.2 kg) BMI (Calculated): 35.69 Weight at Last Visit: 225lb Weight Lost Since Last Visit: 4lb Starting Weight: 225lb Total Weight Loss (lbs): 4 lb (1.814 kg)   Body Composition  Body Fat %: 43.2 % Fat Mass (lbs): 95.8 lbs Muscle Mass (lbs): 119.4 lbs Total Body Water (lbs): 87 lbs Visceral Fat Rating : 10   Other Clinical Data Fasting: No Today's Visit #: 2 Starting Date: 09/08/22     HPI  Chief Complaint: OBESITY  Tonya [name] is here to discuss her progress with her obesity treatment plan. She is on the the Category 3 Plan and states she is following her eating plan approximately 100 % of the time. She states she is exercising 30 minutes 1-2 days per week.   Interval History:  Since last office visit she has lost 4 pounds.  She is doing well with the meal plan and meal prepping. She is eating more vegetables.  When she eats out she eats a protein and a vegetable-has eaten out 2 times since her last visit.  She is cooking more at home.  She is snacking on protein.  She is drinking more water and is drinking 2 diet sodas daily.  Denies hunger or cravings.     Pharmacotherapy for weight loss: She is not currently taking medications  for medical weight loss.   Previous pharmacotherapy for medical weight loss:  Phentermine    Bariatric surgery:  Patient has not had bariatric surgery.    Low Vit B12 Currently not on Vit B12.  Has done Vit B12 injections in the past.    Hyperlipidemia Medication(s): Crestor. Denies side effects.  Was recently changed from Lipitor to Crestor by her PCP due to side effects of myalgia.  Cardiovascular risk factors: dyslipidemia, hypertension, and obesity (BMI >= 30  kg/m2)  Lab Results  Component Value Date   CHOL 251 (H) 06/13/2021   HDL 41 06/13/2021   LDLCALC 181 (H) 06/13/2021   LDLDIRECT 141.9 01/06/2012   TRIG 146 06/13/2021   CHOLHDL 6.1 06/13/2021   Lab Results  Component Value Date   ALT 17 11/28/2021   AST 19 11/28/2021   ALKPHOS 81 11/28/2021   BILITOT 0.7 11/28/2021   The ASCVD Risk score (Arnett DK, et al., 2019) failed to calculate for the following reasons:   The 2019 ASCVD risk score is only valid for ages 51 to 18   The patient has a prior MI or stroke diagnosis   Hypertension?CVA Medication(s): Cozaar  and HCTZ .  Denies side effects.   Denies chest pain, palpitations and SOB. Seeing cardiology and neurology.   BP Readings from Last 3 Encounters:  09/23/22 137/87  09/08/22 (!) 133/90  08/31/22 125/84   Lab Results  Component Value Date   CREATININE 0.71 11/28/2021   CREATININE 0.62 07/29/2021   CREATININE 0.53 06/13/2021     PHYSICAL EXAM:  Blood pressure 137/87, pulse 79, temperature 98.1 F (36.7 C), height  (1.676 m), weight 221 lb (100.2 kg), last menstrual period 03/02/2017, SpO2 98 %. Body mass index is 35.67 kg/m.  General: She is overweight, cooperative, alert, well developed, and in no acute distress. PSYCH: Has normal mood, affect and thought process.  Extremities: No edema.  Neurologic: No gross sensory or motor deficits. No tremors or fasciculations noted.    DIAGNOSTIC DATA REVIEWED:  BMET    Component Value Date/Time   NA 138 11/28/2021 2044   NA 143 07/29/2021 1041   K 3.9 11/28/2021 2044   CL 101 11/28/2021 2044   CO2 24 11/28/2021 2044   GLUCOSE 105 (H) 11/28/2021 2044   BUN 12 11/28/2021 2044   BUN 9 07/29/2021 1041   CREATININE 0.71 11/28/2021 2044   CALCIUM 9.6 11/28/2021 2044   GFRNONAA >60 11/28/2021 2044   GFRAA >60 04/10/2019 0544   Lab Results  Component Value Date   HGBA1C 5.0 06/14/2021   Lab Results  Component Value Date   INSULIN 9.5  09/08/2022   Lab Results  Component Value Date   TSH 1.918 06/13/2021   CBC    Component Value Date/Time   WBC 12.8 (H) 11/28/2021 2044   RBC 5.31 (H) 11/28/2021 2044   HGB 15.9 (H) 11/28/2021 2044   HGB 13.6 07/29/2021 1041   HCT 46.6 (H) 11/28/2021 2044   HCT 40.3 07/29/2021 1041   PLT 349 11/28/2021 2044   PLT 345 07/29/2021 1041   MCV 87.8 11/28/2021 2044   MCV 89 07/29/2021 1041   MCH 29.9 11/28/2021 2044   MCHC 34.1 11/28/2021 2044   RDW 11.9 11/28/2021 2044   RDW 12.7 07/29/2021 1041   Iron Studies No results found for: "IRON", "TIBC", "FERRITIN", "IRONPCTSAT" Lipid Panel     Component Value Date/Time   CHOL 251 (H) 06/13/2021 1458   TRIG 146 06/13/2021 1458   HDL 41 06/13/2021 1458   CHOLHDL 6.1 06/13/2021 1458   VLDL 29 06/13/2021 1458   LDLCALC 181 (H) 06/13/2021 1458   LDLDIRECT 141.9 01/06/2012 0922   Hepatic Function Panel     Component Value Date/Time   PROT 7.5 11/28/2021 2044   ALBUMIN 4.2 11/28/2021 2044   AST 19 11/28/2021 2044   ALT 17 11/28/2021 2044   ALKPHOS 81 11/28/2021 2044   BILITOT 0.7 11/28/2021 2044   BILIDIR 0.0 01/06/2012 0922      Component Value Date/Time   TSH 1.918 06/13/2021 1458   TSH 1.01 01/06/2012 1610   Nutritional Lab Results  Component Value Date   VD25OH 26.0 (L) 07/29/2021     ASSESSMENT AND PLAN  TREATMENT PLAN FOR OBESITY:  Recommended Dietary Goals  Nadean is currently in the action stage of change. As such, her goal is to continue weight management plan. She has agreed to keeping a food journal and adhering to recommended goals of 1500 calories and 90 protein.  Behavioral Intervention  We discussed the following Behavioral Modification Strategies today: increasing lean protein intake, decreasing simple carbohydrates , increasing vegetables, increasing lower glycemic fruits, increasing fiber rich foods, increasing water intake, continue to practice mindfulness when eating, and planning for  success.  Additional resources provided today: NA  Recommended Physical Activity Goals  Adiva has been advised to work up to 150 minutes of moderate intensity aerobic activity a week and strengthening exercises 2-3 times per week for cardiovascular health, weight loss maintenance and preservation of muscle mass.   She has agreed to Think about ways to increase physical activity and Work on scheduling and tracking physical activity.    Pharmacotherapy We discussed various medication options to help Mitsy with her weight loss efforts and we both agreed to start [facility] 0.25mg  due to history of CVA, HLD, HTN. See SELECT study.  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  Start Vit B12 OTC.  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  Dispense: 2 mL; Refill: 0 Continue to follow up with neurology, cardiology and PCP  Essential hypertension -     MJ.Hock; Inject 0.25 mg into the skin once a week.  Dispense: 2 mL; Refill: 0 Continue to follow up with cardiology and PCP  Other hyperlipidemia -     HQIONG; Inject 0.25 mg into the skin once a week.  Dispense: 2 mL; Refill: 0              Continue to follow up with cardiology and PCP Generalized obesity -     Wegovy; Inject 0.25 mg into the skin once a week.  Dispense: 2 mL; Refill: 0  BMI 35.0-35.9,adult -     EXBMWU; Inject 0.25 mg into the skin once a week.  Dispense: 2 mL; Refill: 0      Start Vit B12  OTC   Return in about 4 weeks (around 10/21/2022).Marland Kitchen She was informed of the importance of frequent follow up visits to maximize her success with intensive lifestyle modifications for her multiple health conditions.   ATTESTASTION STATEMENTS:  Reviewed by clinician on day of visit: allergies, medications, problem list, medical history, surgical history, family history, social history, and previous encounter notes.     Theodis Sato. Ceaser Ebeling FNP-C

## 2022-09-23 NOTE — Telephone Encounter (Signed)
PA form printed from [employer]. Faxed in with today's office note to [phone].

## 2022-09-25 ENCOUNTER — Encounter: Payer: Self-pay | Admitting: Nurse Practitioner

## 2022-09-28 NOTE — Telephone Encounter (Signed)
Received fax from Tonya [name] that [name] has been denied. This medication is not a covered benefit as this plan does not cover medications used for weight loss purposes.

## 2022-10-26 ENCOUNTER — Ambulatory Visit: Payer: PRIVATE HEALTH INSURANCE | Admitting: Nurse Practitioner

## 2022-11-19 ENCOUNTER — Ambulatory Visit: Payer: PRIVATE HEALTH INSURANCE | Admitting: Neurology

## 2023-03-05 IMAGING — CT CT HEAD W/O CM
4 series · 16 of 47 positions shown, 18 images · non-contrast
Comparison: None.

CLINICAL DATA: Headache, hypertension, dizziness, and weakness.

EXAM:
CT HEAD WITHOUT CONTRAST
TECHNIQUE: Contiguous axial images were obtained from the base of the skull
through the vertex without intravenous contrast.

[Series 3: head wo · axial · 0.43mm/px · z∈[-126,-6]mm · 7 of 32 slices shown, 9 images]
[im 4/32  brain]
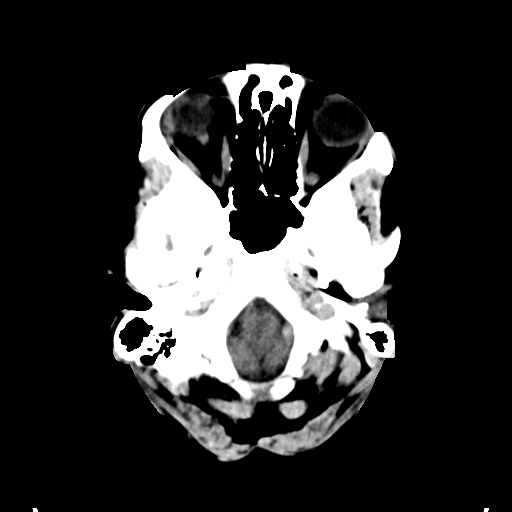
[im 4/32  bone]
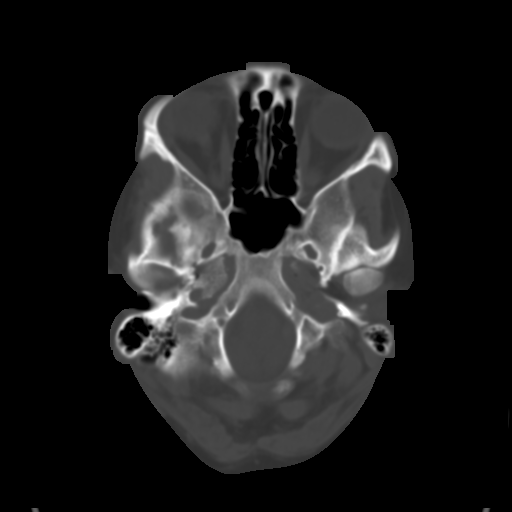
[im 8/32  brain]
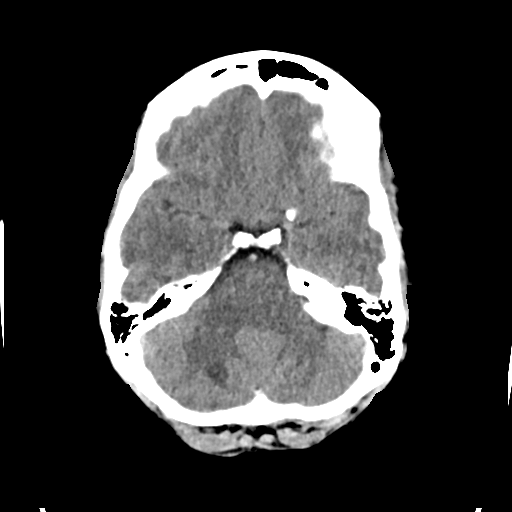
[im 12/32  brain]
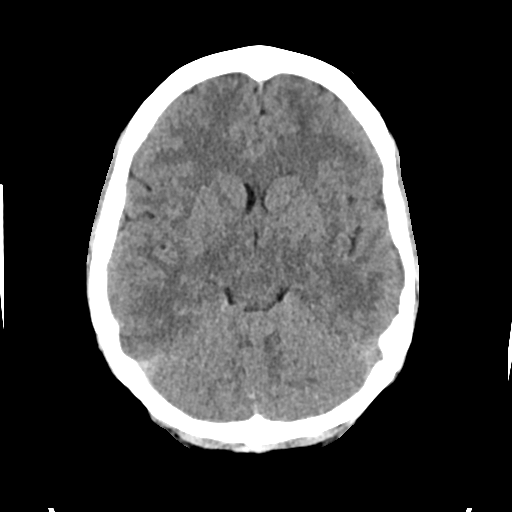
[im 16/32  brain]
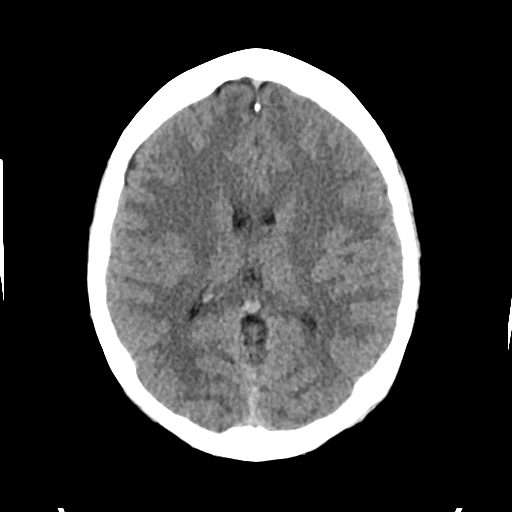
[im 20/32  brain]
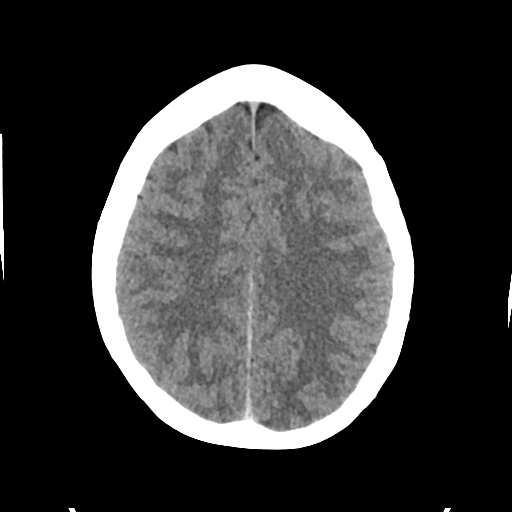
[im 20/32  bone]
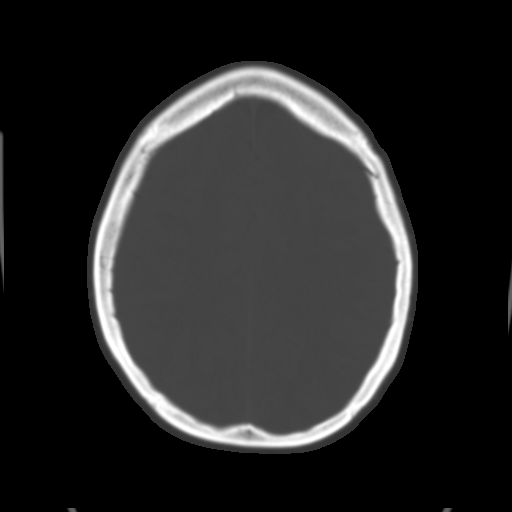
[im 24/32  brain]
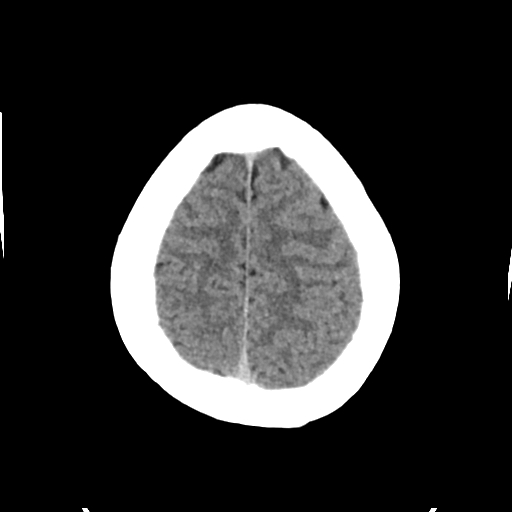
[im 28/32  brain]
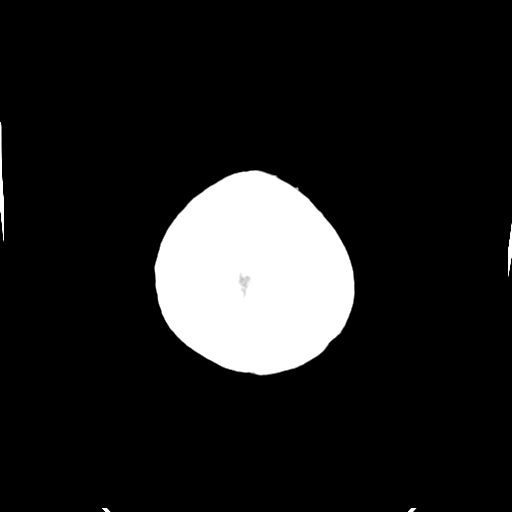

[Series 4: head bone · axial · 0.43mm/px · z∈[-127,-95]mm · 3 of 78 slices shown]
[im 8/78  bone]
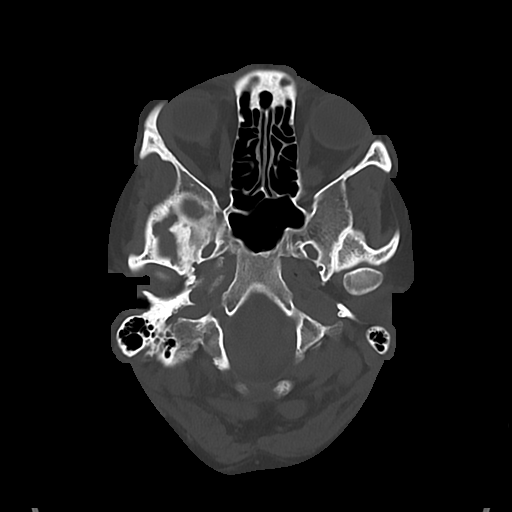
[im 16/78  bone]
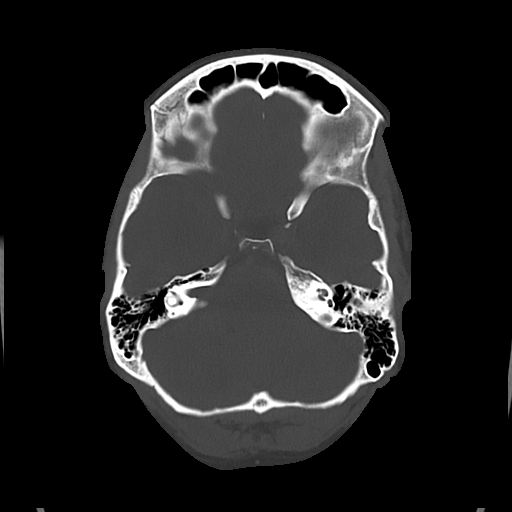
[im 24/78  bone]
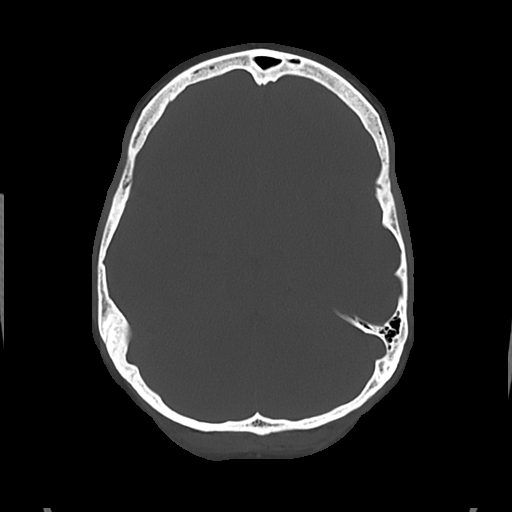

[Series 5: cor soft · coronal · 0.31mm/px · 3 of 65 slices shown]
[im 22/65  brain]
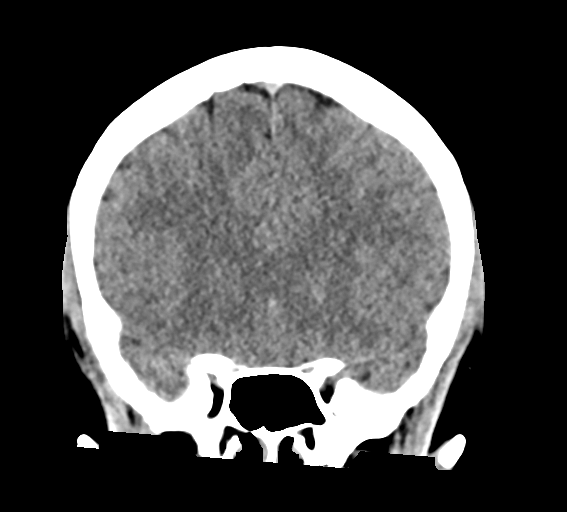
[im 29/65  brain]
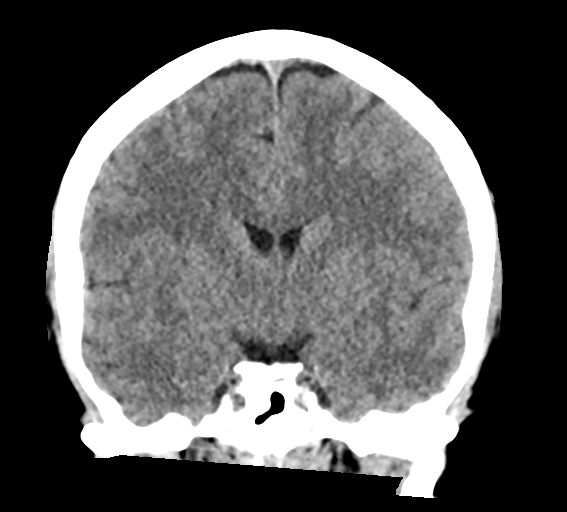
[im 36/65  brain]
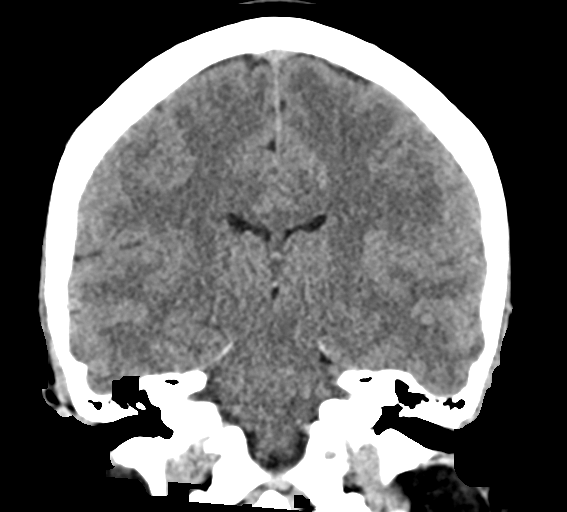

[Series 6: sag soft · sagittal · 0.31mm/px · 3 of 58 slices shown]
[im 20/58  brain]
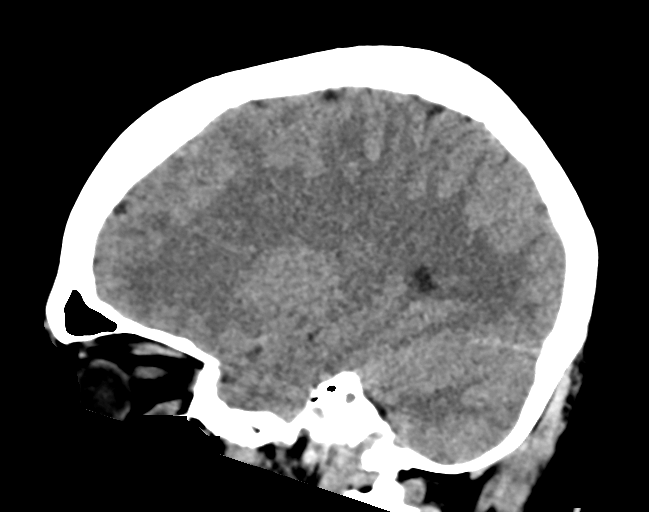
[im 29/58  brain]
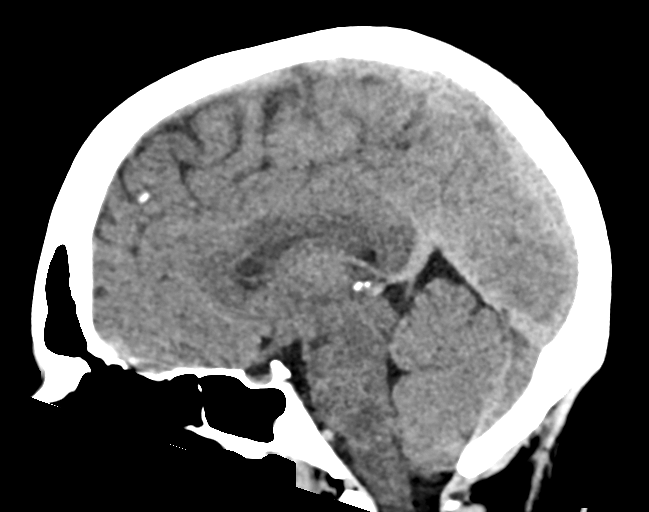
[im 39/58  brain]
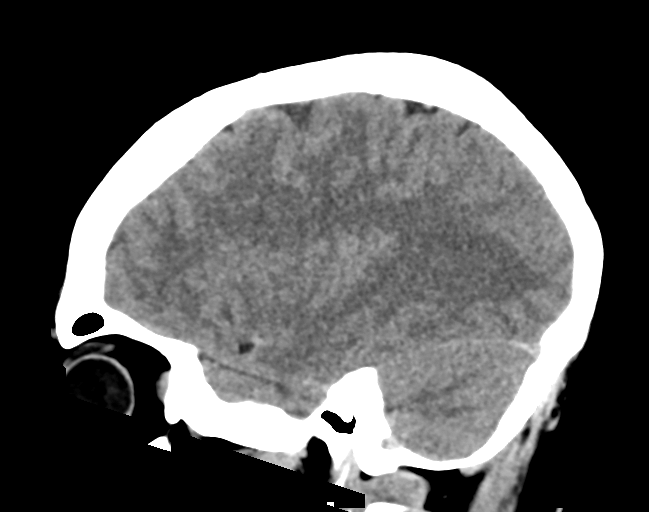

[16 of 47 positions shown; findings below may reference images not displayed]

FINDINGS: Brain: No acute intracranial hemorrhage, midline shift or mass
effect. No extra-axial fluid collection. Gray-white matter
differentiation is within normal limits and there is no
hydrocephalus. There is a wedge-shaped hypodensity in the inferior
aspect of the cerebellum on the right.

Vascular: No hyperdense vessel or unexpected calcification.

Skull: Normal. Negative for fracture or focal lesion.

Sinuses/Orbits: Mild mucosal thickening is present in the right
maxillary sinus, left sphenoid sinus, and bilateral ethmoid air
cells. The orbits are within normal limits.

Other: None.
IMPRESSION: Wedge-shaped hypodensity in the inferior aspect of the cerebellum on
the right, possible infarct of indeterminate age versus other
etiology. MRI with contrast is recommended for further evaluation.

## 2023-03-05 IMAGING — CR DG CHEST 2V
2 series · 2 of 2 positions shown · non-contrast
Comparison: 03/31/2007.

CLINICAL DATA: Chest pain, hypertension.

EXAM:
CHEST - 2 VIEW

[chest pa]
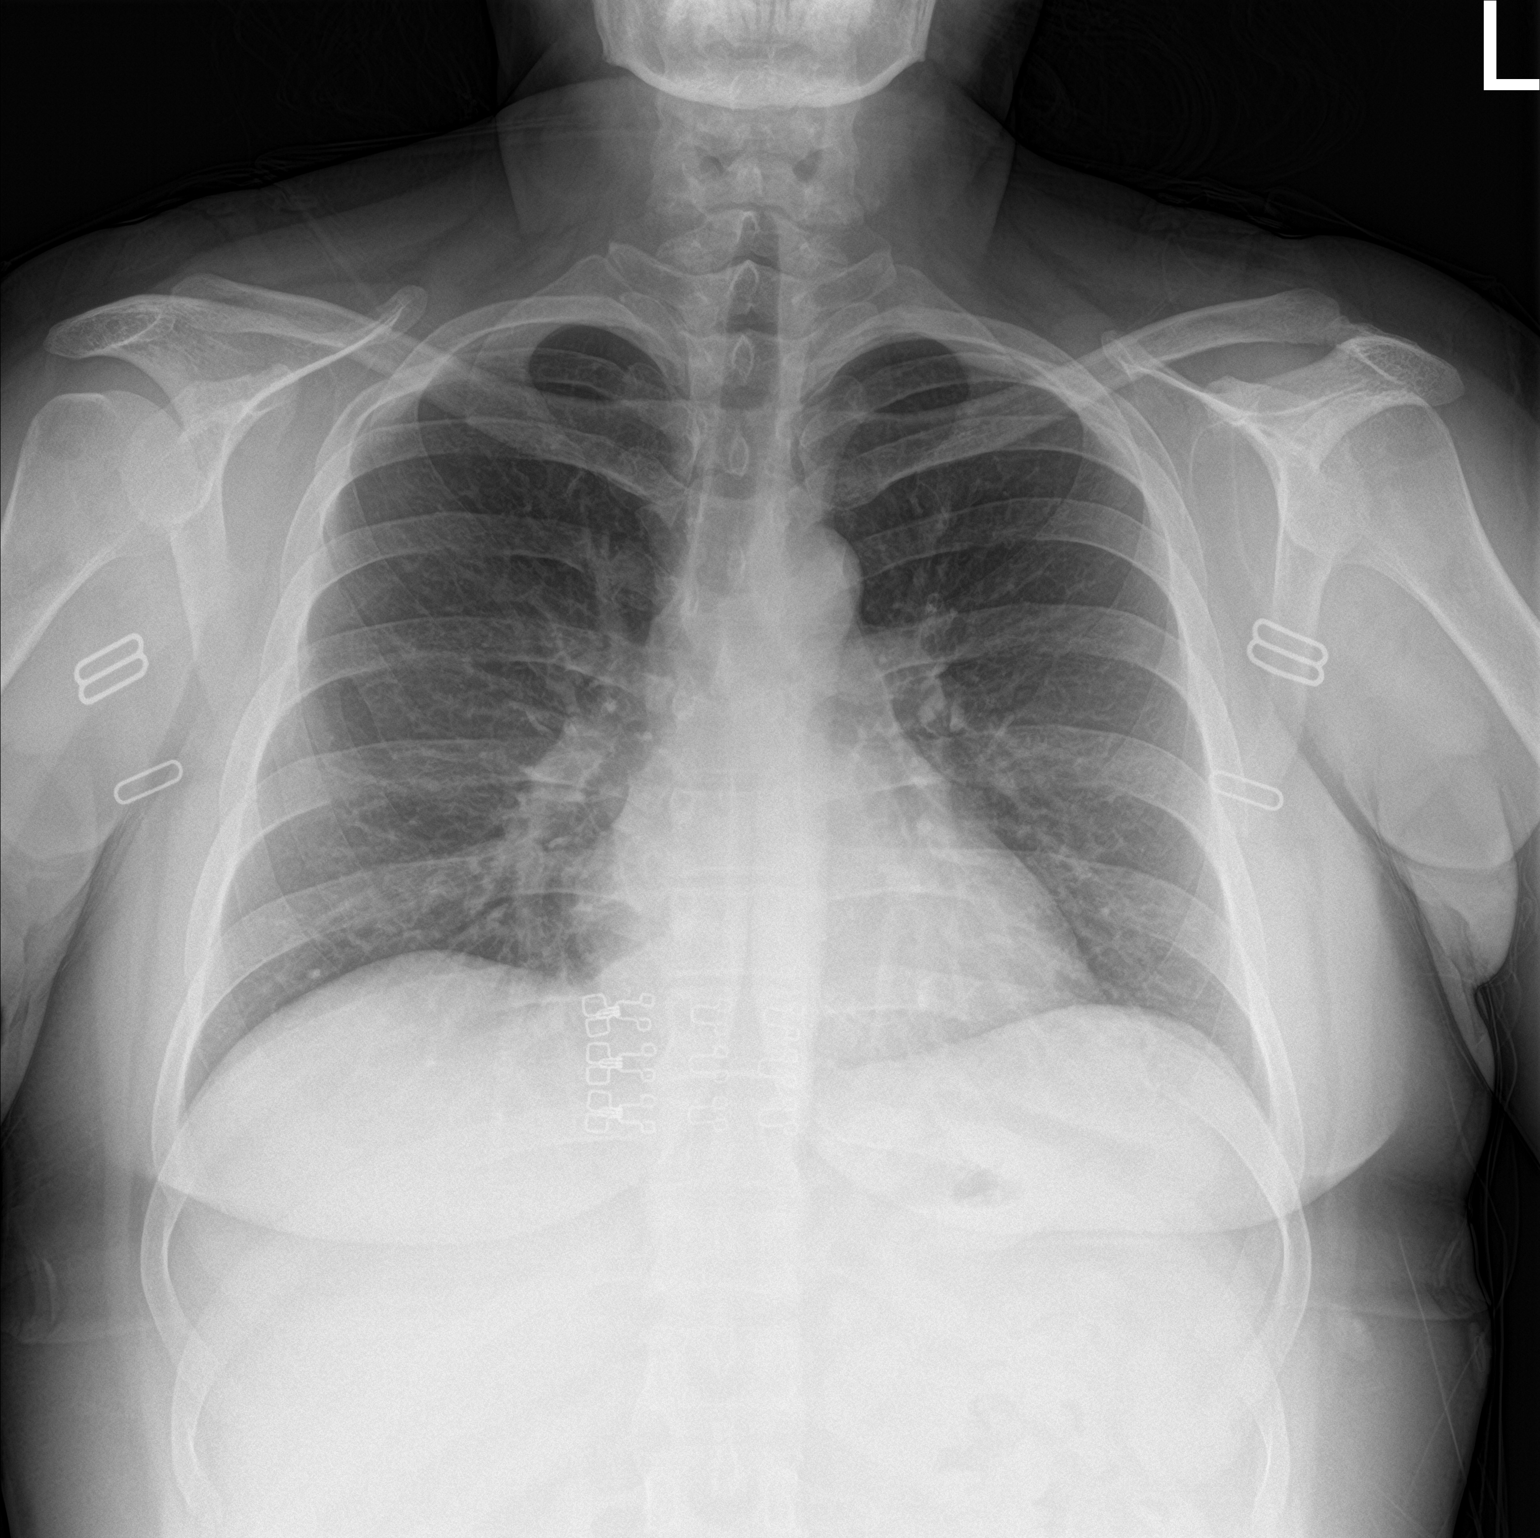

[chest lat]
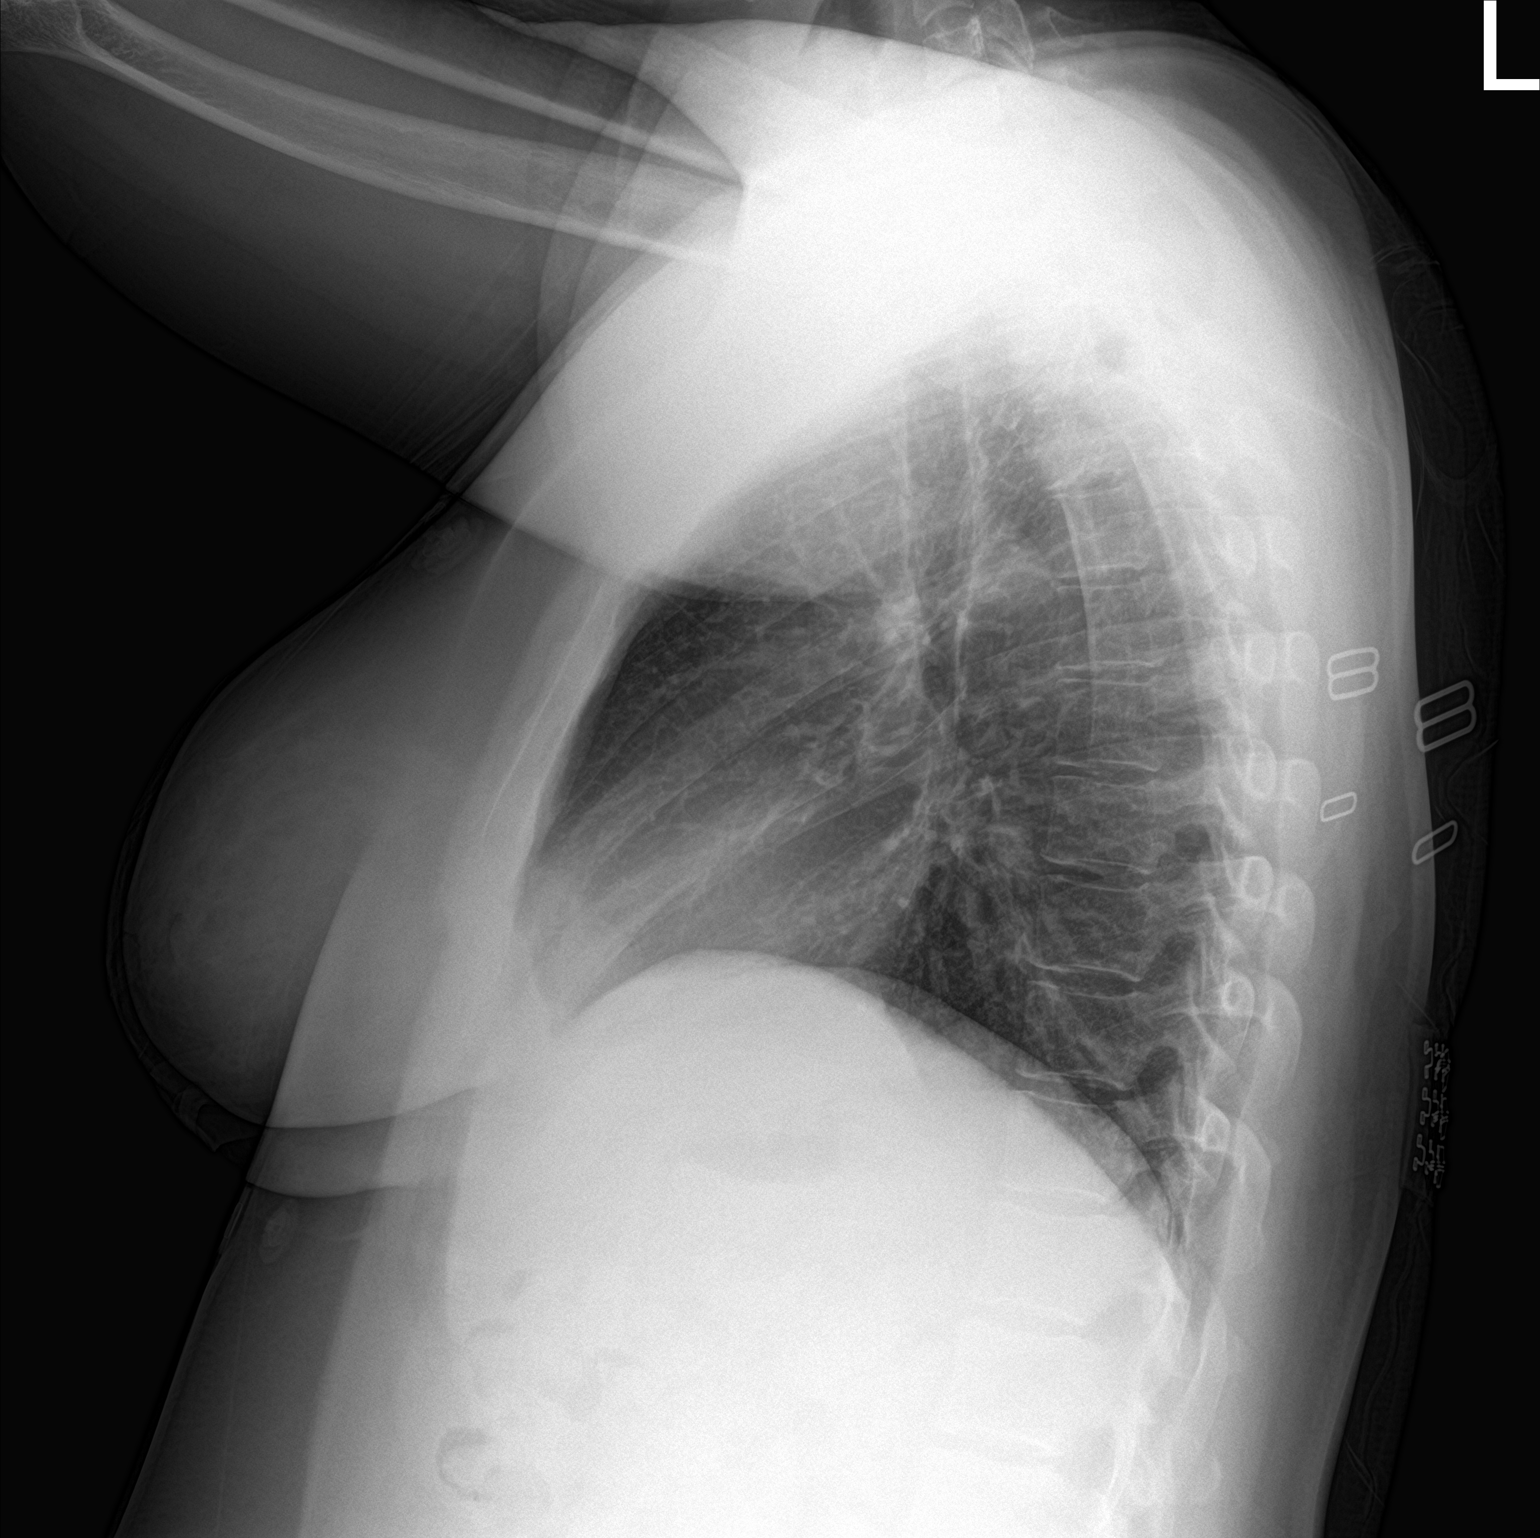

[2 of 2 positions shown; findings below may reference images not displayed]

FINDINGS: The heart size and mediastinal contours are within normal limits.
Both lungs are clear. No acute osseous abnormality.
IMPRESSION: No active cardiopulmonary disease.

## 2023-03-06 IMAGING — MR MR HEAD WO/W CM
9 of 13 series · 34 of 48 positions shown · IV contrast (gadavist)
Comparison: 06/12/2021 CT head.  No prior MRI.

CLINICAL DATA: Dizziness, headache, weakness

EXAM:
MRI HEAD WITHOUT AND WITH CONTRAST
TECHNIQUE: Multiplanar, multiecho pulse sequences of the brain and surrounding
structures were obtained without and with intravenous contrast.
CONTRAST:  9.7mL GADAVIST GADOBUTROL 1 MMOL/ML IV SOLN

[Series 3: DWI · axial · 3.0mm · 1.09mm/px · z∈[-140,+3]mm · 9 of 104 slices shown (1 of 4)]
[im 1/104]
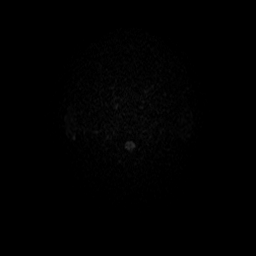
[im 13/104]
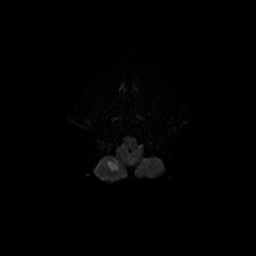
[im 26/104]
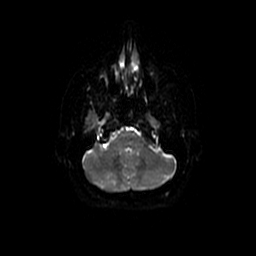
[im 39/104]
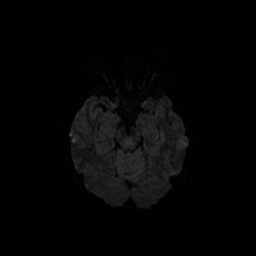
[im 52/104]
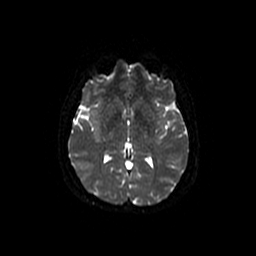
[im 65/104]
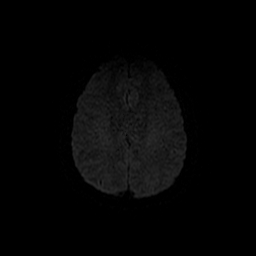
[im 78/104]
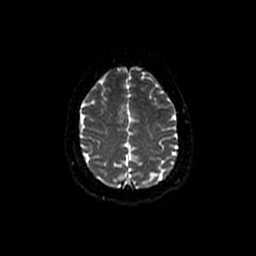
[im 91/104]
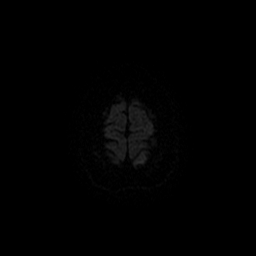
[im 104/104]
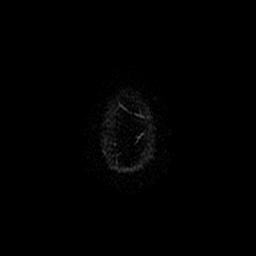

[Series 4: DWI · coronal · 5.0mm · 1.09mm/px · 6 of 72 slices shown (2 of 4)]
[im 1/72]
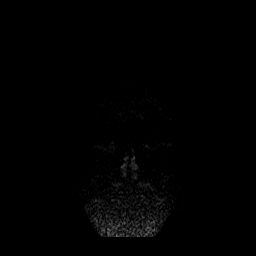
[im 15/72]
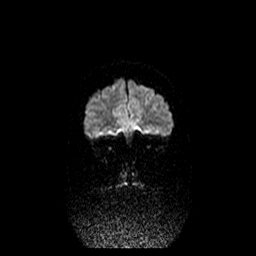
[im 29/72]
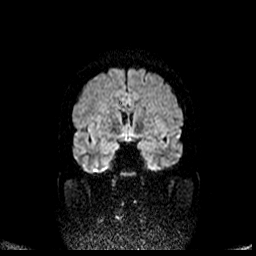
[im 43/72]
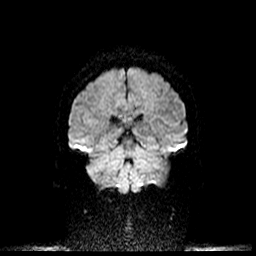
[im 57/72]
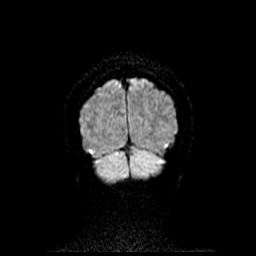
[im 72/72]
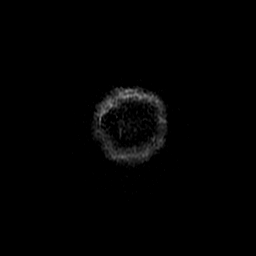

[Series 6: T2 · axial · 5.0mm · 0.43mm/px · z∈[-129,+12]mm · 2 of 26 slices shown]
[im 1/26]
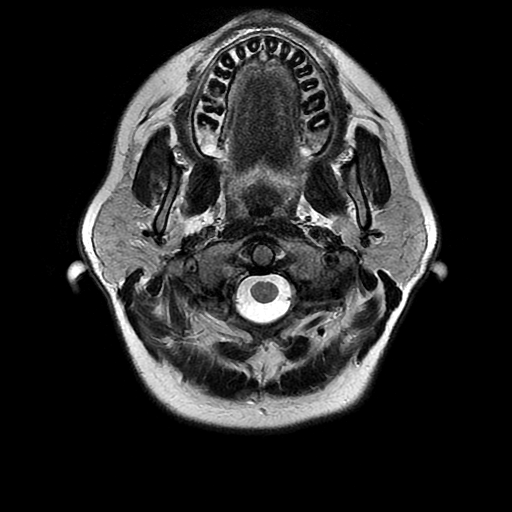
[im 26/26]
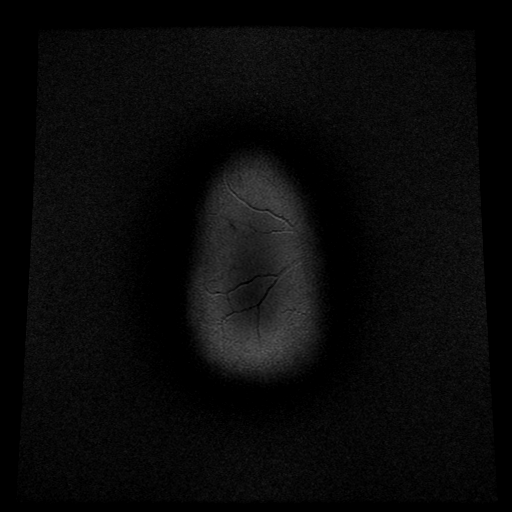

[Series 7: FLAIR · axial · 3.0mm · 0.43mm/px · z∈[-129,+12]mm · 2 of 26 slices shown]
[im 1/26]
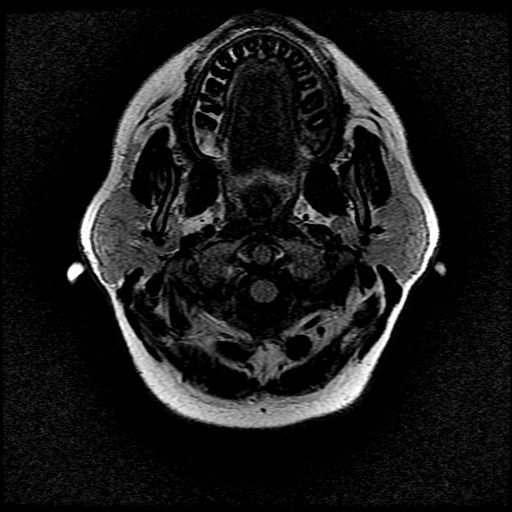
[im 26/26]
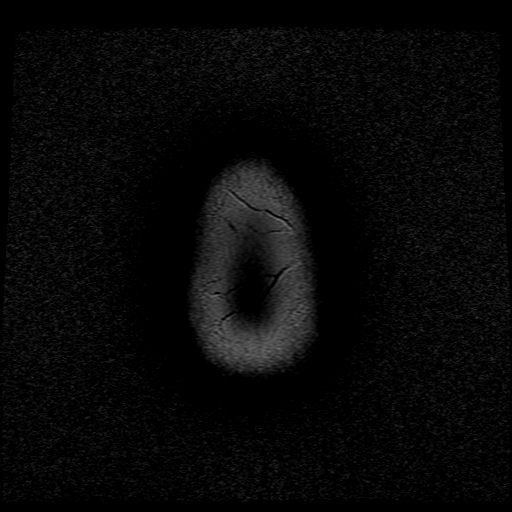

[Series 11: T1 post-contrast · axial · 3.0mm · 0.47mm/px · z∈[-134,+10]mm · 4 of 52 slices shown (1 of 3)]
[im 1/52]
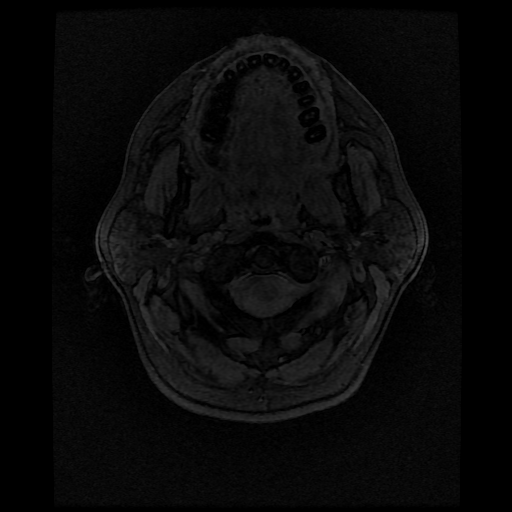
[im 18/52]
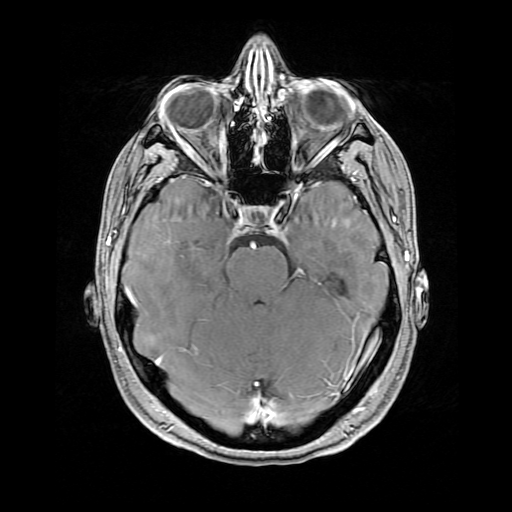
[im 35/52]
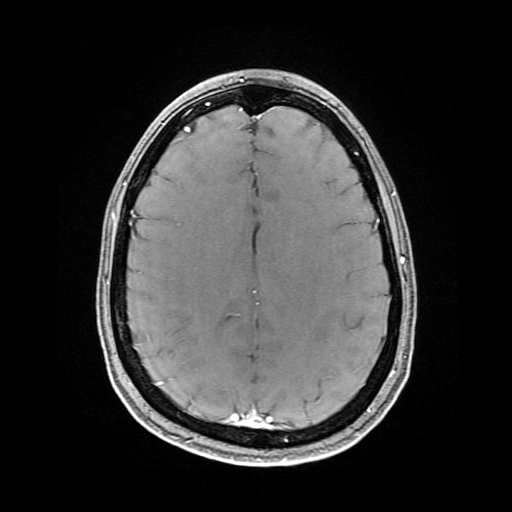
[im 52/52]
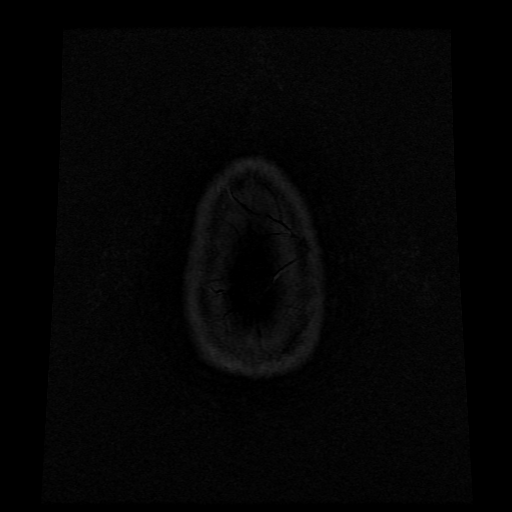

[Series 12: T1 post-contrast · coronal · 5.0mm · 0.39mm/px · 2 of 28 slices shown (2 of 3)]
[im 1/28]
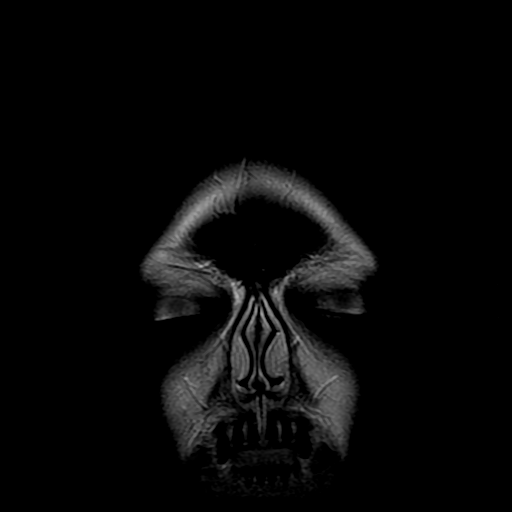
[im 28/28]
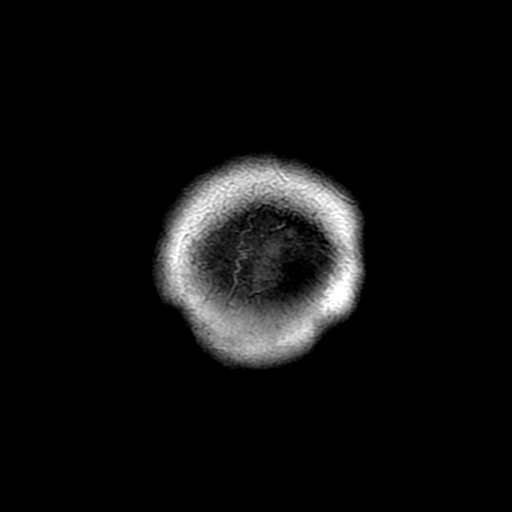

[Series 13: T1 post-contrast · sagittal · 5.0mm · 0.47mm/px · 2 of 25 slices shown (3 of 3)]
[im 1/25]
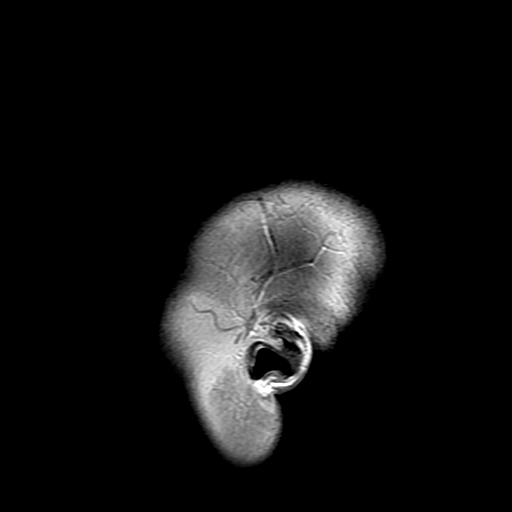
[im 25/25]
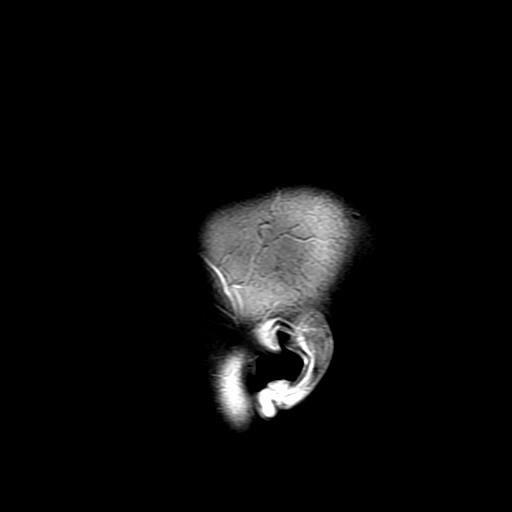

[Series 300: DWI · axial · 3.0mm · 1.09mm/px · z∈[-140,+3]mm · 4 of 52 slices shown (3 of 4)]
[im 1/52]
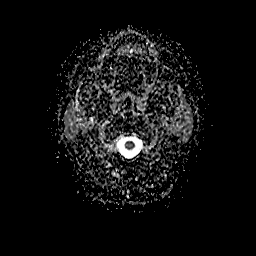
[im 18/52]
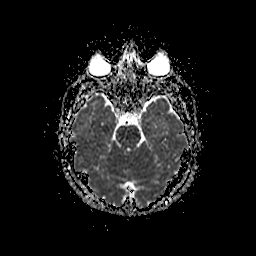
[im 35/52]
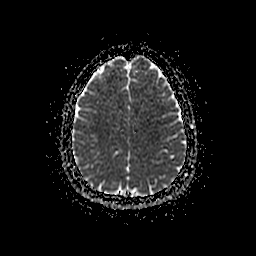
[im 52/52]
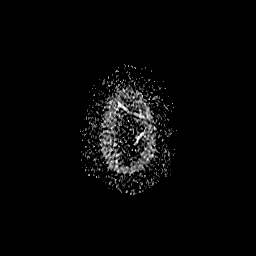

[Series 400: DWI · coronal · 5.0mm · 1.09mm/px · 3 of 36 slices shown (4 of 4)]
[im 1/36]
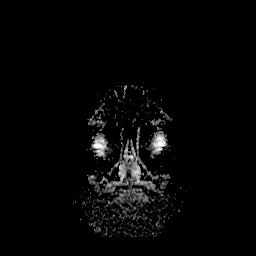
[im 18/36]
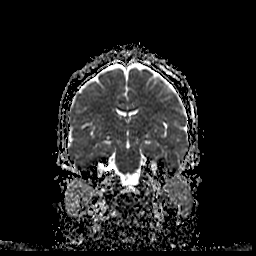
[im 36/36]
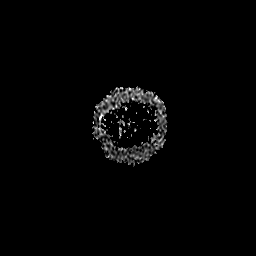

[34 of 48 positions shown; findings below may reference images not displayed]

FINDINGS: Brain: Several foci of restricted diffusion in the inferior right
cerebellum, the largest of which measures up to 1.6 cm (series 4,
image 12). These are associated with T2 hyperintense signal and
contrast enhancement (series 11, images 6-10). An additional area of
enhancement in the superior posterior right cerebellum (series 11,
image 14), is not associated with abnormal signal on
diffusion-weighted imaging, which has likely normalized. No other
abnormal enhancement.

No acute hemorrhage, mass, mass effect, or midline shift. No
hydrocephalus or extra-axial collection. No hemosiderin deposition
to suggest remote hemorrhage.

Vascular: Normal flow voids.

Skull and upper cervical spine: Normal marrow signal.

Sinuses/Orbits: Mucous retention cysts in the right maxillary sinus.
Mild mucosal thickening in the anterior ethmoid air cells. Otherwise
negative.

Other: The mastoids are well aerated.
IMPRESSION: 1. Acute infarcts in the inferior right cerebellum, in the PICA
territory, which demonstrate mildly increased signal on
diffusion-weighted imaging, contrast enhancement, and T2
hyperintensity.
2. Subacute infarct in the superior posterior right cerebellum, in
the SCA territory, with normalization on diffusion-weighted imaging
but contrast enhancement and T2 hyperintense signal. Given multiple
posterior vascular territories and different ages of infarcts,
consider a vertebral artery etiology; a CTA of the head and neck is
recommended.

These results were called by telephone at the time of interpretation
acknowledged these results.

## 2023-06-30 ENCOUNTER — Other Ambulatory Visit: Payer: Self-pay | Admitting: Orthopaedic Surgery

## 2023-06-30 DIAGNOSIS — S62174A Nondisplaced fracture of trapezium [larger multangular], right wrist, initial encounter for closed fracture: Secondary | ICD-10-CM

## 2023-07-01 ENCOUNTER — Other Ambulatory Visit: Payer: Self-pay | Admitting: Urology

## 2023-07-01 DIAGNOSIS — S62174A Nondisplaced fracture of trapezium [larger multangular], right wrist, initial encounter for closed fracture: Secondary | ICD-10-CM

## 2023-07-02 ENCOUNTER — Other Ambulatory Visit: Payer: Self-pay | Admitting: Urology

## 2023-07-02 ENCOUNTER — Ambulatory Visit
Admission: RE | Admit: 2023-07-02 | Discharge: 2023-07-02 | Disposition: A | Discharge status: Home / Self Care | Payer: BC Managed Care – PPO | Source: Ambulatory Visit | Attending: Urology | Admitting: Urology

## 2023-07-02 DIAGNOSIS — S62174A Nondisplaced fracture of trapezium [larger multangular], right wrist, initial encounter for closed fracture: Secondary | ICD-10-CM

## 2023-07-06 ENCOUNTER — Ambulatory Visit: Payer: PRIVATE HEALTH INSURANCE | Admitting: Orthopedic Surgery
